# Patient Record
Sex: Female | Born: 1980 | Race: White | Hispanic: No | Marital: Married | Smoking: Current every day smoker
Health system: Southern US, Community
[De-identification: ages and names within clinical notes are randomized; demographics above are authoritative.]

## PROBLEM LIST (undated history)

## (undated) DIAGNOSIS — R51 Headache: Secondary | ICD-10-CM

## (undated) DIAGNOSIS — IMO0002 Reserved for concepts with insufficient information to code with codable children: Secondary | ICD-10-CM

## (undated) DIAGNOSIS — D649 Anemia, unspecified: Secondary | ICD-10-CM

## (undated) DIAGNOSIS — F909 Attention-deficit hyperactivity disorder, unspecified type: Secondary | ICD-10-CM

## (undated) DIAGNOSIS — T4145XA Adverse effect of unspecified anesthetic, initial encounter: Secondary | ICD-10-CM

## (undated) DIAGNOSIS — IMO0001 Reserved for inherently not codable concepts without codable children: Secondary | ICD-10-CM

## (undated) DIAGNOSIS — R87619 Unspecified abnormal cytological findings in specimens from cervix uteri: Secondary | ICD-10-CM

## (undated) DIAGNOSIS — T8859XA Other complications of anesthesia, initial encounter: Secondary | ICD-10-CM

## (undated) DIAGNOSIS — Z5189 Encounter for other specified aftercare: Secondary | ICD-10-CM

## (undated) DIAGNOSIS — G971 Other reaction to spinal and lumbar puncture: Secondary | ICD-10-CM

## (undated) DIAGNOSIS — F419 Anxiety disorder, unspecified: Secondary | ICD-10-CM

## (undated) HISTORY — DX: Headache: R51

## (undated) HISTORY — DX: Other complications of anesthesia, initial encounter: T88.59XA

## (undated) HISTORY — DX: Anemia, unspecified: D64.9

## (undated) HISTORY — DX: Encounter for other specified aftercare: Z51.89

## (undated) HISTORY — DX: Reserved for inherently not codable concepts without codable children: IMO0001

## (undated) HISTORY — DX: Adverse effect of unspecified anesthetic, initial encounter: T41.45XA

## (undated) HISTORY — DX: Attention-deficit hyperactivity disorder, unspecified type: F90.9

## (undated) HISTORY — DX: Other reaction to spinal and lumbar puncture: G97.1

## (undated) HISTORY — DX: Reserved for concepts with insufficient information to code with codable children: IMO0002

## (undated) HISTORY — DX: Anxiety disorder, unspecified: F41.9

## (undated) HISTORY — DX: Unspecified abnormal cytological findings in specimens from cervix uteri: R87.619

---
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
## 2000-07-08 ENCOUNTER — Other Ambulatory Visit: Admission: RE | Admit: 2000-07-08 | Discharge: 2000-07-08 | Payer: Self-pay | Admitting: Obstetrics and Gynecology

## 2002-05-16 ENCOUNTER — Inpatient Hospital Stay (HOSPITAL_COMMUNITY): Admission: EM | Admit: 2002-05-16 | Discharge: 2002-05-18 | Payer: Self-pay | Admitting: Psychiatry

## 2002-06-01 ENCOUNTER — Emergency Department (HOSPITAL_COMMUNITY): Admission: AD | Admit: 2002-06-01 | Discharge: 2002-06-01 | Payer: Self-pay | Admitting: Emergency Medicine

## 2002-06-01 ENCOUNTER — Encounter: Payer: Self-pay | Admitting: Emergency Medicine

## 2006-09-17 ENCOUNTER — Ambulatory Visit: Payer: Self-pay | Admitting: Family Medicine

## 2006-09-17 ENCOUNTER — Encounter (INDEPENDENT_AMBULATORY_CARE_PROVIDER_SITE_OTHER): Payer: Self-pay | Admitting: Family Medicine

## 2006-09-17 LAB — CONVERTED CEMR LAB
Antibody Screen: NEGATIVE
Antibody Screen: NEGATIVE
Basophils Absolute: 0 10*3/uL (ref 0.0–0.1)
Basophils Relative: 0 % (ref 0–1)
Beta hcg, urine, semiquantitative: POSITIVE
Eosinophils Absolute: 0.1 10*3/uL (ref 0.0–0.7)
GC Culture Only: NEGATIVE
HCT: 42.8 % (ref 36.0–46.0)
Hepatitis B Surface Ag: NEGATIVE
Hepatitis B Surface Ag: NEGATIVE
Lymphocytes Relative: 21 % (ref 12–46)
Lymphs Abs: 1.9 10*3/uL (ref 0.7–3.3)
MCHC: 34.3 g/dL (ref 30.0–36.0)
MCV: 93.9 fL (ref 78.0–100.0)
Monocytes Relative: 7 % (ref 3–11)
Neutro Abs: 6.4 10*3/uL (ref 1.7–7.7)
Neutrophils Relative %: 71 % (ref 43–77)
RDW: 13.2 % (ref 11.5–14.0)
Rubella: 123.8 intl units/mL — ABNORMAL HIGH
Urine culture (CF units/mL): NEGATIVE CFunits/mL
WBC: 9 10*3/uL (ref 4.0–10.5)

## 2006-09-19 DIAGNOSIS — F411 Generalized anxiety disorder: Secondary | ICD-10-CM | POA: Insufficient documentation

## 2006-09-19 DIAGNOSIS — F329 Major depressive disorder, single episode, unspecified: Secondary | ICD-10-CM

## 2006-10-01 ENCOUNTER — Encounter (INDEPENDENT_AMBULATORY_CARE_PROVIDER_SITE_OTHER): Payer: Self-pay | Admitting: Family Medicine

## 2006-10-01 ENCOUNTER — Ambulatory Visit (HOSPITAL_COMMUNITY): Admission: RE | Admit: 2006-10-01 | Discharge: 2006-10-01 | Payer: Self-pay | Admitting: Sports Medicine

## 2006-10-21 ENCOUNTER — Telehealth (INDEPENDENT_AMBULATORY_CARE_PROVIDER_SITE_OTHER): Payer: Self-pay | Admitting: Family Medicine

## 2006-11-01 ENCOUNTER — Ambulatory Visit: Payer: Self-pay | Admitting: Family Medicine

## 2006-11-01 LAB — CONVERTED CEMR LAB

## 2006-11-11 ENCOUNTER — Ambulatory Visit: Payer: Self-pay | Admitting: Family Medicine

## 2006-11-11 ENCOUNTER — Telehealth: Payer: Self-pay | Admitting: *Deleted

## 2006-11-11 ENCOUNTER — Encounter (INDEPENDENT_AMBULATORY_CARE_PROVIDER_SITE_OTHER): Payer: Self-pay | Admitting: Family Medicine

## 2006-12-06 ENCOUNTER — Ambulatory Visit (HOSPITAL_COMMUNITY): Admission: RE | Admit: 2006-12-06 | Discharge: 2006-12-06 | Payer: Self-pay | Admitting: Family Medicine

## 2006-12-06 ENCOUNTER — Encounter (INDEPENDENT_AMBULATORY_CARE_PROVIDER_SITE_OTHER): Payer: Self-pay | Admitting: Family Medicine

## 2006-12-23 ENCOUNTER — Ambulatory Visit: Payer: Self-pay | Admitting: Family Medicine

## 2006-12-30 ENCOUNTER — Telehealth (INDEPENDENT_AMBULATORY_CARE_PROVIDER_SITE_OTHER): Payer: Self-pay | Admitting: Family Medicine

## 2007-01-05 ENCOUNTER — Encounter (INDEPENDENT_AMBULATORY_CARE_PROVIDER_SITE_OTHER): Payer: Self-pay | Admitting: Family Medicine

## 2007-01-16 ENCOUNTER — Ambulatory Visit: Payer: Self-pay | Admitting: Family Medicine

## 2007-01-31 ENCOUNTER — Encounter (INDEPENDENT_AMBULATORY_CARE_PROVIDER_SITE_OTHER): Payer: Self-pay | Admitting: *Deleted

## 2007-01-31 ENCOUNTER — Telehealth: Payer: Self-pay | Admitting: *Deleted

## 2007-02-10 ENCOUNTER — Ambulatory Visit: Payer: Self-pay | Admitting: Sports Medicine

## 2007-02-10 LAB — CONVERTED CEMR LAB

## 2007-02-11 ENCOUNTER — Telehealth (INDEPENDENT_AMBULATORY_CARE_PROVIDER_SITE_OTHER): Payer: Self-pay | Admitting: Family Medicine

## 2007-02-12 ENCOUNTER — Encounter (INDEPENDENT_AMBULATORY_CARE_PROVIDER_SITE_OTHER): Payer: Self-pay | Admitting: Family Medicine

## 2007-02-12 ENCOUNTER — Ambulatory Visit: Payer: Self-pay | Admitting: Family Medicine

## 2007-02-12 LAB — CONVERTED CEMR LAB: GTT, 1 hr: 125 mg/dL

## 2007-02-13 ENCOUNTER — Ambulatory Visit (HOSPITAL_COMMUNITY): Admission: RE | Admit: 2007-02-13 | Discharge: 2007-02-13 | Payer: Self-pay | Admitting: Family Medicine

## 2007-02-28 ENCOUNTER — Encounter (INDEPENDENT_AMBULATORY_CARE_PROVIDER_SITE_OTHER): Payer: Self-pay | Admitting: Family Medicine

## 2007-02-28 ENCOUNTER — Ambulatory Visit: Payer: Self-pay | Admitting: Family Medicine

## 2007-02-28 LAB — CONVERTED CEMR LAB: Hemoglobin: 12.5 g/dL

## 2007-03-24 ENCOUNTER — Ambulatory Visit: Payer: Self-pay | Admitting: Family Medicine

## 2007-04-02 ENCOUNTER — Ambulatory Visit: Payer: Self-pay | Admitting: Family Medicine

## 2007-04-09 ENCOUNTER — Inpatient Hospital Stay (HOSPITAL_COMMUNITY): Admission: AD | Admit: 2007-04-09 | Discharge: 2007-04-09 | Payer: Self-pay | Admitting: Obstetrics & Gynecology

## 2007-04-09 ENCOUNTER — Ambulatory Visit: Payer: Self-pay | Admitting: Family Medicine

## 2007-04-09 ENCOUNTER — Ambulatory Visit: Payer: Self-pay | Admitting: Family

## 2007-04-09 ENCOUNTER — Inpatient Hospital Stay (HOSPITAL_COMMUNITY): Admission: AD | Admit: 2007-04-09 | Discharge: 2007-04-11 | Payer: Self-pay | Admitting: Obstetrics & Gynecology

## 2007-04-09 ENCOUNTER — Encounter (INDEPENDENT_AMBULATORY_CARE_PROVIDER_SITE_OTHER): Payer: Self-pay | Admitting: Family Medicine

## 2007-04-09 LAB — CONVERTED CEMR LAB
Chlamydia, Swab/Urine, PCR: NEGATIVE
Glucose, Urine, Semiquant: NEGATIVE

## 2007-04-10 ENCOUNTER — Ambulatory Visit: Payer: Self-pay | Admitting: Psychiatry

## 2007-04-14 ENCOUNTER — Ambulatory Visit: Payer: Self-pay | Admitting: Obstetrics & Gynecology

## 2007-04-17 ENCOUNTER — Ambulatory Visit: Payer: Self-pay | Admitting: Gynecology

## 2007-04-21 ENCOUNTER — Ambulatory Visit: Payer: Self-pay | Admitting: Family Medicine

## 2007-04-23 ENCOUNTER — Ambulatory Visit: Payer: Self-pay | Admitting: Obstetrics and Gynecology

## 2007-04-24 ENCOUNTER — Ambulatory Visit: Payer: Self-pay | Admitting: Gynecology

## 2007-04-24 ENCOUNTER — Inpatient Hospital Stay (HOSPITAL_COMMUNITY): Admission: AD | Admit: 2007-04-24 | Discharge: 2007-04-27 | Payer: Self-pay | Admitting: Obstetrics & Gynecology

## 2007-04-28 ENCOUNTER — Telehealth (INDEPENDENT_AMBULATORY_CARE_PROVIDER_SITE_OTHER): Payer: Self-pay | Admitting: Family Medicine

## 2007-05-02 ENCOUNTER — Ambulatory Visit: Payer: Self-pay | Admitting: Family Medicine

## 2007-05-13 ENCOUNTER — Telehealth: Payer: Self-pay | Admitting: *Deleted

## 2007-05-20 ENCOUNTER — Encounter: Payer: Self-pay | Admitting: *Deleted

## 2007-06-02 ENCOUNTER — Telehealth: Payer: Self-pay | Admitting: *Deleted

## 2007-06-03 ENCOUNTER — Ambulatory Visit: Payer: Self-pay | Admitting: Family Medicine

## 2007-06-05 ENCOUNTER — Telehealth: Payer: Self-pay | Admitting: Psychology

## 2007-06-13 ENCOUNTER — Ambulatory Visit: Payer: Self-pay | Admitting: Family Medicine

## 2007-06-13 DIAGNOSIS — F909 Attention-deficit hyperactivity disorder, unspecified type: Secondary | ICD-10-CM | POA: Insufficient documentation

## 2007-06-17 ENCOUNTER — Ambulatory Visit: Payer: Self-pay | Admitting: Family Medicine

## 2007-06-18 ENCOUNTER — Telehealth (INDEPENDENT_AMBULATORY_CARE_PROVIDER_SITE_OTHER): Payer: Self-pay | Admitting: Family Medicine

## 2007-06-23 ENCOUNTER — Telehealth: Payer: Self-pay | Admitting: Psychology

## 2007-06-27 ENCOUNTER — Ambulatory Visit: Payer: Self-pay | Admitting: Family Medicine

## 2007-07-04 ENCOUNTER — Emergency Department (HOSPITAL_COMMUNITY): Admission: EM | Admit: 2007-07-04 | Discharge: 2007-07-04 | Payer: Self-pay | Admitting: Emergency Medicine

## 2007-07-07 ENCOUNTER — Ambulatory Visit: Payer: Self-pay | Admitting: Psychology

## 2007-07-07 ENCOUNTER — Telehealth (INDEPENDENT_AMBULATORY_CARE_PROVIDER_SITE_OTHER): Payer: Self-pay | Admitting: Family Medicine

## 2007-07-08 ENCOUNTER — Encounter: Payer: Self-pay | Admitting: *Deleted

## 2007-07-10 ENCOUNTER — Encounter (INDEPENDENT_AMBULATORY_CARE_PROVIDER_SITE_OTHER): Payer: Self-pay | Admitting: *Deleted

## 2007-07-28 ENCOUNTER — Telehealth: Payer: Self-pay | Admitting: Psychology

## 2007-09-04 ENCOUNTER — Telehealth: Payer: Self-pay | Admitting: *Deleted

## 2007-09-09 ENCOUNTER — Telehealth (INDEPENDENT_AMBULATORY_CARE_PROVIDER_SITE_OTHER): Payer: Self-pay | Admitting: *Deleted

## 2007-09-15 ENCOUNTER — Ambulatory Visit: Payer: Self-pay | Admitting: Family Medicine

## 2007-09-15 LAB — CONVERTED CEMR LAB: Whiff Test: NEGATIVE

## 2007-12-19 ENCOUNTER — Encounter (INDEPENDENT_AMBULATORY_CARE_PROVIDER_SITE_OTHER): Payer: Self-pay | Admitting: *Deleted

## 2008-07-14 ENCOUNTER — Encounter: Payer: Self-pay | Admitting: *Deleted

## 2008-07-28 ENCOUNTER — Telehealth: Payer: Self-pay | Admitting: Family Medicine

## 2008-09-01 ENCOUNTER — Ambulatory Visit: Payer: Self-pay | Admitting: Family Medicine

## 2008-09-01 DIAGNOSIS — I951 Orthostatic hypotension: Secondary | ICD-10-CM

## 2008-09-01 DIAGNOSIS — H6690 Otitis media, unspecified, unspecified ear: Secondary | ICD-10-CM | POA: Insufficient documentation

## 2008-09-01 DIAGNOSIS — B3749 Other urogenital candidiasis: Secondary | ICD-10-CM | POA: Insufficient documentation

## 2008-09-03 ENCOUNTER — Telehealth: Payer: Self-pay | Admitting: Family Medicine

## 2008-09-13 ENCOUNTER — Telehealth (INDEPENDENT_AMBULATORY_CARE_PROVIDER_SITE_OTHER): Payer: Self-pay | Admitting: *Deleted

## 2008-11-04 ENCOUNTER — Encounter: Payer: Self-pay | Admitting: Family Medicine

## 2008-12-17 ENCOUNTER — Encounter: Payer: Self-pay | Admitting: Family Medicine

## 2009-01-29 HISTORY — PX: TONSILLECTOMY: SUR1361

## 2009-09-12 ENCOUNTER — Encounter: Payer: Self-pay | Admitting: Family Medicine

## 2009-09-12 ENCOUNTER — Ambulatory Visit: Payer: Self-pay | Admitting: Family Medicine

## 2009-09-12 DIAGNOSIS — N920 Excessive and frequent menstruation with regular cycle: Secondary | ICD-10-CM

## 2009-09-12 DIAGNOSIS — N63 Unspecified lump in unspecified breast: Secondary | ICD-10-CM

## 2009-09-12 LAB — CONVERTED CEMR LAB
Hemoglobin: 14.6 g/dL (ref 12.0–15.0)
MCHC: 33.8 g/dL (ref 30.0–36.0)
MCV: 92.3 fL (ref 78.0–100.0)
Platelets: 245 10*3/uL (ref 150–400)
WBC: 12.5 10*3/uL — ABNORMAL HIGH (ref 4.0–10.5)

## 2009-09-19 ENCOUNTER — Telehealth: Payer: Self-pay | Admitting: Family Medicine

## 2009-09-19 ENCOUNTER — Encounter: Admission: RE | Admit: 2009-09-19 | Discharge: 2009-09-19 | Payer: Self-pay | Admitting: Internal Medicine

## 2009-09-26 ENCOUNTER — Encounter: Admission: RE | Admit: 2009-09-26 | Discharge: 2009-09-26 | Payer: Self-pay | Admitting: Internal Medicine

## 2009-10-10 ENCOUNTER — Ambulatory Visit: Payer: Self-pay | Admitting: Family Medicine

## 2009-10-10 DIAGNOSIS — R350 Frequency of micturition: Secondary | ICD-10-CM | POA: Insufficient documentation

## 2009-11-11 ENCOUNTER — Encounter: Payer: Self-pay | Admitting: Family Medicine

## 2010-02-19 ENCOUNTER — Encounter: Payer: Self-pay | Admitting: *Deleted

## 2010-02-20 ENCOUNTER — Encounter: Payer: Self-pay | Admitting: Family Medicine

## 2010-02-21 ENCOUNTER — Telehealth: Payer: Self-pay | Admitting: Family Medicine

## 2010-02-22 ENCOUNTER — Ambulatory Visit: Admission: RE | Admit: 2010-02-22 | Discharge: 2010-02-22 | Payer: Self-pay | Source: Home / Self Care

## 2010-02-22 DIAGNOSIS — K5900 Constipation, unspecified: Secondary | ICD-10-CM | POA: Insufficient documentation

## 2010-02-22 DIAGNOSIS — O039 Complete or unspecified spontaneous abortion without complication: Secondary | ICD-10-CM | POA: Insufficient documentation

## 2010-02-22 DIAGNOSIS — R109 Unspecified abdominal pain: Secondary | ICD-10-CM | POA: Insufficient documentation

## 2010-02-22 DIAGNOSIS — R197 Diarrhea, unspecified: Secondary | ICD-10-CM | POA: Insufficient documentation

## 2010-02-22 LAB — CONVERTED CEMR LAB
Beta hcg, urine, semiquantitative: NEGATIVE
Blood in Urine, dipstick: NEGATIVE
Nitrite: NEGATIVE
Specific Gravity, Urine: 1.02
Urobilinogen, UA: 1
pH: 7.5

## 2010-02-28 NOTE — Miscellaneous (Signed)
Summary: walk in  Clinical Lists Changes walked in with her mom. concerned about a grape sized lump in r breast x 3-4 months. sore at times. no change in size. located near axilla in ruq.  hx aniety disorder per pt. sees a psychiatrist. on Vyvance & alprazolam. wants a referral to md for this. asked if she could get an ultrasound today. told her if md ordered, we may be able to get it done today or tomorrow.  states she has been afrid to come & wants her mom with her. mom is off on mondays. says she has missed appts due to her fear. she got a probation letter & says she will not miss more appts.   also concerned about bleeding gums, unexplained bruising & wt loss. states she has been on a diet & has been exercising. the gums bleed off & on. not always when she brushes her teeth.   also wants to change practices. told her to find another one & sign a ROI so the new md will have her data. she has medicaid.  appt today at 3:15 with Dr. Denyse Amass.Marland KitchenMarland KitchenGolden Circle RN  September 12, 2009 9:27 AM  noted. Jamie Brookes MD  September 12, 2009 8:31 PM

## 2010-02-28 NOTE — Progress Notes (Signed)
Summary: results  Phone Note Call from Patient Call back at Home Phone [phone]   Caller: Patient Summary of Call: pt is wanting to know results of labs Initial call taken by: De Nurse,  September 19, 2009 9:01 AM  Follow-up for Phone Call        will forward to MD. Follow-up by: Theresia Lo RN,  September 19, 2009 10:53 AM  Additional Follow-up for Phone Call Additional follow up Details #1::        she was concerned about platelets. told her they were fine. she wants to know when she will have her gyn appt. states the referral was made but she has not heard back yet. told her sometimes the office we ask to see her may not have anything immediately. assured her we will call when we hear back about day & time Additional Follow-up by: Golden Circle RN,  September 19, 2009 11:23 AM    Additional Follow-up for Phone Call Additional follow up Details #2::    Called left message.  Follow-up by: Clementeen Graham MD,  September 20, 2009 2:25 PM

## 2010-02-28 NOTE — Assessment & Plan Note (Signed)
Summary: anxiety, PREGNANT pos   Vital Signs:  Patient profile:   30 year old female Height:      64 inches Weight:      135 pounds BMI:     23.26 Temp:     98.4 degrees F oral Pulse rate:   103 / minute BP sitting:   113 / 75  (left arm) Cuff size:   regular  Vitals Entered By: Tessie Fass CMA (October 10, 2009 9:04 AM) CC: pregnancy test Pain Assessment Patient in pain? no        Primary Care Mauria Asquith:  Jamie Brookes MD  CC:  pregnancy test.  History of Present Illness: Pregnancy test: Pt is having some symptoms of fatigue and feeling overwhelmed. She has irregular periods and is concerned she may be pregnant. Thinks LMP was the middle of the month. She is havign some hand and fet sweeling in the mornings. She is having some insomnia (especially when she is going through PMS). She had some concerns about a breast lump which [facility] and then had biops[facility]. It was found to be fibrocystic tissue but they may still want to take it out. She's waiting to hear back from those MD's. She is exercising but still feels tired, she has a lot of anxiety and is taking Xanax every 4-6 hours. She is having increased urinary frequency.    Habits & Providers  Alcohol-Tobacco-Diet     Tobacco Status: current  Allergies: 1)  ! Penicillin 2)  ! Keflex  Social History: Smokes 5-10  cigs/day down from over a ppd. quit w/ pregancy.  No alcohol or drugs.  Engaged  with supportive father. Lives w/ her mother.  Her father mysteriously left with family money last year...gambling problemSmoking Status:  current  Review of Systems        vitals reviewed and pertinent negatives and positives seen in HPI   Physical Exam  General:  Well-developed,well-nourished,in no acute distress; alert,appropriate and cooperative throughout examination, appears anxious Lungs:  Normal respiratory effort, chest expands symmetrically. Lungs are clear to auscultation, no  crackles or wheezes. Heart:  Tachycardia, S1 and S2 normal without gallop, murmur, click, rub or other extra sounds. Psych:  pt appears very anxious but has good eye contact   Impression & Recommendations:  Problem # 1:  ANXIETY (ICD-300.00) Assessment Deteriorated Pt appears very anxious today. She is taking xanax quite often. She is seeing and working with her therapist and psychologist.   Orders: [facility]- Est  Level 4 ([address]01)  Her updated medication list for this problem includes:    Alprazolam 1 Mg Tabs (Alprazolam) .Marland Kitchen... 1 mg by mouth three times a day as needed anxiety  Problem # 2:  FREQUENCY, URINARY (ICD-788.41) Assessment: New Pt is urinateing frequenclty and found to be PREGNANT today. She is excited about the news. She already has an appointment with Dr. Silverio Lay, OB/Gyn for thsi week. Encouraged to keep that appointment.    Orders: U Preg-[facility] (81025) [facility]- Est  Level 4 ([address]214)  Complete Medication List: 1)  Alprazolam 1 Mg Tabs (Alprazolam) .Marland Kitchen.. 1 mg by mouth three times a day as needed anxiety 2)  Fluconazole 150 Mg Tabs (Fluconazole) .Marland Kitchen.. 1 tab by mouth, take second pill if not resolving in 4 days 3)  Mucinex 600 Mg Xr12h-tab (Guaifenesin) .... Take one pill every 12hours as needed for congestion. 4)  Azithromycin 500 Mg Tabs (Azithromycin) .... Take on pill daily for 5 days. 5)  Proventil Hfa 108 (90 Base) Mcg/act Aers (Albuterol sulfate) .... Use every 4 hours as needed for wheezing 6)  Vyvanse 50 Mg Caps (Lisdexamfetamine dimesylate) .Marland Kitchen.. 1 per day (may increase to 60 mg) 7)  Naproxen 375 Mg Tabs (Naproxen) .... Taek 1 pill every 8-12 hours. 8)  Hydrocortisone 2.5 % Crea (Hydrocortisone) .... Apply to skin twice a day when having flairs of ezcema.  1 large tube  Patient Instructions: 1)  It was nice to see you again today.  2)  I'm glad your biopsy went well. Please have them send me results. 3)  We are testing you for pregnancy today.   Prescriptions: HYDROCORTISONE 2.5 % CREA (HYDROCORTISONE) apply to skin twice a day when having flairs of ezcema.  1 large tube  #1 x 3   Entered and Authorized by:   Jamie Brookes MD   Signed by:   Jamie Brookes MD on 10/10/2009   Method used:   Electronically to        [facility] [phone]* (retail)       [address]       [address]       [place], [place]  [address]       Ph: [phone]       Fax: [phone]   RxID:   [id number] NAPROXEN 375 MG TABS (NAPROXEN) taek 1 pill every 8-12 hours.  #90 x 6   Entered and Authorized by:   Jamie Brookes MD   Signed by:   Jamie Brookes MD on 10/10/2009   Method used:   Electronically to        [facility] [phone]* (retail)       [address]       [address]       [place], [place]  [address]       Ph: [phone]       Fax: [phone]   RxID:   [id number]   Laboratory Results   Urine Tests  Date/Time Received: October 10, 2009 9:46 AM  Date/Time Reported: October 10, 2009 9:52 AM     Urine HCG: positive Comments: ...............test performed by......Marland KitchenBonnie A. [place], MLS (ASCP)cm

## 2010-02-28 NOTE — Assessment & Plan Note (Signed)
Summary: see notes/[name]/strother   Vital Signs:  Patient profile:   30 year old female Height:      64 inches Weight:      137 pounds BMI:     23.60 Temp:     98.3 degrees F oral BP sitting:   108 / 78  (right arm) Cuff size:   regular  Vitals Entered By: Tessie Fass CMA (September 12, 2009 3:15 PM) CC: lump in right breast Pain Assessment Patient in pain? no        Primary Care Provider:  Jamie Brookes MD  CC:  lump in right breast.  History of Present Illness: Lump in right breast for over 6 months. Not really associated with menstural cycles. Not growing.  Not painful, and no lymphnodes in [place]. Feels Ok otherwise.   Also notes easy brusing and fatigue recently.  Notes heavy cycles and some gum bleeding with teeth cleening intermintently.   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 1 Mg By Mouth Three Times A Day As Needed Anxiety 2)  Adderall 30 Mg Tabs (Amphetamine-Dextroamphetamine) .... May Take Up To 80 Mg Daily As Needed Per Psychiatrist. 3)  Fluconazole 150 Mg  Tabs (Fluconazole) .Marland Kitchen.. 1 Tab By Mouth, Take Second Pill If Not Resolving in 4 Days 4)  Lamictal 200 Mg Tabs (Lamotrigine) .... Take 350 Mg By Mouth Qday 5)  Mucinex 600 Mg Xr12h-Tab (Guaifenesin) .... Take One Pill Every 12hours As Needed For Congestion. 6)  Azithromycin 500 Mg Tabs (Azithromycin) .... Take On Pill Daily For 5 Days. 7)  Proventil Hfa 108 (90 Base) Mcg/act Aers (Albuterol Sulfate) .... Use Every 4 Hours As Needed For Wheezing  Allergies (verified): 1)  ! Penicillin 2)  ! Keflex  Past History:  Past Medical  History: Last updated: 09/17/2006 Anxiety Depression  Family History: Last updated: 09/17/2006 no health problems  Social History: Last updated: 05/02/2007 Smokes 5 cigs/day down from over a ppd. quit w/ pregancy.  No alcohol or drugs.  Engaged  with supportive father. Lives w/ her mother.  Her father mysteriously left with family money last year...gambling problem  Review of Systems  The patient denies anorexia, fever, weight loss, decreased hearing, hoarseness, chest pain, syncope, dyspnea on exertion, peripheral edema, prolonged cough, headaches, abdominal pain, severe indigestion/heartburn, hematuria, genital sores, muscle weakness, depression, unusual weight change, and enlarged lymph nodes.    Physical Exam  General:  Vs noted. Well NAD Mouth:  Oral mucosa and oropharynx without lesions or exudates.  Teeth in good repair. Neck:  No deformities, masses, or tenderness noted. Breasts:  Right lateral breast with rubbery mobile 1cm mass. Non tender. No lymph node adenopathy. Lungs:  Normal respiratory effort, chest expands symmetrically. Lungs are clear to auscultation, no crackles or wheezes. Heart:  Normal rate and regular rhythm. S1 and S2 normal without gallop, murmur, click, rub or other extra sounds. Abdomen:  Bowel sounds positive,abdomen soft and non-tender without masses, organomegaly or hernias noted. Extremities:  Non edemetus BL LE Cervical Nodes:  No lymphadenopathy noted Axillary Nodes:  No palpable lymphadenopathy   Impression & Recommendations:  Problem # 1:  BREAST MASS, RIGHT (  ZOX-096.04) Assessment New Likely fibrocystic changes in young woman with no FH for breast or ovarian cancer.  Conservative plan for ultrasound of breast +/- mamogram and guided biopsy.  Will follow up with PCP.   Orders: Ultrasound (Ultrasound) [facility]- Est Level  3 ([address]98)  Problem # 2:  MENORRHAGIA (ICD-626.2) Assessment: Unchanged ? Von Willibraun's disease.  Will get CBC now to  confirm normal platelet numbers. May work this issue up further.  Will follow up with PCP.   Orders: CBC-[facility] (11914) Gynecologic Referral (Gyn)  Complete Medication List: 1)  Alprazolam 1 Mg Tabs (Alprazolam) .Marland Kitchen.. 1 mg by mouth three times a day as needed anxiety 2)  Adderall 30 Mg Tabs (Amphetamine-dextroamphetamine) .... May take up to 80 mg daily as needed per psychiatrist. 3)  Fluconazole 150 Mg Tabs (Fluconazole) .Marland Kitchen.. 1 tab by mouth, take second pill if not resolving in 4 days 4)  Lamictal 200 Mg Tabs (Lamotrigine) .... Take 350 mg by mouth qday 5)  Mucinex 600 Mg Xr12h-tab (Guaifenesin) .... Take one pill every 12hours as needed for congestion. 6)  Azithromycin 500 Mg Tabs (Azithromycin) .... Take on pill daily for 5 days. 7)  Proventil Hfa 108 (90 Base) Mcg/act Aers (Albuterol sulfate) .... Use every 4 hours as needed for wheezing  Patient Instructions: 1)  Thank you for seeing me today. 2)  Please make a follow up appt with Dr. Clotilde Dieter in 1 month 3)  I will call with test results I will leave a message. 4)  Please get your ultrasound soon.  Prescriptions: PROVENTIL HFA 108 (90 BASE) MCG/ACT AERS (ALBUTEROL SULFATE) use every 4 hours as needed for wheezing  #1 x 0   Entered and Authorized by:   Clementeen Graham MD   Signed by:   Clementeen Graham MD on 09/12/2009   Method used:   Electronically to        [facility] [phone]* (retail)       [address]       [address]       [place], [place]  [address]       Ph: [phone]       Fax: [phone]   RxID:   [id number]

## 2010-02-28 NOTE — Miscellaneous (Signed)
Summary: NPI to [facility]  Clinical Lists Changes gave Angie at above office [phone]) the NPI for her pregnancy care thru delivery & 6 wk check.Marland KitchenMarland KitchenGolden Circle RN  November 11, 2009 11:50 AM

## 2010-03-02 NOTE — Progress Notes (Signed)
Summary: phn msg  Phone Note Call from Patient Call back at Home Phone [phone]   Caller: Patient Summary of Call: wants to go to [facility] for abd pain- has an appt tomorrow with them but since she has medicaid, she needs NPI.  told her that we could get her in today or tomorrow here, but she is insisiting that she go there. Initial call taken by: De Nurse,  February 21, 2010 9:03 AM  Follow-up for Phone Call        will forward message to MD to give OK for this referral. Follow-up by: Theresia Lo RN,  February 21, 2010 9:32 AM  Additional Follow-up for Phone Call Additional follow up Details #1::        pt called back and made appt with [place] Balthazar for tomorrow afternoon Additional Follow-up by: De Nurse,  February 21, 2010 9:35 AM    Additional Follow-up for Phone Call Additional follow up Details #2::    will be happy to see her.  Follow-up by: Jamie Brookes MD,  February 21, 2010 2:28 PM

## 2010-03-06 ENCOUNTER — Telehealth: Payer: Self-pay | Admitting: Family Medicine

## 2010-03-08 NOTE — Assessment & Plan Note (Signed)
Summary: abd pain,df   Vital Signs:  Patient profile:   30 year old female Height:      64 inches Weight:      146.7 pounds BMI:     25.27 Temp:     97.7 degrees F oral Pulse rate:   82 / minute BP sitting:   107 / 69  (left arm) Cuff size:   regular  Vitals Entered By: Jimmy Footman, CMA (February 22, 2010 3:31 PM) CC: abdominal pai Is Patient Diabetic? No   Primary Care Provider:  Jamie Brookes MD  CC:  abdominal pai.  History of Present Illness: Abd Pain: Pt has had some abdominal pain. She recently had a miscarriage and was being seen by Dr. Estanislado Pandy (Ob/Gyn). She was told that she is doing well after the miscarriage and can go back to everyday normal activity. She was playing with her daughter 3 days ago as she always does and her daughter was jumping on her stomach. She noted some left sided pain after this but says she doesn't think it caused it because they always play like that and it has never caused pain. She also notes some changing bowel habits. She is concerned about IBS.   Habits & Providers  Alcohol-Tobacco-Diet     Tobacco Status: quit  Current Medications (verified): 1)  Alprazolam 1 Mg  Tabs (Alprazolam) .Marland Kitchen.. 1 Mg By Mouth Three Times A Day As Needed Anxiety 2)  Fluconazole 150 Mg  Tabs (Fluconazole) .Marland Kitchen.. 1 Tab By Mouth, Take Second Pill If Not Resolving in 4 Days 3)  Mucinex 600 Mg Xr12h-Tab (Guaifenesin) .... Take One Pill Every 12hours As Needed For Congestion. 4)  Azithromycin 500 Mg Tabs (Azithromycin) .... Take On Pill Daily For 5 Days. 5)  Proventil Hfa 108 (90 Base) Mcg/act Aers (Albuterol Sulfate) .... Use Every 4 Hours As Needed For Wheezing 6)  Vyvanse 50 Mg Caps (Lisdexamfetamine Dimesylate) .Marland Kitchen.. 1 Per Day (May Increase To 60 Mg) 7)  Naproxen 375 Mg Tabs (Naproxen) .... Taek 1 Pill Every 8-12 Hours. 8)  Hydrocortisone 2.5 % Crea (Hydrocortisone) .... Apply To Skin Twice A Day When Having Flairs of Ezcema.  1 Large Tube  Allergies (verified): 1)  !  Penicillin 2)  ! Keflex  Social History: Smoking Status:  quit  Review of Systems       pt notes that she feels like her feet and hands are swollen.    Physical Exam  General:  Well-developed,well-nourished,in no acute distress; alert,appropriate and cooperative throughout examination Abdomen:  soft, normal bowel sounds, no distention, and no masses.  Pt did not have significant area of focal tenderness, some diffuse tenderness. No bruising.  Msk:  no swelling noted in hand or feet.  Psych:  moderately anxious.     Impression & Recommendations:  Problem # 1:  ABDOMINAL PAIN, LOWER (ICD-789.09) Assessment New Pt has a UA that is neg. This may be IBS pain. Will refer to GI per pt request.   Her updated medication list for this problem includes:    Naproxen 375 Mg Tabs (Naproxen) .Marland Kitchen... Taek 1 pill every 8-12 hours.  Orders: Urinalysis-[facility] (00000) U Preg-[facility] (04540) Gastroenterology Referral (GI) [facility]- Est Level  3 (98119)  Problem # 2:  CONSTIPATION (ICD-564.00) Assessment: Comment Only Pt ha constipation sometimes and sometimes diarrhea. Likely related to iBS.   Orders: Gastroenterology Referral (GI) [facility]- Est Level  3 (14782)  Problem # 3:  DIARRHEA (ICD-787.91) Assessment: Comment Only Pt sometimes has diarrhea. Likely related  to  IBS. Will refer to GI.   Orders: Gastroenterology Referral (GI) [facility]- Est Level  3 (04540)  Problem # 4:  UNSPEC SPONTANEOUS AB WITHOUT MENTION COMP (ICD-634.90) Assessment: Unchanged PT had recent pregnancy loss. But is doing well and was under the care of Dr. Estanislado Pandy.  Pt had neg Upreg here today. She would like to go back to her Dr. Estanislado Pandy Ob/Gyn but needs an official referral to continue seeing her. Done today.   Orders: Obstetric Referral (Obstetric) [facility]- Est Level  3 (98119)  Complete Medication List: 1)  Alprazolam 1 Mg Tabs (Alprazolam) .Marland Kitchen.. 1 mg by mouth three times a day as needed anxiety 2)  Fluconazole 150 Mg Tabs  (Fluconazole) .Marland Kitchen.. 1 tab by mouth, take second pill if not resolving in 4 days 3)  Mucinex 600 Mg Xr12h-tab (Guaifenesin) .... Take one pill every 12hours as needed for congestion. 4)  Azithromycin 500 Mg Tabs (Azithromycin) .... Take on pill daily for 5 days. 5)  Proventil Hfa 108 (90 Base) Mcg/act Aers (Albuterol sulfate) .... Use every 4 hours as needed for wheezing 6)  Vyvanse 50 Mg Caps (Lisdexamfetamine dimesylate) .Marland Kitchen.. 1 per day (may increase to 60 mg) 7)  Naproxen 375 Mg Tabs (Naproxen) .... Taek 1 pill every 8-12 hours. 8)  Hydrocortisone 2.5 % Crea (Hydrocortisone) .... Apply to skin twice a day when having flairs of ezcema.  1 large tube  Patient Instructions: 1)  I have sent in a referral for your OB/Gyn doctor.  2)  I will call you with results of your urine tests.    Orders Added: 1)  Urinalysis-[facility] [00000] 2)  U Preg-[facility] [81025] 3)  Obstetric Referral [Obstetric] 4)  Gastroenterology Referral [GI] 5)  [facility]- Est Level  3 [99213]    Laboratory Results   Urine Tests  Date/Time Received: February 22, 2010 3:57 PM  Date/Time Reported: February 22, 2010 5:30 PM   Routine Urinalysis   Color: yellow Appearance: Clear Glucose: negative   (Normal Range: Negative) Bilirubin: small   (Normal Range: Negative) Ketone: trace (5)   (Normal Range: Negative) Spec. Gravity: 1.020   (Normal Range: 1.003-1.035) Blood: negative   (Normal Range: Negative) pH: 7.5   (Normal Range: 5.0-8.0) Protein: 100   (Normal Range: Negative) Urobilinogen: 1.0   (Normal Range: 0-1) Nitrite: negative   (Normal Range: Negative) Leukocyte Esterace: negative   (Normal Range: Negative)    Urine HCG: negative Comments: micro not done,  bilirubin not confirmed ...............test performed by......Marland KitchenMajel Homer MD

## 2010-03-16 NOTE — Progress Notes (Signed)
Summary: [facility] appointment  Phone Note From Other Clinic   Caller: [facility] Summary of Call: called to say that patient [facility] for today Initial call taken by: De Nurse,  March 06, 2010 11:24 AM  Follow-up for Phone Call        I noted it in the referral section.  Follow-up by: Jamie Brookes MD,  March 06, 2010 12:15 PM

## 2010-06-13 NOTE — Consult Note (Signed)
NAME:  [name], LOUD              ACCOUNT NO.:  [id number]   MEDICAL RECORD NO.:  [id number]          PATIENT TYPE:  INP   LOCATION:  9157                          FACILITY:  [facility]   PHYSICIAN:  Anselm Jungling, MD  DATE OF BIRTH:  [birth year]   DATE OF CONSULTATION:  04/10/2007  DATE OF DISCHARGE:                                 CONSULTATION   IDENTIFYING DATA AND REASON FOR REFERRAL:  The patient is a 30 year old  Caucasian woman who is currently [redacted] weeks pregnant.  She is admitted to  [facility] because of complications related to her pregnancy.  There is apparently some need for observation, 24-hour urine collection,  and monitoring.  In the course of her evaluation and assessment here,  the patient has exhibited some anxiety, and it is come out that she has  a reported history of bipolar disorder.  A psychiatric consultation is  requested to review all of this, examine the patient and make  recommendations.   HISTORY OF THE PRESENTING PROBLEMS:  The patient herself is the source  of most of the following information.  She appears to be a good  historian.  The patient is 30 years old, and indicates that she has  reached her 36th week of gestation today.  She indicates that when she  was younger, she was diagnosed with various psychiatric problems  including bipolar disorder, ADHD.  In addition, older records indicate  that she has a history of substance abuse, and eating disorder.   On the electronic medical record, I find records pertaining to an  inpatient psychiatric stay here at [facility] that  occurred in April 2004, about 5 years ago, when the patient was 73.  At  that time she was admitted for 3 days due to increasing depression and  suicidal ideation.  The patient identified herself as having bipolar  disorder, but it was something that she designated herself with, after  researching it on the Internet.  she was not diagnosed with bipolar  disorder  during that hospital stay.  She was seen as having a  nonspecific substance abuse disorder, a nonspecific eating disorder, and  borderline personality disorder.  Nonetheless, she has been treated with  a variety psychotropic medications over time including Lexapro, Xanax,  and Adderall.   The patient tells me, with some pride, that she has gotten off of all of  those medications, and no longer uses any alcohol, drugs, and no longer  smokes.  She is very proud of herself.  She states that she has done  well without all of these medications, and feels that they had a lot of  negative and deleterious effects when she was using them.  She states  now they think that maybe I do not even have bipolar.   She does acknowledge some panic attacks which do not sound by  description like full-blown panic attacks, but rather episodes of  anxiety.  Earlier today, she was given a p.r.n. dose of Vistaril 50 mg.  The patient states that this helped a lot.Marland Kitchen   PAST PSYCHIATRIC HISTORY:  As above.   MEDICAL HISTORY:  Please refer to OB/GYN evaluation.   MENTAL STATUS AND OBSERVATIONS:  The patient is a well-nourished, well-  developed, young adult female who is resting comfortably in her hospital  bed.  She is alert, fully oriented, and well-organized.  She states that  she knew that a psychiatrist was coming, and is gracious about speaking  with me.  She is quite open and unguarded and appears to be a good  historian. She is a least average intelligence.   She shares her opinion with me that I am probably going to have some  more episodes of anxiety.  However, she indicates that this is  something that she feels she can take in stride.  She indicates quite  clearly that she does not want to necessarily be treated with much if  anything in the way of medication for this.  She states you know Xanax  can be  very addictive.  She goes on to tell me about the negative  effects that she feels the past  psychotropic medications have had,  including dental problems that she feels are the result of having used  Adderall.   Thoughts and speech are normally organized.  There is nothing to suggest  any underlying thought disorder, psychosis, or delusionality. Her mood  appears to be neutral, with appropriate affect.  Her level of insight  and judgment appear to be quite good.  It appears that she is looking  forward to giving birth to the daughter, whom she has already named.  She indicates that she is living with her mother, who sounds like she is  a solid support.   IMPRESSION:  The patient is a 30 year old woman who is currently [redacted]  weeks pregnant.  Based upon my review of the records and my discussion  with the patient, I do not feel that there is any convincing evidence  that she has, or ever had a true bipolar disorder.  She apparently has  had some issues with substance abuse and binging and purging in the  past, but those problems do not appear to be in evidence at this time.  When she was hospitalized at [facility] psychiatrically 5 years ago, she  was felt to have an unspecified mood disorder, but with an AXIS II  specifier of borderline personality disorder.  She has apparently done  quite well in her life in recent years without psychotropic medications.   I believe that the brief periods of anxiety she is experiencing  currently are not necessarily indicative of any diagnosable psychiatric  disorder.  The patient herself prefers to not characterize these  episodes as indicative of a psychiatric disturbance.  She appears to  have a fairly mature, matter-of-fact view towards her current episodes  of anxiety.  She is grateful for some relief from Vistaril, but does not  appear to be seeking a medication solution to this and indicates healthy  tolerance for a certain amount of anxiety that she may experience.   DIAGNOSTIC IMPRESSION:  AXIS I:  Adjustment disorder with mild  anxious  features.  AXIS II:  Deferred.  AXIS III:  [redacted] weeks gestation.  AXIS IV:  Stressors moderate.  AXIS V:  GAF 85.   RECOMMENDATIONS:  I feel it is appropriate to continue p.r.n. Vistaril  for mild episodes of anxiety that she is experiencing.  The patient also  tells me that Ambien is being considered for sleep and this would appear  to be reasonable as well.   I do not think that she needs referral to any outside psychiatric or  mental health  providers at this time, based upon what I have learned  about her to day.   Thank you for involving me in this patient's care.  Please do not  hesitate to contact me if further input is needed.      Anselm Jungling, MD  Electronically Signed     SPB/MEDQ  D:  04/10/2007  T:  04/10/2007  Job:  [id number]

## 2010-06-13 NOTE — Discharge Summary (Signed)
[name], [name]              ACCOUNT NO.:  [id number]   Brittney RECORD NO.:  [id number]          PATIENT TYPE:  INP   LOCATION:  9147                          FACILITY:  [facility]   PHYSICIAN:  Allie Bossier, MD        DATE OF BIRTH:  April 03, 1980   DATE OF ADMISSION:  04/24/2007  DATE OF DISCHARGE:  04/27/2007                               DISCHARGE SUMMARY   DISCHARGE MEDICATIONS:  1. Ibuprofen 600 mg p.o. q.6 h. p.r.n.  2. Colace 100 mg p.o. b.i.d.  3. Dermaflex spray use as directed.  4. ProctoFoam use as directed.  5. Prenatal vitamins 1 tablet p.o. daily while breast-feeding.  6. Fioricet as prescribed per anesthesia.   DISCHARGE DIAGNOSES:  1. Term pregnancy.  2. Postpartum day #2.  3. Anxiety.  4. Complications with spinal anesthesia.   HOSPITAL SUMMARY:  This is a 30 year old female G2, P60, who presented at  62 weeks' gestation, who was called from [name] after having  more than 300 mg of protein in her urine and elevated liver enzymes.  The patient had been seen at the [name] for presumed preeclampsia.  On  the April 24, 2007, abnormal labs were found and the patient returned to  be induced for preeclampsia.  The patient did well.  During the hospital  stay, she was placed on magnesium sulfate.  Liver enzymes were  rechecked.  The patient tolerated magnesium sulfate and had no  complications with this.  Initially, Cytotec was placed and then a Foley  bulb to assist with induction.  The patient was then started on Pitocin  in the morning of the April 25, 2007.  The patient delivered on April 25, 2007, without any complications with the exception of a second-  degree laceration.  A healthy baby girl was born with Apgars 9 and 9.  A  3-cord vessel, normal placenta.  Laceration repaired with 3-0 Vicryl.  Mother and baby healthy.  Please note that the anesthesia used was  intended to be an  epidural; however, it was then said is spinal  anesthesia.  Initially, the patient had some complications with losing  feeling from the neck down; however, she had no respiratory issues.  The  spinal anesthesia was readjusted and the patient tolerated the remainder  of her labor with the spinal anesthesia.  Postpartum, the patient was  continued on magnesium sulfate for 24 hours and then she was transferred  to the floor.  Her blood pressures stayed in the 130s/80s to 90s.  On  the day of discharge which was April 27, 2007, the patient was noted to  have a severe headache that was positional.  It only occurred while the  patient was sitting up and disappeared completely when she was lying  down, it was felt to be secondary to her spinal anesthesia.  Anesthesiology was consulted who offered to patch the patient; however,  she refused this.  Instead, they gave her Fioricet.  The patient agreed  to this plan.  Of note, during the hospital, the patient was found to be  anemic with hemoglobin down to 7.7; however, there was no obvious blood  loss and the patient remained asymptomatic.  No transfusion needed,  although the patient was typed and crossed.   PERTINENT LABORATORY DATA:  On admission, comprehensive metabolic panel  showed liver enzymes; AST 58, ALT 104, urine over 300 mg/dL of protein.  CBC:  Prepartum hemoglobin 13, postpartum 8.1, and platelets remained  normal at 198.  RPR negative.   FOLLOWUP:  The patient has to follow up with Dr. Karn Pickler at the [facility] in 1 week.  As the patient has a history of anxiety and  depression, I felt close followup was warranted.  She will then be  continued to be followed.  Baby Love Nurse will check on his blood  pressure at the house.      Johney Maine, M.D.      Allie Bossier, MD  Electronically Signed    JT/MEDQ  D:  05/03/2007  T:  05/03/2007  Job:  [id number]

## 2010-06-13 NOTE — Discharge Summary (Signed)
[name], [name]              ACCOUNT NO.:  [id number]   MEDICAL RECORD NO.:  [id number]         PATIENT TYPE:  INP   LOCATION:  9157                          FACILITY:  [facility]   PHYSICIAN:  Norton Blizzard, MD    DATE OF BIRTH:  [birth year]   DATE OF ADMISSION:  04/09/2007  DATE OF DISCHARGE:  04/11/2007                               DISCHARGE SUMMARY   ADMISSION DIAGNOSES:  1. Intrauterine pregnancy at 35 weeks' and 6 days gestation.  2. Proteinuria.  3. Elevated ALT.  4. Bilateral lower extremity edema.  5. Borderline low platelets.  6. Assessment for preeclampsia.  7. Anxiety   DISCHARGE DIAGNOSES:  1. Intrauterine pregnancy at 36 weeks and 1 day.  2. Gestational hypertension.  3. Proteinuria.  4. Mildly elevated ALT improved from admission.  5. Group B streptococcus, unknown.  6. Low platelets.  7. Anxiety.   PROCEDURES:  The patient had an ultrasound performed on April 10, 2007,  showing a single gestation, cephalic presentation, placenta was anterior  above the cervical os.  Amniotic fluid index was 17.06 in the 63rd  percentile.  Estimated fetal weight was 3044 grams in the 79th  percentile.  No late developing fetal anatomical abnormalities were  seen.   COMPLICATIONS:  None.   CONSULTATIONS:  Psychiatry - Dr Electa Sniff   PERTINENT LABORATORY FINDINGS:  Upon admission, [name] had a CBC  showing white blood cell count of 11.3, hemoglobin  13.4, hematocrit  37.8, and platelets 164.  Urinalysis had 100 of protein, 15 ketones  small, and bilirubin negative for nitrites and leukocyte esterase.  Complete metabolic panel, sodium was 134, potassium 3.5, BUN 5,  creatinine 0.46, glucose was 77, AST was 34, ALT was 61, and alkaline  phosphatase 145.  Reminder of labs were within normal limits.  Admission  LDH was 163 and uric acid was 6.1.  She had a 24-hour urine during her  stay with urine volume of 825 mL.  A 24-hour urine protein of 264.  Creatinine clearance of  159.  A 24-hour urine creatinine of 1165.  On  the day of discharge, her CBC showed a white blood cell count of 7.9,  hemoglobin 12.2, hematocrit 34.9, and platelets 146.  Complete metabolic  panel, sodium was 140, potassium 3.3, creatinine 0.47, alkaline  phosphatase 123, AST was 27, ALT was 55, LDH was 132, and uric acid was  5.5.   BRIEF/PERTINENT ADMISSION HISTORY:  [name] is a 30 year old gravida  3, para 0-0-2-0 at 35 weeks and 6 days gestation with history of 2  therapeutic abortions.  She presents complaining of elevated blood  pressures.  She also reports 5-pound weight gain in a week.  There was  no headache, vision changes, or right upper quadrant epigastric pain.  Blood pressure in MAU was 123/83 followed by 130/80.  She was noted to  have normal reflexes, no clonus and bilateral 2+ pitting edema in her  lower extremities.  Platelets were found to be 164, which were in a low  normal range.  ALT was elevated at 61.   HOSPITAL COURSE:  The  patient was admitted for evaluation of possible  preeclampsia due to reported history of weight gain and elevated blood  pressures.  The blood pressures were normal here.  She also had an  elevated ALT and mildly low borderline platelets.  The patient was  admitted to antenatal and 24-hour urine for protein, creatinine was  begun.  Ultrasound for growth and AFI was also ordered.  Ultrasound  reports as stated above with estimated fetal weight in the 79th  percentile and AFI of 17.  She was monitored with NSTs, which were  reactive throughout her stay.  A 24-hour urine protein was 264 mg, and  she had no signs or symptoms throughout her stay.  She had 3+ deep  tendon reflexes without clonus, and is otherwise asymptomatic.  She is  to be discharged home with followup in [facility] for twice weekly  testing. The patient was also assessed by psychiatry due to the history  of bipolar disorder and tearful episode upon admission.  She  was  diagnosed with episodes of anxiety and counseled to use Vistaril p.r.n.   DISCHARGE STATUS:  Stable.   DISCHARGE MEDICATIONS:  1. Prenatal vitamins 1 tablet p.o. daily, as she was taking prior to      admission.  2. Vistaril 50 mg one PO evert 6-8 hours as needed for anxiety   DISCHARGE INSTRUCTIONS:  1. Discharged to home.  2. Regular diet.  3. Activity is to be limited to using the bathroom, showering, and      being up to eat.  No restrictions on sexual activity.  4. The patient is to follow up in [facility], an appointment has      been made for April 14, 2007, at 7:30 a.m. for an NST and a visit      with physician.      Karlton Lemon, MD      Norton Blizzard, MD  Electronically Signed    NS/MEDQ  D:  04/11/2007  T:  04/12/2007  Job:  [id number]   cc:   Johney Maine, M.D.
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
## 2010-06-28 ENCOUNTER — Encounter: Payer: Self-pay | Admitting: Family Medicine

## 2010-06-28 ENCOUNTER — Telehealth: Payer: Self-pay | Admitting: Family Medicine

## 2010-06-28 ENCOUNTER — Ambulatory Visit (INDEPENDENT_AMBULATORY_CARE_PROVIDER_SITE_OTHER): Payer: Medicaid Other | Admitting: Family Medicine

## 2010-06-28 VITALS — BP 110/79 | HR 85 | Temp 97.9°F | Ht 65.0 in | Wt 134.0 lb

## 2010-06-28 DIAGNOSIS — N912 Amenorrhea, unspecified: Secondary | ICD-10-CM

## 2010-06-28 DIAGNOSIS — G4700 Insomnia, unspecified: Secondary | ICD-10-CM | POA: Insufficient documentation

## 2010-06-28 DIAGNOSIS — J4520 Mild intermittent asthma, uncomplicated: Secondary | ICD-10-CM | POA: Insufficient documentation

## 2010-06-28 DIAGNOSIS — Z309 Encounter for contraceptive management, unspecified: Secondary | ICD-10-CM

## 2010-06-28 DIAGNOSIS — J45909 Unspecified asthma, uncomplicated: Secondary | ICD-10-CM | POA: Insufficient documentation

## 2010-06-28 LAB — POCT URINE PREGNANCY: Preg Test, Ur: POSITIVE

## 2010-06-28 MED ORDER — CLONAZEPAM 1 MG PO TABS: 1.0000 mg | ORAL_TABLET | Freq: Three times a day (TID) | ORAL | Status: DC

## 2010-06-28 MED ORDER — ALBUTEROL SULFATE HFA 108 (90 BASE) MCG/ACT IN AERS: 1.0000 | INHALATION_SPRAY | RESPIRATORY_TRACT | Status: DC | PRN

## 2010-06-28 MED ORDER — ZOLPIDEM TARTRATE 5 MG PO TABS: 5.0000 mg | ORAL_TABLET | Freq: Every evening | ORAL | Status: DC | PRN

## 2010-06-28 NOTE — Progress Notes (Signed)
Addended by: Jamie Brookes B on: 06/28/2010 05:05 PM   Modules accepted: Orders

## 2010-06-28 NOTE — Assessment & Plan Note (Signed)
PT does not want to be pregnant. Has an unstable relationship at this time.  Plans to have an abortion. Given resources today.  Upreg was positive.

## 2010-06-28 NOTE — Telephone Encounter (Signed)
Pt calling to let MD know she has a pregnancy termination scheduled this Monday at 3:00, they have requested she f/u with obgyn for an ultrasound within 3 days, pt wants to know if we can do the referral? She has been seen at  [facility] in the past.

## 2010-06-28 NOTE — Patient Instructions (Signed)
You are pregnant.  Based on your LMP of 05-20-10 you are 5 weeks and 3 days pregnant.  You have been given info about places to terminate a pregnancy.  I refilled your albuterol and gave you a Rx for ambien.  Use it only at night.

## 2010-06-28 NOTE — Telephone Encounter (Signed)
I put in the order for the [place] [facility]. If she prefers to go the [facility] let me know and I will put it in there. She should be able to get pregnancy mediciad to cover the ultrasound. I am in [place] clinic on Thursday so just come find me if we can't get her in quickly at the [facility]. Thanks for working on this so quickly.

## 2010-06-28 NOTE — Progress Notes (Signed)
Amenorrhea: Pt has no had her period since 05-20-10 and took a home pregnancy test that was positive. She is here today to confirm the pregnancy and to discuss termination. She and her fiance are separated right now because he has started drinking again. He just got out of rehab. She is living with her mom and her daughter at her mom's house. She recently had a miscarriage and had not gotten back on birth control and is now pregnant again. She says it is just not a good time to be pregnant since she isn't sure where the relationship is going. She hopes they will work it out. According to her dates she is 5 weeks and 3 days today.   Insomnia: Pt says that she has not slept in 3 days. She is not sleeping well despite being on Klonopin. Pt has tried melatonin. Wants to discuss a sleeping pill. Has used ambien in the past while she was pregnant with her daughter Aileen Pilot.   ROS: some dizziness and bruising and some fainting spells. She has a referral to neurology for this already and has been working with her [facility] doctor about this.   PE:  Gen: Pt is tearful Pscyh: Pt is anxious and tearful. She appears to be rational in her thinking.

## 2010-06-29 ENCOUNTER — Telehealth: Payer: Self-pay | Admitting: *Deleted

## 2010-06-29 ENCOUNTER — Other Ambulatory Visit: Payer: Self-pay | Admitting: Family Medicine

## 2010-06-29 DIAGNOSIS — O3680X0 Pregnancy with inconclusive fetal viability, not applicable or unspecified: Secondary | ICD-10-CM

## 2010-06-29 DIAGNOSIS — N912 Amenorrhea, unspecified: Secondary | ICD-10-CM

## 2010-06-29 NOTE — Telephone Encounter (Signed)
Spoke with [facility] and gave information. I was informed that request has to be sent back to nurse and then it will be set up. Gave phone number of patient so she can be contacted. Also gave my extension if needing more information or if not able to reach patient.

## 2010-06-29 NOTE — Telephone Encounter (Signed)
Ok, I put in the referral for the Ob/Gyn. Please set it up. Thanks.

## 2010-06-30 ENCOUNTER — Ambulatory Visit (HOSPITAL_COMMUNITY): Admission: RE | Admit: 2010-06-30 | Payer: Medicaid Other | Source: Ambulatory Visit

## 2010-06-30 ENCOUNTER — Other Ambulatory Visit (HOSPITAL_COMMUNITY): Payer: Medicaid Other

## 2010-06-30 NOTE — Telephone Encounter (Signed)
Spoke with patient and she informed me that she also received call from [facility] office.[name]

## 2010-06-30 NOTE — Telephone Encounter (Signed)
Set up appointment for 07/06/2010 @ 9:30am @ [facility] Appointment is set up with the PA, and I was informed by the nurse that the doctor is the one to decide if the [place] is necessary. The office was informed of why [place] was requested.

## 2010-07-03 NOTE — Telephone Encounter (Signed)
Spoke with patient.

## 2010-10-23 LAB — CBC
HCT: 34.9 — ABNORMAL LOW
HCT: 37.8
HCT: 21.5 — ABNORMAL LOW
HCT: 23.3 — ABNORMAL LOW
Hemoglobin: 13.4
Hemoglobin: 7.7 — CL
Hemoglobin: 8.1 — ABNORMAL LOW
MCHC: 35.3
MCHC: 35.1
MCHC: 35.9
MCV: 89.9
MCV: 90.5
MCV: 89.3
Platelets: 146 — ABNORMAL LOW
Platelets: 198
RBC: 4.2
RBC: 4.37
RBC: 2.41 — ABNORMAL LOW
RBC: 2.59 — ABNORMAL LOW
RBC: 4.15
RDW: 12.9
RDW: 12.8
RDW: 13
WBC: 7.9
WBC: 9.9
WBC: 10.8 — ABNORMAL HIGH

## 2010-10-23 LAB — COMPREHENSIVE METABOLIC PANEL
ALT: 66 — ABNORMAL HIGH
ALT: 104 — ABNORMAL HIGH
AST: 36
AST: 58 — ABNORMAL HIGH
Albumin: 2 — ABNORMAL LOW
Albumin: 2.5 — ABNORMAL LOW
Albumin: 2.7 — ABNORMAL LOW
Alkaline Phosphatase: 142 — ABNORMAL HIGH
BUN: 3 — ABNORMAL LOW
BUN: 5 — ABNORMAL LOW
CO2: 20
CO2: 24
CO2: 25
CO2: 22
Calcium: 8.5
Calcium: 9.1
Chloride: 104
Chloride: 107
Chloride: 111
Chloride: 108
Creatinine, Ser: 0.46
Creatinine, Ser: 0.47
GFR calc Af Amer: >60
GFR calc Af Amer: >60
GFR calc non Af Amer: >60
GFR calc non Af Amer: >60
GFR calc non Af Amer: >60
GFR calc non Af Amer: >60
Glucose, Bld: 83
Sodium: 138
Sodium: 138
Total Bilirubin: 0.5
Total Bilirubin: 0.7
Total Bilirubin: 0.9

## 2010-10-23 LAB — URINALYSIS, ROUTINE W REFLEX MICROSCOPIC
Hgb urine dipstick: NEGATIVE
Nitrite: NEGATIVE
Protein, ur: 100 — AB
Specific Gravity, Urine: >1.03 — ABNORMAL HIGH
Urobilinogen, UA: 0.2

## 2010-10-23 LAB — TYPE AND SCREEN: Antibody Screen: NEGATIVE

## 2010-10-23 LAB — BASIC METABOLIC PANEL
CO2: 22
Chloride: 105
GFR calc Af Amer: >60
Glucose, Bld: 81
Potassium: 3.4 — ABNORMAL LOW
Sodium: 135

## 2010-10-23 LAB — DIFFERENTIAL
Basophils Absolute: 0
Basophils Absolute: 0
Basophils Absolute: 0
Basophils Relative: 0
Eosinophils Absolute: 0.1
Eosinophils Relative: 1
Eosinophils Relative: 1
Lymphocytes Relative: 24
Monocytes Absolute: 0.7
Monocytes Relative: 6
Neutro Abs: 5.3
Neutro Abs: 8.8 — ABNORMAL HIGH
Neutrophils Relative %: 66

## 2010-10-23 LAB — POCT URINALYSIS DIP (DEVICE)
Glucose, UA: NEGATIVE
Glucose, UA: NEGATIVE
Nitrite: NEGATIVE
Nitrite: NEGATIVE
Operator id: 194561
Operator id: 297281
Protein, ur: >=300 — AB
Urobilinogen, UA: 0.2
Urobilinogen, UA: 0.2

## 2010-10-23 LAB — LACTATE DEHYDROGENASE
LDH: 132
LDH: 163

## 2010-10-23 LAB — CREATININE CLEARANCE, URINE, 24 HOUR
Creatinine Clearance: 159 — ABNORMAL HIGH
Creatinine, Urine: 141.2
Creatinine: 0.51

## 2010-10-23 LAB — URINE MICROSCOPIC-ADD ON

## 2010-10-23 LAB — URIC ACID
Uric Acid, Serum: 5.4
Uric Acid, Serum: 5.5

## 2010-10-23 LAB — PROTEIN, URINE, 24 HOUR: Urine Total Volume-UPROT: 825

## 2010-10-26 LAB — CBC
HCT: 39.6
MCHC: 33.7
MCV: 86.9
Platelets: 206
WBC: 6.8

## 2010-10-26 LAB — DIFFERENTIAL
Basophils Relative: 0
Eosinophils Absolute: 0.2
Eosinophils Relative: 2
Lymphs Abs: 1.9
Monocytes Relative: 6
Neutrophils Relative %: 63

## 2010-10-26 LAB — POCT I-STAT, CHEM 8
Calcium, Ion: 1.12
Creatinine, Ser: 0.9
Glucose, Bld: 91
HCT: 40
Hemoglobin: 13.6
TCO2: 23

## 2010-10-26 LAB — URINALYSIS, ROUTINE W REFLEX MICROSCOPIC
Nitrite: NEGATIVE
Protein, ur: NEGATIVE
Specific Gravity, Urine: 1.01
Urobilinogen, UA: 0.2

## 2010-10-26 LAB — WET PREP, GENITAL
Clue Cells Wet Prep HPF POC: NONE SEEN
Trich, Wet Prep: NONE SEEN

## 2010-10-26 LAB — GC/CHLAMYDIA PROBE AMP, GENITAL
Chlamydia, DNA Probe: NEGATIVE
GC Probe Amp, Genital: NEGATIVE

## 2010-10-26 LAB — POCT PREGNANCY, URINE: Preg Test, Ur: NEGATIVE

## 2010-10-26 LAB — URINE MICROSCOPIC-ADD ON

## 2011-01-30 NOTE — L&D Delivery Note (Signed)
Delivery Note At 11:58 PM a viable female was delivered via Vaginal, Spontaneous Delivery (Presentation: Middle Occiput Anterior).  Easy delivery of the head, easy delivery of the shoulders, bulb suction mouth and nares, baby placed on pt abdomen cord doubly clamped and cut by support person. APGAR: 8, 9; weight .   Placenta status: Intact, Spontaneous.  Cord: 3 vessels   Anesthesia: Epidural  Episiotomy: None Lacerations: 2nd degree Suture Repair: 3.0 and 4.0 monocryl for good hemostasis Est. Blood Loss (mL): 150  Mom to postpartum.  Baby to rooming in States can take percocet and motrin with no side effects.Marland Kitchen  Sekou Zuckerman 10/28/2011, 12:33 AM

## 2011-02-12 ENCOUNTER — Ambulatory Visit (INDEPENDENT_AMBULATORY_CARE_PROVIDER_SITE_OTHER): Payer: Medicaid Other | Admitting: Family Medicine

## 2011-02-12 ENCOUNTER — Encounter: Payer: Self-pay | Admitting: Family Medicine

## 2011-02-12 VITALS — BP 111/73 | HR 84 | Temp 97.7°F | Ht 65.0 in | Wt 141.0 lb

## 2011-02-12 DIAGNOSIS — B373 Candidiasis of vulva and vagina: Secondary | ICD-10-CM

## 2011-02-12 DIAGNOSIS — Z3200 Encounter for pregnancy test, result unknown: Secondary | ICD-10-CM

## 2011-02-12 DIAGNOSIS — Z23 Encounter for immunization: Secondary | ICD-10-CM

## 2011-02-12 DIAGNOSIS — B3731 Acute candidiasis of vulva and vagina: Secondary | ICD-10-CM

## 2011-02-12 DIAGNOSIS — N920 Excessive and frequent menstruation with regular cycle: Secondary | ICD-10-CM

## 2011-02-12 MED ORDER — FLUCONAZOLE 150 MG PO TABS: 150.0000 mg | ORAL_TABLET | Freq: Once | ORAL | Status: AC

## 2011-02-12 NOTE — Patient Instructions (Signed)
It was nice to meet you. I sent Diflucan to your pharmacy. Please schedule an annual complete physical with Dr. Tye Savoy at your earliest convenience. Good luck!

## 2011-02-12 NOTE — Assessment & Plan Note (Signed)
Symptoms resolving after Monistat.  Will give Diflucan PRN if symptoms worsen.

## 2011-02-12 NOTE — Progress Notes (Signed)
  Subjective:    Patient ID: [name], female    DOB: Oct 12, 1980, 31 y.o.   MRN: [id number]  HPI  Patient presents to clinic for pregnancy test.  She has been trying to get pregnant for 2-3 months.  LMP 01/20/2011.  Patient has not missed a period yet but she states that her periods have been irregular.  Currently takes a Women's vitamin.  Additionally, she took Monistat for a presumed yeast infection a few days ago.  Symptoms of itchiness, vaginal rash, and discharge have improved after taking Monistat.  Patient does not wish to be tested for yeast today; she is more concerned about her pregnancy test.  Review of Systems Denies any fever, chills, night sweats, abdominal pain.  Endorses fatigue and nausea, but no emesis.    Objective:   Physical Exam  Constitutional: No distress.  Cardiovascular: Normal rate, regular rhythm and normal heart sounds.   No murmur heard. Pulmonary/Chest: Effort normal and breath sounds normal.  Abdominal: Soft. Bowel sounds are normal. She exhibits no distension. There is no tenderness.  Neurological: She is alert.  Skin: Skin is warm. No rash noted.          Assessment & Plan:

## 2011-03-16 ENCOUNTER — Ambulatory Visit: Payer: Medicaid Other | Admitting: Family Medicine

## 2011-04-02 ENCOUNTER — Other Ambulatory Visit: Payer: Medicaid Other

## 2011-04-02 ENCOUNTER — Encounter (INDEPENDENT_AMBULATORY_CARE_PROVIDER_SITE_OTHER): Payer: Medicaid Other | Admitting: Registered Nurse

## 2011-04-02 DIAGNOSIS — O26849 Uterine size-date discrepancy, unspecified trimester: Secondary | ICD-10-CM

## 2011-04-06 ENCOUNTER — Encounter: Payer: Medicaid Other | Admitting: Obstetrics and Gynecology

## 2011-04-13 ENCOUNTER — Encounter: Payer: Medicaid Other | Admitting: Obstetrics and Gynecology

## 2011-04-19 ENCOUNTER — Encounter (INDEPENDENT_AMBULATORY_CARE_PROVIDER_SITE_OTHER): Payer: Medicaid Other | Admitting: Obstetrics and Gynecology

## 2011-04-19 DIAGNOSIS — Z3200 Encounter for pregnancy test, result unknown: Secondary | ICD-10-CM

## 2011-04-25 LAB — OB RESULTS CONSOLE HEPATITIS B SURFACE ANTIGEN: Hepatitis B Surface Ag: NEGATIVE

## 2011-04-25 LAB — OB RESULTS CONSOLE HIV ANTIBODY (ROUTINE TESTING): HIV: NONREACTIVE

## 2011-04-25 LAB — OB RESULTS CONSOLE HGB/HCT, BLOOD: HCT: 42 %

## 2011-04-25 LAB — OB RESULTS CONSOLE RPR: RPR: NONREACTIVE

## 2011-05-08 ENCOUNTER — Ambulatory Visit (INDEPENDENT_AMBULATORY_CARE_PROVIDER_SITE_OTHER): Payer: Medicaid Other | Admitting: Obstetrics and Gynecology

## 2011-05-08 ENCOUNTER — Encounter: Payer: Self-pay | Admitting: Obstetrics and Gynecology

## 2011-05-08 VITALS — BP 102/52 | Wt 157.0 lb

## 2011-05-08 DIAGNOSIS — J069 Acute upper respiratory infection, unspecified: Secondary | ICD-10-CM | POA: Insufficient documentation

## 2011-05-08 DIAGNOSIS — Z331 Pregnant state, incidental: Secondary | ICD-10-CM

## 2011-05-08 DIAGNOSIS — Z124 Encounter for screening for malignant neoplasm of cervix: Secondary | ICD-10-CM

## 2011-05-08 MED ORDER — ONDANSETRON 8 MG PO TBDP: 8.0000 mg | ORAL_TABLET | Freq: Three times a day (TID) | ORAL | Status: AC | PRN

## 2011-05-08 MED ORDER — ERYTHROMYCIN BASE 500 MG PO TABS: 250.0000 mg | ORAL_TABLET | Freq: Four times a day (QID) | ORAL | Status: AC

## 2011-05-08 NOTE — Progress Notes (Signed)
Trace of protein Pt had pressure No vaginal discharge BMI: 26.1 Chem 10: SG- 1.025 Ph- 5 Protein- Trace

## 2011-05-08 NOTE — Progress Notes (Signed)
  Subjective:    [name] is being seen today for her first obstetrical visit.  This  a planned pregnancy. She is at 14 4/[redacted] weeks gestation. Her obstetrical history is significant for hx PIH, pp hemorrhage. Relationship with FOB: spouse, living together. Patient does intend to breast feed. Pregnancy history fully reviewed.   Review of Systems:   Review of Systems  Constitutional: Negative.   HENT: Negative.        HEENT WNL with except crusty nasal drainage and nasal turbinates reddened edematous  Eyes: Negative.   Respiratory: Cough: with stuffy nose, yellow drainage, cough,   Cardiovascular: Negative.   Gastrointestinal: Negative.   Genitourinary: Negative.   Musculoskeletal: Negative.   Neurological: Negative.   Hematological: Negative.   Psychiatric/Behavioral: Negative.     Objective:     BP 102/52  Wt 157 lb (71.215 kg)  LMP 01/16/2011 Physical Exam  Constitutional: She is oriented to person, place, and time. She appears well-nourished.  HENT:  Head: Normocephalic.  Neck: Neck supple. No thyromegaly present.  Respiratory: Effort normal and breath sounds normal.  Genitourinary: Vagina normal and uterus normal.  Neurological: She is alert and oriented to person, place, and time. She has normal reflexes.  Skin: Skin is warm and dry.  Psychiatric: She has a normal mood and affect.    Maternal Exam:  Abdomen: Patient reports no abdominal tenderness. Introitus: Normal vulva. Normal vagina.  Ferning test: not done.  Nitrazine test: not done. Amniotic fluid character: not assessed.  Pelvis: adequate for delivery.   Cervix: Cervix evaluated by sterile speculum exam and digital exam.        Assessment:  14 4/7 week IUP  Pregnancy: G4P1 Patient Active Problem List  Diagnoses  . ANXIETY  . DEPRESSION  . ADHD  . HYPOTENSION-ORTHOSTATIC  . BREAST MASS, RIGHT  . MENORRHAGIA  . FREQUENCY, URINARY  . CONSTIPATION  . UNSPEC SPONTANEOUS AB WITHOUT MENTION  COMP  . DIARRHEA  . ABDOMINAL PAIN, LOWER  . Asthma  . Insomnia  . Amenorrhea  . Encounter for pregnancy test  . Vaginal yeast infection  . Upper respiratory infection       Plan:     Initial labs drawn. Prenatal vitamins. Problem list reviewed and updated. AFP3 discussed: requested. Role of ultrasound in pregnancy discussed; fetal survey: requested. Amniocentesis discussed: not indicated2. Follow up in 2 weeks lab,. plan US anatomy 4 weeks, RX zpack, zofran discussed Tierrah Anastos, [facility] 05/08/2011

## 2011-05-08 NOTE — Patient Instructions (Addendum)

## 2011-05-09 LAB — OB RESULTS CONSOLE GC/CHLAMYDIA: Gonorrhea: NEGATIVE

## 2011-05-10 LAB — CULTURE, OB URINE: Colony Count: >=100000

## 2011-05-10 LAB — PAP IG, CT-NG, RFX HPV ASCU: GC Probe Amp: NEGATIVE

## 2011-05-29 ENCOUNTER — Encounter: Payer: Self-pay | Admitting: Obstetrics and Gynecology

## 2011-05-29 ENCOUNTER — Ambulatory Visit (INDEPENDENT_AMBULATORY_CARE_PROVIDER_SITE_OTHER): Payer: Medicaid Other | Admitting: Obstetrics and Gynecology

## 2011-05-29 ENCOUNTER — Ambulatory Visit (INDEPENDENT_AMBULATORY_CARE_PROVIDER_SITE_OTHER): Payer: Medicaid Other

## 2011-05-29 VITALS — BP 104/64 | Wt 160.0 lb

## 2011-05-29 DIAGNOSIS — Z331 Pregnant state, incidental: Secondary | ICD-10-CM

## 2011-05-29 DIAGNOSIS — Z3689 Encounter for other specified antenatal screening: Secondary | ICD-10-CM

## 2011-05-29 LAB — US OB COMP + 14 WK

## 2011-05-29 NOTE — Progress Notes (Signed)
Ultrasound shows:  SIUP  S=D     [place] EDD: c/w dates            AFI: normal           Cervical length: 3.5 cm           Placenta localization: posterior           Fetal presentation: breech                    Anatomy survey is normal           Gender : female Pt left before being seen

## 2011-06-13 ENCOUNTER — Encounter: Payer: Self-pay | Admitting: Obstetrics and Gynecology

## 2011-06-13 DIAGNOSIS — O09299 Supervision of pregnancy with other poor reproductive or obstetric history, unspecified trimester: Secondary | ICD-10-CM

## 2011-06-13 DIAGNOSIS — Z9289 Personal history of other medical treatment: Secondary | ICD-10-CM

## 2011-06-14 ENCOUNTER — Ambulatory Visit (INDEPENDENT_AMBULATORY_CARE_PROVIDER_SITE_OTHER): Payer: Medicaid Other | Admitting: Obstetrics and Gynecology

## 2011-06-14 VITALS — BP 100/60 | Temp 98.1°F | Wt 167.0 lb

## 2011-06-14 DIAGNOSIS — Z888 Allergy status to other drugs, medicaments and biological substances status: Secondary | ICD-10-CM

## 2011-06-14 DIAGNOSIS — Z331 Pregnant state, incidental: Secondary | ICD-10-CM

## 2011-06-14 DIAGNOSIS — Z880 Allergy status to penicillin: Secondary | ICD-10-CM

## 2011-06-14 DIAGNOSIS — T360X5A Adverse effect of penicillins, initial encounter: Secondary | ICD-10-CM

## 2011-06-14 DIAGNOSIS — J019 Acute sinusitis, unspecified: Secondary | ICD-10-CM

## 2011-06-14 MED ORDER — AZITHROMYCIN 250 MG PO TABS: ORAL_TABLET | ORAL | Status: AC

## 2011-06-14 NOTE — Progress Notes (Signed)
Well.Sinus infection treated with erythromycin. Still C/O green drainage with pressure. Will start Zyrtec, Mucinex and Z-Pack Quad screen declined

## 2011-06-14 NOTE — Progress Notes (Signed)
Pt has no complaints except fels sick and rundown from sinus infection/ JM

## 2011-06-26 ENCOUNTER — Telehealth: Payer: Self-pay | Admitting: Obstetrics and Gynecology

## 2011-06-26 NOTE — Telephone Encounter (Signed)
TC from pt.   States is having difficulty sleeping x 1wk.  Sleeping only 2-3 hrs/night.  Has tried Tylenol Pm and Unisom with no improvement.   Took Ambien during last pregnancy.  Stats is so tired feels as though she is having slight hallucinations.   Has Hx bi-polar and generalized anxiety.  Denies homcidal or suicidal thoughts.   Per [facility] scheduled for eval 06/27/11 with DR VPH.

## 2011-06-26 NOTE — Telephone Encounter (Signed)
Triage/epic 

## 2011-06-26 NOTE — Telephone Encounter (Signed)
TC to pt. LM to return call regarding message. 

## 2011-06-27 ENCOUNTER — Encounter: Payer: Medicaid Other | Admitting: Obstetrics and Gynecology

## 2011-06-28 ENCOUNTER — Encounter (HOSPITAL_COMMUNITY): Payer: Self-pay | Admitting: Cardiology

## 2011-06-28 ENCOUNTER — Emergency Department (HOSPITAL_COMMUNITY)
Admission: EM | Admit: 2011-06-28 | Discharge: 2011-06-28 | Disposition: A | Discharge status: Home / Self Care | Payer: Medicaid Other | Attending: Emergency Medicine | Admitting: Emergency Medicine

## 2011-06-28 DIAGNOSIS — J45909 Unspecified asthma, uncomplicated: Secondary | ICD-10-CM | POA: Insufficient documentation

## 2011-06-28 DIAGNOSIS — Z880 Allergy status to penicillin: Secondary | ICD-10-CM

## 2011-06-28 DIAGNOSIS — O9989 Other specified diseases and conditions complicating pregnancy, childbirth and the puerperium: Secondary | ICD-10-CM | POA: Insufficient documentation

## 2011-06-28 DIAGNOSIS — R109 Unspecified abdominal pain: Secondary | ICD-10-CM | POA: Insufficient documentation

## 2011-06-28 DIAGNOSIS — K5900 Constipation, unspecified: Secondary | ICD-10-CM | POA: Insufficient documentation

## 2011-06-28 DIAGNOSIS — M545 Low back pain, unspecified: Secondary | ICD-10-CM | POA: Insufficient documentation

## 2011-06-28 DIAGNOSIS — T360X5A Adverse effect of penicillins, initial encounter: Secondary | ICD-10-CM | POA: Insufficient documentation

## 2011-06-28 DIAGNOSIS — Z888 Allergy status to other drugs, medicaments and biological substances status: Secondary | ICD-10-CM | POA: Insufficient documentation

## 2011-06-28 DIAGNOSIS — R112 Nausea with vomiting, unspecified: Secondary | ICD-10-CM | POA: Insufficient documentation

## 2011-06-28 LAB — URINALYSIS, ROUTINE W REFLEX MICROSCOPIC
Bilirubin Urine: NEGATIVE
Hgb urine dipstick: NEGATIVE
Ketones, ur: 15 mg/dL — AB
Specific Gravity, Urine: 1.013 (ref 1.005–1.030)
Urobilinogen, UA: 0.2 mg/dL (ref 0.0–1.0)

## 2011-06-28 LAB — BASIC METABOLIC PANEL
CO2: 18 mEq/L — ABNORMAL LOW (ref 19–32)
Chloride: 102 mEq/L (ref 96–112)
Creatinine, Ser: 0.41 mg/dL — ABNORMAL LOW (ref 0.50–1.10)
Potassium: 3.8 mEq/L (ref 3.5–5.1)

## 2011-06-28 LAB — CBC
HCT: 36.5 % (ref 36.0–46.0)
Hemoglobin: 12.6 g/dL (ref 12.0–15.0)
RDW: 13.4 % (ref 11.5–15.5)
WBC: 9.5 10*3/uL (ref 4.0–10.5)

## 2011-06-28 LAB — DIFFERENTIAL
Basophils Absolute: 0 10*3/uL (ref 0.0–0.1)
Lymphocytes Relative: 16 % (ref 12–46)
Monocytes Absolute: 0.6 10*3/uL (ref 0.1–1.0)
Neutro Abs: 7.3 10*3/uL (ref 1.7–7.7)
Neutrophils Relative %: 77 % (ref 43–77)

## 2011-06-28 LAB — URINE MICROSCOPIC-ADD ON

## 2011-06-28 LAB — OCCULT BLOOD, POC DEVICE: Fecal Occult Bld: NEGATIVE

## 2011-06-28 MED ORDER — FENTANYL CITRATE 0.05 MG/ML IJ SOLN
50.0000 ug | Freq: Once | INTRAMUSCULAR | Status: AC
  Administered 2011-06-28: 50 ug via INTRAVENOUS
  Filled 2011-06-28: qty 2

## 2011-06-28 MED ORDER — ONDANSETRON HCL 4 MG/2ML IJ SOLN
4.0000 mg | Freq: Once | INTRAMUSCULAR | Status: AC
  Administered 2011-06-28: 4 mg via INTRAVENOUS
  Filled 2011-06-28: qty 2

## 2011-06-28 MED ORDER — SODIUM CHLORIDE 0.9 % IV BOLUS (SEPSIS)
1000.0000 mL | Freq: Once | INTRAVENOUS | Status: AC
  Administered 2011-06-28: 1000 mL via INTRAVENOUS

## 2011-06-28 MED ORDER — FENTANYL CITRATE 0.05 MG/ML IJ SOLN
50.0000 ug | Freq: Once | INTRAMUSCULAR | Status: AC
  Administered 2011-06-28: 50 ug via INTRAVENOUS

## 2011-06-28 MED ORDER — MAGNESIUM HYDROXIDE 400 MG/5ML PO SUSP
15.0000 mL | Freq: Every day | ORAL | Status: DC
  Administered 2011-06-28: 15 mL via ORAL
  Filled 2011-06-28: qty 30

## 2011-06-28 NOTE — ED Notes (Signed)
PA at bedside.

## 2011-06-28 NOTE — ED Provider Notes (Signed)
Assumed care in CDU.  Pregnant pt presents with abd pain suggestive of constipation.  Unrelieved with soap sud enema. LBM 3 days ago.  Unable to manually disimpacted by Dr. Golda Acre.  Normal fetal activity, doubt fetal complications.  COntinues to endorse pain. Pain is constant, unrelieved with current treatment.  On exam, heart RRR, no M/R/G, lung CTAB, Abd gravid with mild tenderness to suprapubic region, Back markedly tender toward sacral region with no overlying skin changes.  Pt appears tearful.  WIll continue with pain management and will give Milk of Magnesia.    2:35 PM Pt able to produce a large bowel movement and feels better.  Her pain improves.  UA negative for UTI.  Pt request to be discharge.  She is stable at this time. Care instruction given.    Fayrene Helper, PA-C 06/28/11 1441

## 2011-06-28 NOTE — ED Notes (Signed)
Pt reports that she is 6 months pregnant and unable to pass stool. Reports that she has been unable to have a bowel movement since 3 days ago. Pt is nauseated and having dry heaves.

## 2011-06-28 NOTE — ED Notes (Signed)
[facility] nurse called. Equipment placed in room.

## 2011-06-28 NOTE — ED Provider Notes (Addendum)
History     CSN: [id number]  Arrival date & time 06/28/11  4098   First MD Initiated Contact with Patient 06/28/11 [phone]      Chief Complaint  Patient presents with  . Abdominal Pain    (Consider location/radiation/quality/duration/timing/severity/associated sxs/prior treatment) HPI Comments: Patient presents with 3 days of gradually worsening constipation.  This morning the patient has had associated nausea and vomiting.  Patient is 6 months pregnant and has been receiving prenatal care without complication.  She notes that she did try Senokot last night and fleets enemas yesterday without relief.  She has significant low back and abdominal cramping.  She describes the pains as "labor pains".  She denies any vaginal bleeding or discharge.  Patient has felt the baby move in the last 24 hours.  Patient presents now because the pain is significantly increased and feels like a time 10 years ago when she had impaction.  Patient is a 31 y.o. female presenting with abdominal pain. The history is provided by the patient. No language interpreter was used.  Abdominal Pain The primary symptoms of the illness include abdominal pain, nausea and vomiting. The primary symptoms of the illness do not include fever, fatigue, shortness of breath, diarrhea, hematemesis, hematochezia, dysuria, vaginal discharge or vaginal bleeding. The current episode started 2 days ago. The onset of the illness was gradual. The problem has been gradually worsening.  The patient states that she believes she is currently pregnant. The patient has had a change in bowel habit. Additional symptoms associated with the illness include constipation and back pain. Symptoms associated with the illness do not include chills, anorexia, diaphoresis, heartburn, urgency, hematuria or frequency.    Past Medical History  Diagnosis Date  . Abnormal Pap smear   . Headache   . Anemia   . Anxiety   . PPH (postpartum hemorrhage)   . Asthma   .  Blood transfusion     Pt delivery in 2009  . Complication of anesthesia   . Spinal headache     History reviewed. No pertinent past surgical history.  Family History  Problem Relation Age of Onset  . Thrombophlebitis Mother   . Hypertension Father   . Heart disease Maternal Grandmother   . Heart disease Paternal Grandfather   . Hypertension Paternal Grandfather     History  Substance Use Topics  . Smoking status: Former Games developer  . Smokeless tobacco: Not on file  . Alcohol Use: Not on file    OB History    Grav Para Term Preterm Abortions TAB SAB Ect Mult Living   3 1 1  1   1  1       Review of Systems  Constitutional: Negative.  Negative for fever, chills, diaphoresis and fatigue.  HENT: Negative.   Eyes: Negative.  Negative for discharge and redness.  Respiratory: Negative.  Negative for cough and shortness of breath.   Cardiovascular: Negative.  Negative for chest pain.  Gastrointestinal: Positive for nausea, vomiting, abdominal pain and constipation. Negative for heartburn, diarrhea, hematochezia, anorexia and hematemesis.  Genitourinary: Negative.  Negative for dysuria, urgency, frequency, hematuria, vaginal bleeding and vaginal discharge.  Musculoskeletal: Positive for back pain.  Skin: Negative.  Negative for color change and rash.  Neurological: Negative.  Negative for syncope and headaches.  Hematological: Negative.  Negative for adenopathy.  Psychiatric/Behavioral: Negative.  Negative for confusion.  All other systems reviewed and are negative.    Allergies  Codeine; Amoxicillin; Keflex; and Penicillins  Home Medications  Current Outpatient Rx  Name Route Sig Dispense Refill  . ALBUTEROL SULFATE HFA 108 (90 BASE) MCG/ACT IN AERS Inhalation Inhale 1 puff into the lungs every 4 (four) hours as needed. For wheezing 1 Inhaler 3  . LAMOTRIGINE 200 MG PO TABS Oral Take 200 mg by mouth daily.    Marland Kitchen PRENATAL MULTIVITAMIN CH Oral Take 1 tablet by mouth daily.      . QUETIAPINE FUMARATE 25 MG PO TABS Oral Take 25 mg by mouth daily as needed. insomnia      BP 162/75  Pulse 108  Temp(Src) 97.9 F (36.6 C) (Oral)  Resp 20  SpO2 96%  LMP 01/16/2011  Physical Exam  Nursing note and vitals reviewed. Constitutional: She is oriented to person, place, and time. She appears well-developed and well-nourished.  Non-toxic appearance. She does not have a sickly appearance.  HENT:  Head: Normocephalic and atraumatic.  Eyes: Conjunctivae, EOM and lids are normal. Pupils are equal, round, and reactive to light. No scleral icterus.  Neck: Trachea normal and normal range of motion. Neck supple.  Cardiovascular: Regular rhythm, S1 normal, S2 normal and normal heart sounds.  Tachycardia present.   Pulmonary/Chest: Effort normal and breath sounds normal. No respiratory distress. She has no wheezes. She has no rales. She exhibits no tenderness.  Abdominal: Soft. Normal appearance. There is no tenderness. There is no rebound, no guarding and no CVA tenderness.       Gravid abdomen without rebound or guarding  Musculoskeletal: Normal range of motion. She exhibits no edema.  Neurological: She is alert and oriented to person, place, and time. She has normal strength.  Skin: Skin is warm, dry and intact. No rash noted.  Psychiatric: She has a normal mood and affect. Her behavior is normal. Judgment and thought content normal.    ED Course  Procedures (including critical care time)  Results for orders placed during the hospital encounter of 06/28/11  CBC      Component Value Range   WBC 9.5  4.0 - 10.5 (K/uL)   RBC 3.96  3.87 - 5.11 (MIL/uL)   Hemoglobin 12.6  12.0 - 15.0 (g/dL)   HCT 16.1  09.6 - 04.5 (%)   MCV 92.2  78.0 - 100.0 (fL)   MCH 31.8  26.0 - 34.0 (pg)   MCHC 34.5  30.0 - 36.0 (g/dL)   RDW 40.9  81.1 - 91.4 (%)   Platelets 168  150 - 400 (K/uL)  DIFFERENTIAL      Component Value Range   Neutrophils Relative 77  43 - 77 (%)   Neutro Abs 7.3  1.7 -  7.7 (K/uL)   Lymphocytes Relative 16  12 - 46 (%)   Lymphs Abs 1.5  0.7 - 4.0 (K/uL)   Monocytes Relative 6  3 - 12 (%)   Monocytes Absolute 0.6  0.1 - 1.0 (K/uL)   Eosinophils Relative 1  0 - 5 (%)   Eosinophils Absolute 0.1  0.0 - 0.7 (K/uL)   Basophils Relative 0  0 - 1 (%)   Basophils Absolute 0.0  0.0 - 0.1 (K/uL)  BASIC METABOLIC PANEL      Component Value Range   Sodium 136  135 - 145 (mEq/L)   Potassium 3.8  3.5 - 5.1 (mEq/L)   Chloride 102  96 - 112 (mEq/L)   CO2 18 (*) 19 - 32 (mEq/L)   Glucose, Bld 95  70 - 99 (mg/dL)   BUN 10  6 - 23 (mg/dL)  Creatinine, Ser 0.41 (*) 0.50 - 1.10 (mg/dL)   Calcium 8.7  8.4 - 45.4 (mg/dL)   GFR calc non Af Amer >90  >90 (mL/min)   GFR calc Af Amer >90  >90 (mL/min)  OCCULT BLOOD, POC DEVICE      Component Value Range   Fecal Occult Bld NEGATIVE         MDM  Patient with abdominal cramping and constipation for the last few days.  There's concern that she may have a fecal impaction which we will check with rectal exam.  There is also concern for possible pregnancy complications given the intense abdominal cramping.   The OB nurse from [facility] has arrived and has been able to obtain fetal heart tones.  She will also check the cervix for any dilation or effacement.  Patient has no specific symptoms for UTI at this time.  Patient's initial blood pressure was hypertensive but patient is also significantly uncomfortable and this elevated measurement may also be due to pain and will need to be rechecked when the patient is more comfortable and calm.        Nat Christen, MD 06/28/11 [phone]  Patient without fecal impaction that we are able to manually disimpact today.  There is still significant concern for constipation so the patient has been given an enema here.  I do also want to evaluate the patient for UTI and pending a urinalysis.  Patient is going to be moved to the CDU  For further management while we are awaiting  constipation relief and urinalysis results.  Patient has been found to not be in labor and baby has appropriate he'll cardiac activity and does not need further OB monitoring at this time.    Nat Christen, MD 06/28/11 1130

## 2011-06-28 NOTE — ED Notes (Signed)
Pt cleared by [facility] nurse. Removed from monitor. Strip placed in cart.

## 2011-06-28 NOTE — Discharge Instructions (Signed)

## 2011-06-28 NOTE — ED Notes (Signed)
Pt did not need to have in and out cath. It was able to void on her own.

## 2011-06-28 NOTE — ED Provider Notes (Signed)
Medical screening examination/treatment/procedure(s) were conducted as a shared visit with non-physician practitioner(s) and myself.  I personally evaluated the patient during the encounter   [place] A Domonique Cothran, MD 06/28/11 1606 

## 2011-06-28 NOTE — ED Notes (Signed)
Set up soap suds enema at the bedside. Pt given about of fluid. Pt waiting to attempt to use the bathroom.

## 2011-06-28 NOTE — ED Notes (Signed)
Pt ambulated to restroom. Pt still unable to give urine sample. Pt did report having a large bowel movement and bright red blood in the toilet as well. Pt states that she does feel some relief after the bowel movement. Marland Kitchen

## 2011-06-28 NOTE — ED Notes (Addendum)
OB nurse at the bedside with pt. Placed in fetal monitor, heart tones noted at 141. Family remains at the bedside.

## 2011-06-28 NOTE — ED Notes (Signed)
Patient states that she is 6 months pregnant and impacted, patient is crying at triage

## 2011-06-28 NOTE — ED Notes (Signed)
Pt ambulated to restroom. To came out of restroom and was tearful. Pt reports that she was not able to get any more stool or urine. Pt states that she "felt back there and there was a big mass". States "I don't know how it is gong to come out".

## 2011-07-03 ENCOUNTER — Telehealth: Payer: Self-pay | Admitting: Obstetrics and Gynecology

## 2011-07-03 NOTE — Telephone Encounter (Signed)
TC to pt regarding missed appt.  Is scheduled 07/11/11.  Offered sooner appt.   Pt states is doing OK and is fine waiting until appt as scheduled.

## 2011-07-11 ENCOUNTER — Encounter: Payer: Medicaid Other | Admitting: Obstetrics and Gynecology

## 2011-07-17 ENCOUNTER — Encounter: Payer: Medicaid Other | Admitting: Obstetrics and Gynecology

## 2011-07-30 ENCOUNTER — Ambulatory Visit (INDEPENDENT_AMBULATORY_CARE_PROVIDER_SITE_OTHER): Payer: Medicaid Other | Admitting: Obstetrics and Gynecology

## 2011-07-30 ENCOUNTER — Encounter: Payer: Self-pay | Admitting: Obstetrics and Gynecology

## 2011-07-30 VITALS — BP 114/60 | Wt 181.0 lb

## 2011-07-30 DIAGNOSIS — Z331 Pregnant state, incidental: Secondary | ICD-10-CM

## 2011-07-30 LAB — GLUCOSE, POCT (MANUAL RESULT ENTRY): POC Glucose: 142 mg/dl — AB (ref 70–99)

## 2011-07-30 NOTE — Progress Notes (Signed)
Good FA Very sensitive to odors but not vomiting Pressure with movement No LOF  No Bleeding

## 2011-07-30 NOTE — Progress Notes (Signed)
C/O pain in both wrist and ankles Blood glucose- 142 Pt had chicken casserole & ice cream @ 1 pm for lunch

## 2011-08-06 ENCOUNTER — Telehealth: Payer: Self-pay | Admitting: Obstetrics and Gynecology

## 2011-08-06 NOTE — Telephone Encounter (Signed)
Suggest UNISOM tablet at bedtime (Doxylamine)

## 2011-08-06 NOTE — Telephone Encounter (Signed)
Pt is [redacted]w[redacted]d and would like something for sleep.  Stated you discussed at last ov.  To SR for answer.  ld

## 2011-08-06 NOTE — Telephone Encounter (Signed)
Laura/sr pt °

## 2011-08-07 ENCOUNTER — Telehealth: Payer: Self-pay | Admitting: Obstetrics and Gynecology

## 2011-08-07 NOTE — Telephone Encounter (Signed)
Pt states she has tried Unisom, Benadryl, and Tylenol PM with no change.  States she has only been getting 2 hrs of sleep a night.  ld

## 2011-08-07 NOTE — Telephone Encounter (Signed)
OK to call in Ambien 5 mg po HS PRN  #[id number]  No refill

## 2011-08-08 MED ORDER — ZOLPIDEM TARTRATE 10 MG PO TABS: 5.0000 mg | ORAL_TABLET | Freq: Every evening | ORAL | Status: DC | PRN

## 2011-08-08 NOTE — Telephone Encounter (Signed)
Pt notified of rx being called in to [facility] per SR.  Ambien 5mg  po HS PRN #[id number] no refills.  ld

## 2011-08-13 ENCOUNTER — Telehealth: Payer: Self-pay | Admitting: Obstetrics and Gynecology

## 2011-08-13 NOTE — Telephone Encounter (Incomplete)
Tc to pt regarding msg below, lm on vm to call back 

## 2011-08-14 ENCOUNTER — Encounter: Payer: Medicaid Other | Admitting: Obstetrics and Gynecology

## 2011-08-14 ENCOUNTER — Other Ambulatory Visit: Payer: Medicaid Other

## 2011-08-22 ENCOUNTER — Other Ambulatory Visit: Payer: Medicaid Other

## 2011-08-22 ENCOUNTER — Encounter: Payer: Self-pay | Admitting: Obstetrics and Gynecology

## 2011-08-22 ENCOUNTER — Ambulatory Visit (INDEPENDENT_AMBULATORY_CARE_PROVIDER_SITE_OTHER): Payer: Medicaid Other | Admitting: Obstetrics and Gynecology

## 2011-08-22 VITALS — BP 102/58 | Wt 194.0 lb

## 2011-08-22 DIAGNOSIS — Z331 Pregnant state, incidental: Secondary | ICD-10-CM

## 2011-08-22 LAB — HEMOGLOBIN: Hemoglobin: 10.5 g/dL — ABNORMAL LOW (ref 12.0–15.0)

## 2011-08-22 NOTE — Progress Notes (Signed)
Glucola, RPR, hemoglobin today. Return to office in 2 weeks. Weight gain discussed.  I recommended nutrition consult.  Patient will consider. Circumcision, breast-feeding, pregnancy care, car seats discussed. Dr. Stefano Gaul

## 2011-08-22 NOTE — Progress Notes (Signed)
Pt c/o nausea. Glucola done today. Pt concerned about weight gain.

## 2011-09-04 ENCOUNTER — Other Ambulatory Visit: Payer: Self-pay | Admitting: Obstetrics and Gynecology

## 2011-09-04 ENCOUNTER — Ambulatory Visit (INDEPENDENT_AMBULATORY_CARE_PROVIDER_SITE_OTHER): Payer: Medicaid Other | Admitting: Obstetrics and Gynecology

## 2011-09-04 VITALS — BP 100/60 | Wt 194.0 lb

## 2011-09-04 DIAGNOSIS — G4700 Insomnia, unspecified: Secondary | ICD-10-CM

## 2011-09-04 DIAGNOSIS — Z331 Pregnant state, incidental: Secondary | ICD-10-CM

## 2011-09-04 DIAGNOSIS — J069 Acute upper respiratory infection, unspecified: Secondary | ICD-10-CM

## 2011-09-04 MED ORDER — ZOLPIDEM TARTRATE 10 MG PO TABS: 5.0000 mg | ORAL_TABLET | Freq: Every evening | ORAL | Status: AC | PRN

## 2011-09-04 MED ORDER — AZITHROMYCIN 250 MG PO TABS: ORAL_TABLET | ORAL | Status: AC

## 2011-09-04 NOTE — Progress Notes (Signed)
Pt is still having a hard time sleeping. Pt states she needs a RF on Ambien. Pt want to go over 1GTT results.

## 2011-09-04 NOTE — Telephone Encounter (Signed)
TRIAGE/RX °

## 2011-09-04 NOTE — Progress Notes (Signed)
[redacted]w[redacted]d Glucola 131.  Hemoglobin 10.5.  RPR nonreactive. Iron therapy discussed. Patient complains of green sputum production.  Heart regular rate and rhythm.  Chest: Wheezes and rhonchi noted.  Z-Pak ordered. Insomnia and anxiety discussed.  I suggested that the patient may have trouble sleeping because of uncontrolled anxiety.  She agrees that this may be a possibility.  She will discuss therapy of her anxiety with her therapist.  Ambien 10 mg tablets one half every 2-3 nights as needed. Return to office in 2 weeks. Dr. Stefano Gaul

## 2011-09-05 ENCOUNTER — Other Ambulatory Visit: Payer: Self-pay

## 2011-09-05 NOTE — Telephone Encounter (Signed)
Lm on vm rx sent to pharm 

## 2011-09-13 ENCOUNTER — Ambulatory Visit (INDEPENDENT_AMBULATORY_CARE_PROVIDER_SITE_OTHER): Payer: Medicaid Other | Admitting: Family Medicine

## 2011-09-13 ENCOUNTER — Encounter: Payer: Self-pay | Admitting: Family Medicine

## 2011-09-13 VITALS — BP 115/70 | HR 89 | Temp 97.8°F | Wt 194.3 lb

## 2011-09-13 DIAGNOSIS — J029 Acute pharyngitis, unspecified: Secondary | ICD-10-CM

## 2011-09-13 DIAGNOSIS — J019 Acute sinusitis, unspecified: Secondary | ICD-10-CM | POA: Insufficient documentation

## 2011-09-13 MED ORDER — SALINE SPRAY 0.65 % NA SOLN: 1.0000 | NASAL | Status: DC | PRN

## 2011-09-13 MED ORDER — AZITHROMYCIN 500 MG PO TABS: 500.0000 mg | ORAL_TABLET | Freq: Every day | ORAL | Status: AC

## 2011-09-13 NOTE — Assessment & Plan Note (Signed)
Rapid strep negative.  Will give Azithromycin x 5 days and nasal spray. Red flags reviewed per AVS.  +

## 2011-09-13 NOTE — Patient Instructions (Addendum)
Please go to your pharmacy and pick up nasal saline spray for congestion. Please take antibiotic as directed. Follow up with your OB-GYN physician. If your symptoms worsen or you develop persistent high fevers, vomiting, decreased appetite - please return to clinic.   Sinusitis Sinuses are air pockets within the bones of your face. The growth of bacteria within a sinus leads to infection. The infection prevents the sinuses from draining. This infection is called sinusitis. SYMPTOMS  There will be different areas of pain depending on which sinuses have become infected.  The maxillary sinuses often produce pain beneath the eyes.   Frontal sinusitis may cause pain in the middle of the forehead and above the eyes.  Other problems (symptoms) include:  Toothaches.   Colored, pus-like (purulent) drainage from the nose.   Swelling, warmth, and tenderness over the sinus areas may be signs of infection.  TREATMENT  Sinusitis is most often determined by an exam.X-rays may be taken. If x-rays have been taken, make sure you obtain your results or find out how you are to obtain them. Your caregiver may give you medications (antibiotics). These are medications that will help kill the bacteria causing the infection. You may also be given a medication (decongestant) that helps to reduce sinus swelling.  HOME CARE INSTRUCTIONS   Only take over-the-counter or prescription medicines for pain, discomfort, or fever as directed by your caregiver.   Drink extra fluids. Fluids help thin the mucus so your sinuses can drain more easily.   Applying either moist heat or ice packs to the sinus areas may help relieve discomfort.   Use saline nasal sprays to help moisten your sinuses. The sprays can be found at your local drugstore.  SEEK IMMEDIATE MEDICAL CARE IF:  You have a fever.   You have increasing pain, severe headaches, or toothache.   You have nausea, vomiting, or drowsiness.   You develop unusual  swelling around the face or trouble seeing.  MAKE SURE YOU:   Understand these instructions.   Will watch your condition.   Will get help right away if you are not doing well or get worse.  Document Released: 01/15/2005 Document Revised: 01/04/2011 Document Reviewed: 08/14/2006 [facility] Patient Information 2012 [place], [place].

## 2011-09-13 NOTE — Progress Notes (Signed)
  Subjective:     [name] is a 31 y.o. female who presents for evaluation of sinus pain, rhinorrhea, cough, and nasal congestion. Symptoms include: cough, nasal congestion, post nasal drip, purulent rhinorrhea, sinus pressure, sniffing and sore throat. Onset of symptoms was 10 days ago. Symptoms have been unchanged since that time. Past history is significant for currently [redacted] weeks pregnant.  Prenatal care @ Dr. Ellyn Hack.  She was started on a Z-pak per [facility] physician which helped for a few days, but then her daughter became sick and patient became symptomatic again.  She checked her temperature last night at 9 PM, it was 100.2.  She has been taking Tylenol and Mucinex with little relief.  Also complains of diarrhea but no nausea or vomiting.  She reports good fetal movement.   Review of Systems Pertinent items are noted in HPI.   Objective:    BP 115/70  Pulse 89  Temp 97.8 F (36.6 C) (Oral)  Wt 194 lb 4.8 oz (88.134 kg)  LMP 01/16/2011 General appearance: alert, cooperative and no distress Head: Normocephalic, without obvious abnormality, atraumatic Ears: normal TM's and external ear canals both ears Nose: Nares normal. Septum midline. Mucosa normal. No drainage or sinus tenderness. Throat: lips, mucosa, and tongue normal; oropharynx erythematous with exudate Lungs: clear to auscultation bilaterally Heart: regular rate and rhythm, S1, S2 normal, no murmur, click, rub or gallop    Assessment:    Acute viral or bacterial sinusitis.    Plan:    Nasal saline sprays. Follow up in 2 weeks or as needed. Antibiotics given - see Problem List.

## 2011-09-20 ENCOUNTER — Ambulatory Visit (INDEPENDENT_AMBULATORY_CARE_PROVIDER_SITE_OTHER): Payer: Medicaid Other | Admitting: Obstetrics and Gynecology

## 2011-09-20 ENCOUNTER — Encounter: Payer: Self-pay | Admitting: Obstetrics and Gynecology

## 2011-09-20 VITALS — BP 100/64 | Wt 199.0 lb

## 2011-09-20 DIAGNOSIS — O321XX0 Maternal care for breech presentation, not applicable or unspecified: Secondary | ICD-10-CM

## 2011-09-20 DIAGNOSIS — Z331 Pregnant state, incidental: Secondary | ICD-10-CM

## 2011-09-20 NOTE — Progress Notes (Signed)
Pt c/o pain in lower abdomen.

## 2011-09-20 NOTE — Progress Notes (Signed)
[redacted]w[redacted]d Few contractions. Return office in 2 weeks. Questionable presentation.  Ultrasound next visit. Patient is better after her psychiatrist changed her medication. Dr. Stefano Gaul

## 2011-10-04 ENCOUNTER — Ambulatory Visit (INDEPENDENT_AMBULATORY_CARE_PROVIDER_SITE_OTHER): Payer: Medicaid Other

## 2011-10-04 ENCOUNTER — Encounter: Payer: Self-pay | Admitting: Obstetrics and Gynecology

## 2011-10-04 ENCOUNTER — Ambulatory Visit (INDEPENDENT_AMBULATORY_CARE_PROVIDER_SITE_OTHER): Payer: Medicaid Other | Admitting: Obstetrics and Gynecology

## 2011-10-04 VITALS — BP 104/64 | Wt 203.0 lb

## 2011-10-04 DIAGNOSIS — Z331 Pregnant state, incidental: Secondary | ICD-10-CM

## 2011-10-04 DIAGNOSIS — IMO0002 Reserved for concepts with insufficient information to code with codable children: Secondary | ICD-10-CM

## 2011-10-04 DIAGNOSIS — O321XX0 Maternal care for breech presentation, not applicable or unspecified: Secondary | ICD-10-CM

## 2011-10-04 LAB — OB RESULTS CONSOLE GBS: GBS: NEGATIVE

## 2011-10-04 NOTE — Progress Notes (Addendum)
[redacted]w[redacted]d GBS today w/sens secondary to PCN allergy EFW 7lbs 5oz >90% AFI 25cm vertex/oblique, post placenta RTO 1wk Labor Precautions and FKCs

## 2011-10-07 LAB — CULTURE, BETA STREP (GROUP B ONLY)

## 2011-10-11 ENCOUNTER — Encounter: Payer: Medicaid Other | Admitting: Obstetrics and Gynecology

## 2011-10-15 ENCOUNTER — Ambulatory Visit (INDEPENDENT_AMBULATORY_CARE_PROVIDER_SITE_OTHER): Payer: Medicaid Other | Admitting: Obstetrics and Gynecology

## 2011-10-15 ENCOUNTER — Other Ambulatory Visit: Payer: Self-pay

## 2011-10-15 ENCOUNTER — Ambulatory Visit (INDEPENDENT_AMBULATORY_CARE_PROVIDER_SITE_OTHER): Payer: Medicaid Other

## 2011-10-15 VITALS — BP 120/70 | Wt 210.0 lb

## 2011-10-15 DIAGNOSIS — O3660X0 Maternal care for excessive fetal growth, unspecified trimester, not applicable or unspecified: Secondary | ICD-10-CM

## 2011-10-15 DIAGNOSIS — IMO0002 Reserved for concepts with insufficient information to code with codable children: Secondary | ICD-10-CM

## 2011-10-15 DIAGNOSIS — O409XX0 Polyhydramnios, unspecified trimester, not applicable or unspecified: Secondary | ICD-10-CM

## 2011-10-15 LAB — US OB FOLLOW UP

## 2011-10-15 NOTE — Progress Notes (Signed)
Ultrasound today AFI  27 cm, 97 percentile vertex posterior placenta BPP 8 out of 8 GBS neg FKCs Labor Precautions RTO 1wk for ROB and u/s for EFW and BPP and AFI

## 2011-10-20 ENCOUNTER — Ambulatory Visit: Payer: Self-pay | Admitting: Internal Medicine

## 2011-10-20 VITALS — BP 118/71 | HR 104 | Temp 97.8°F | Resp 19 | Ht 65.25 in | Wt 209.0 lb

## 2011-10-20 DIAGNOSIS — F39 Unspecified mood [affective] disorder: Secondary | ICD-10-CM

## 2011-10-20 DIAGNOSIS — J40 Bronchitis, not specified as acute or chronic: Secondary | ICD-10-CM

## 2011-10-20 DIAGNOSIS — J9801 Acute bronchospasm: Secondary | ICD-10-CM

## 2011-10-20 DIAGNOSIS — J209 Acute bronchitis, unspecified: Secondary | ICD-10-CM

## 2011-10-20 MED ORDER — AZITHROMYCIN 250 MG PO TABS: ORAL_TABLET | ORAL | Status: DC

## 2011-10-20 MED ORDER — ALBUTEROL SULFATE HFA 108 (90 BASE) MCG/ACT IN AERS: 1.0000 | INHALATION_SPRAY | RESPIRATORY_TRACT | Status: DC | PRN

## 2011-10-20 NOTE — Progress Notes (Signed)
  Subjective:    Patient ID: [name], female    DOB: 09-Aug-1980, 31 y.o.   MRN: [id number]  HPICough/wheezing/fever/mild sore throat Present for 4 days In ninth month of pregnancy   History of asthma/reactive airway disease Review of Systems     Objective:   Physical Exam TMs clear Nares purulent Throat slightly injected No a.c. Or PC nodes Chest with wheezing on forced expiration       Assessment & Plan:  Impression #1 Lorcet respiratory infection with reactive airway disease Meds ordered this encounter  Medications  . albuterol (PROAIR HFA) 108 (90 BASE) MCG/ACT inhaler    Sig: Inhale 1 puff into the lungs every 4 (four) hours as needed. For wheezing    Dispense:  1 Inhaler    Refill:  3  . azithromycin (ZITHROMAX) 250 MG tablet    Sig: As packaged    Dispense:  6 tablet    Refill:  0

## 2011-10-23 ENCOUNTER — Ambulatory Visit (INDEPENDENT_AMBULATORY_CARE_PROVIDER_SITE_OTHER): Payer: Medicaid Other

## 2011-10-23 ENCOUNTER — Ambulatory Visit (INDEPENDENT_AMBULATORY_CARE_PROVIDER_SITE_OTHER): Payer: Medicaid Other | Admitting: Obstetrics and Gynecology

## 2011-10-23 ENCOUNTER — Other Ambulatory Visit: Payer: Self-pay | Admitting: Obstetrics and Gynecology

## 2011-10-23 VITALS — BP 110/72 | Wt 208.0 lb

## 2011-10-23 DIAGNOSIS — O409XX0 Polyhydramnios, unspecified trimester, not applicable or unspecified: Secondary | ICD-10-CM

## 2011-10-23 DIAGNOSIS — J069 Acute upper respiratory infection, unspecified: Secondary | ICD-10-CM

## 2011-10-23 DIAGNOSIS — IMO0002 Reserved for concepts with insufficient information to code with codable children: Secondary | ICD-10-CM

## 2011-10-23 DIAGNOSIS — O3660X0 Maternal care for excessive fetal growth, unspecified trimester, not applicable or unspecified: Secondary | ICD-10-CM

## 2011-10-23 LAB — CBC WITH DIFFERENTIAL/PLATELET
Eosinophils Relative: 1 % (ref 0–5)
HCT: 33.5 % — ABNORMAL LOW (ref 36.0–46.0)
Hemoglobin: 11.4 g/dL — ABNORMAL LOW (ref 12.0–15.0)
Lymphocytes Relative: 24 % (ref 12–46)
Lymphs Abs: 2.2 10*3/uL (ref 0.7–4.0)
MCV: 85.2 fL (ref 78.0–100.0)
Monocytes Absolute: 0.6 10*3/uL (ref 0.1–1.0)
Neutro Abs: 6.2 10*3/uL (ref 1.7–7.7)
RBC: 3.93 MIL/uL (ref 3.87–5.11)
WBC: 9.2 10*3/uL (ref 4.0–10.5)

## 2011-10-23 LAB — US OB FOLLOW UP

## 2011-10-23 NOTE — Progress Notes (Signed)
Pt declines cervix check today.  [redacted]w[redacted]d  U/S:LGA, Polyhydramnios  [id number] (8lb 12oz) +/- 578g  Vertex presentation, posterior placenta,, AFI is Polyhydramnios (>97th%) BPP = 8/8

## 2011-10-23 NOTE — Progress Notes (Signed)
[redacted]w[redacted]d The patient complains of an upper respiratory tract infection.  She is currently being treated with azithromycin.  She says she does not feel any better. Chest: Rhonchi bilaterally.  Check CBC with differential.  If white blood cell count is elevated, then consider a chest x-ray. Ultrasound: Single gestation, polyhydramnios, vertex, 8 lbs. 12 oz. (91st percentile), biophysical profile 8 out of 8. The patient was told that we will consider induction after her next visit.  She declines a cervix check today. Return office in one week. BPP next visit. Dr. Stefano Gaul

## 2011-10-24 LAB — US OB FOLLOW UP

## 2011-10-27 ENCOUNTER — Inpatient Hospital Stay (HOSPITAL_COMMUNITY): Payer: Medicaid Other | Admitting: Anesthesiology

## 2011-10-27 ENCOUNTER — Encounter (HOSPITAL_COMMUNITY): Payer: Self-pay | Admitting: Obstetrics and Gynecology

## 2011-10-27 ENCOUNTER — Inpatient Hospital Stay (HOSPITAL_COMMUNITY)
Admission: AD | Admit: 2011-10-27 | Discharge: 2011-10-29 | DRG: 775 | Disposition: A | Discharge status: Home / Self Care | Payer: Medicaid Other | Source: Ambulatory Visit | Attending: Obstetrics and Gynecology | Admitting: Obstetrics and Gynecology

## 2011-10-27 ENCOUNTER — Encounter (HOSPITAL_COMMUNITY): Payer: Self-pay | Admitting: Anesthesiology

## 2011-10-27 DIAGNOSIS — O4290 Premature rupture of membranes, unspecified as to length of time between rupture and onset of labor, unspecified weeks of gestation: Secondary | ICD-10-CM | POA: Diagnosis present

## 2011-10-27 DIAGNOSIS — Z880 Allergy status to penicillin: Secondary | ICD-10-CM

## 2011-10-27 DIAGNOSIS — O409XX0 Polyhydramnios, unspecified trimester, not applicable or unspecified: Secondary | ICD-10-CM | POA: Diagnosis present

## 2011-10-27 DIAGNOSIS — O3660X0 Maternal care for excessive fetal growth, unspecified trimester, not applicable or unspecified: Secondary | ICD-10-CM | POA: Diagnosis present

## 2011-10-27 LAB — RPR: RPR Ser Ql: NONREACTIVE

## 2011-10-27 LAB — CBC
Hemoglobin: 11.5 g/dL — ABNORMAL LOW (ref 12.0–15.0)
MCHC: 33.3 g/dL (ref 30.0–36.0)
RBC: 3.94 MIL/uL (ref 3.87–5.11)

## 2011-10-27 LAB — PREPARE RBC (CROSSMATCH)

## 2011-10-27 MED ORDER — OXYTOCIN 40 UNITS IN LACTATED RINGERS INFUSION - SIMPLE MED
62.5000 mL/h | Freq: Once | INTRAVENOUS | Status: AC
  Administered 2011-10-28: 62.5 mL/h via INTRAVENOUS

## 2011-10-27 MED ORDER — LIDOCAINE HCL (PF) 1 % IJ SOLN
INTRAMUSCULAR | Status: DC | PRN
  Administered 2011-10-27 (×2): 4 mL

## 2011-10-27 MED ORDER — FAMOTIDINE 20 MG PO TABS
20.0000 mg | ORAL_TABLET | Freq: Every day | ORAL | Status: DC
  Administered 2011-10-27: 20 mg via ORAL
  Filled 2011-10-27: qty 1

## 2011-10-27 MED ORDER — LACTATED RINGERS IV SOLN
INTRAVENOUS | Status: DC
  Administered 2011-10-27 (×2): via INTRAVENOUS
  Administered 2011-10-27: 125 mL/h via INTRAVENOUS

## 2011-10-27 MED ORDER — FLEET ENEMA 7-19 GM/118ML RE ENEM: 1.0000 | ENEMA | RECTAL | Status: DC | PRN

## 2011-10-27 MED ORDER — CITRIC ACID-SODIUM CITRATE 334-500 MG/5ML PO SOLN: 30.0000 mL | ORAL | Status: DC | PRN

## 2011-10-27 MED ORDER — FENTANYL 2.5 MCG/ML BUPIVACAINE 1/10 % EPIDURAL INFUSION (WH - ANES)
INTRAMUSCULAR | Status: DC | PRN
  Administered 2011-10-27: 14 mL/h via EPIDURAL

## 2011-10-27 MED ORDER — LIDOCAINE HCL (PF) 1 % IJ SOLN
30.0000 mL | INTRAMUSCULAR | Status: DC | PRN
  Filled 2011-10-27: qty 30

## 2011-10-27 MED ORDER — ACETAMINOPHEN 325 MG PO TABS
650.0000 mg | ORAL_TABLET | ORAL | Status: DC | PRN
  Administered 2011-10-27: 650 mg via ORAL
  Filled 2011-10-27: qty 2

## 2011-10-27 MED ORDER — OXYCODONE-ACETAMINOPHEN 5-325 MG PO TABS: 1.0000 | ORAL_TABLET | ORAL | Status: DC | PRN

## 2011-10-27 MED ORDER — TERBUTALINE SULFATE 1 MG/ML IJ SOLN: 0.2500 mg | Freq: Once | INTRAMUSCULAR | Status: AC | PRN

## 2011-10-27 MED ORDER — PHENYLEPHRINE 40 MCG/ML (10ML) SYRINGE FOR IV PUSH (FOR BLOOD PRESSURE SUPPORT)
80.0000 ug | PREFILLED_SYRINGE | INTRAVENOUS | Status: DC | PRN
  Filled 2011-10-27: qty 5

## 2011-10-27 MED ORDER — NALBUPHINE SYRINGE 5 MG/0.5 ML: 5.0000 mg | INJECTION | INTRAMUSCULAR | Status: DC | PRN

## 2011-10-27 MED ORDER — LACTATED RINGERS IV SOLN: 500.0000 mL | Freq: Once | INTRAVENOUS | Status: DC

## 2011-10-27 MED ORDER — DIPHENHYDRAMINE HCL 50 MG/ML IJ SOLN
12.5000 mg | INTRAMUSCULAR | Status: DC | PRN
Start: 2011-10-27 — End: 2011-10-28

## 2011-10-27 MED ORDER — LACTATED RINGERS IV SOLN
500.0000 mL | INTRAVENOUS | Status: DC | PRN
Start: 2011-10-27 — End: 2011-10-28

## 2011-10-27 MED ORDER — PHENYLEPHRINE 40 MCG/ML (10ML) SYRINGE FOR IV PUSH (FOR BLOOD PRESSURE SUPPORT): 80.0000 ug | PREFILLED_SYRINGE | INTRAVENOUS | Status: DC | PRN

## 2011-10-27 MED ORDER — EPHEDRINE 5 MG/ML INJ
10.0000 mg | INTRAVENOUS | Status: DC | PRN
  Filled 2011-10-27: qty 4

## 2011-10-27 MED ORDER — IBUPROFEN 600 MG PO TABS
600.0000 mg | ORAL_TABLET | Freq: Four times a day (QID) | ORAL | Status: DC | PRN
  Administered 2011-10-28: 600 mg via ORAL
  Filled 2011-10-27: qty 1

## 2011-10-27 MED ORDER — OXYTOCIN BOLUS FROM INFUSION
500.0000 mL | Freq: Once | INTRAVENOUS | Status: DC
  Filled 2011-10-27: qty 500

## 2011-10-27 MED ORDER — OXYTOCIN 40 UNITS IN LACTATED RINGERS INFUSION - SIMPLE MED
1.0000 m[IU]/min | INTRAVENOUS | Status: DC
  Administered 2011-10-27: 1 m[IU]/min via INTRAVENOUS
  Filled 2011-10-27: qty 1000

## 2011-10-27 MED ORDER — FENTANYL CITRATE 0.05 MG/ML IJ SOLN
100.0000 ug | INTRAMUSCULAR | Status: DC | PRN
  Administered 2011-10-27 (×4): 100 ug via INTRAVENOUS
  Filled 2011-10-27 (×4): qty 2

## 2011-10-27 MED ORDER — CALCIUM CARBONATE ANTACID 500 MG PO CHEW
1.0000 | CHEWABLE_TABLET | Freq: Three times a day (TID) | ORAL | Status: DC | PRN
  Administered 2011-10-27 (×2): 200 mg via ORAL
  Filled 2011-10-27 (×2): qty 2

## 2011-10-27 MED ORDER — ONDANSETRON HCL 4 MG/2ML IJ SOLN
4.0000 mg | Freq: Four times a day (QID) | INTRAMUSCULAR | Status: DC | PRN
  Administered 2011-10-27: 4 mg via INTRAVENOUS
  Filled 2011-10-27: qty 2

## 2011-10-27 MED ORDER — FENTANYL 2.5 MCG/ML BUPIVACAINE 1/10 % EPIDURAL INFUSION (WH - ANES)
14.0000 mL/h | INTRAMUSCULAR | Status: DC
  Administered 2011-10-27: 14 mL/h via EPIDURAL
  Filled 2011-10-27 (×2): qty 60

## 2011-10-27 MED ORDER — EPHEDRINE 5 MG/ML INJ: 10.0000 mg | INTRAVENOUS | Status: DC | PRN

## 2011-10-27 NOTE — MAU Note (Signed)
Patient states she started leaking clear fluid at 0300 and continues to leak. Having irregular contractions. Reports good fetal movement.

## 2011-10-27 NOTE — MAU Note (Signed)
Pt presents to MAU with chief complaint of rupture of membranes. Pt says her water broke at 0300, clear fluid; non odorous.

## 2011-10-27 NOTE — Progress Notes (Signed)
  Subjective: Received single dose of Fentanyl at 10:30am, with benefit. Wants pitocin--"ready to get things going".  Objective: BP 137/60  Pulse 101  Temp 97.9 F (36.6 C) (Oral)  Resp 20  Ht 5' 5.25" (1.657 m)  Wt 204 lb 6.4 oz (92.715 kg)  BMI 33.75 kg/m2  LMP 01/16/2011      FHT:  Category 1 UC:   q 6-7 min, mild. SVE:   Dilation: 2 Effacement (%):  (75) Station: -2 Exam by:: Manfred Arch CNM    Assessment / Plan: SROM, minimal labor GBS negative. Will start pitocin augmentation per low dose protocol. Still prefers IV pain meds.  Nigel Bridgeman 10/27/2011, 12:02 PM

## 2011-10-27 NOTE — Progress Notes (Signed)
  Subjective: Some contractions moderately strong--has some anxiety regarding slow progress of labor.  Objective: BP 114/78  Pulse 85  Temp 97.7 F (36.5 C) (Oral)  Resp 20  Ht 5' 5.25" (1.657 m)  Wt 204 lb 6.4 oz (92.715 kg)  BMI 33.75 kg/m2  LMP 01/16/2011      FHT:  Category 1 UC:   q 4-10 minutes. Pitocin on 5 mu/min VE deferred  Assessment / Plan: Reviewed status and presentation of SROM without labor--reassured patient as to normalcy of labor status, encouraged patience for the process.  Nigel Bridgeman 10/27/2011, 2:33 PM

## 2011-10-27 NOTE — Anesthesia Procedure Notes (Signed)
Epidural Patient location during procedure: OB Start time: 10/27/2011 8:12 PM  Staffing Anesthesiologist: Amaiah Cristiano A. Performed by: anesthesiologist   Preanesthetic Checklist Completed: patient identified, site marked, surgical consent, pre-op evaluation, timeout performed, IV checked, risks and benefits discussed and monitors and equipment checked  Epidural Patient position: sitting Prep: site prepped and draped and DuraPrep Patient monitoring: continuous pulse ox and blood pressure Approach: midline Injection technique: LOR air  Needle:  Needle type: Tuohy  Needle gauge: 17 G Needle length: 9 cm and 9 Needle insertion depth: 5 cm cm Catheter type: closed end flexible Catheter size: 19 Gauge Catheter at skin depth: 10 cm Test dose: negative and Other  Assessment Events: blood not aspirated, injection not painful, no injection resistance, negative IV test and no paresthesia  Additional Notes Patient identified. Risks and benefits discussed including failed block, incomplete  Pain control, post dural puncture headache, nerve damage, paralysis, blood pressure Changes, nausea, vomiting, reactions to medications-both toxic and allergic and post Partum back pain. All questions were answered. Patient expressed understanding and wished to proceed. Sterile technique was used throughout procedure. Epidural site was Dressed with sterile barrier dressing. No paresthesias, signs of intravascular injection Or signs of intrathecal spread were encountered.  Patient was more comfortable after the epidural was dosed. Please see RN's note for documentation of vital signs and FHR which are stable.

## 2011-10-27 NOTE — Progress Notes (Signed)
Comfortable, some pressure O 99.3 R      fhts tachycardia 170-180 variable decels nonrepetitive      abd soft between uc      Contractions q 2-3 mod      Vag C +3 A 2nd stage     Fetal tachycardia P Dr. Pennie Rushing updated as documented will come now as requested. Lavera Guise, CNM

## 2011-10-27 NOTE — Progress Notes (Signed)
Comfortable, some pressure O VSS      fhts 110-120s LTV mod variables, poor tracing      abd soft between uc q 2      Contractions mod      Vag C+2 A 2nd stage P scalp electrode placed easily with pt consent, attempted pushing without descent will labor down, repositioned to L side lying, continue care Lavera Guise, CNM

## 2011-10-27 NOTE — Progress Notes (Signed)
This note also relates to the following rows which could not be included: BP - Cannot attach notes to unvalidated device data Pulse Rate - Cannot attach notes to unvalidated device data    

## 2011-10-27 NOTE — Progress Notes (Signed)
Comfortable, some pressure epidural just in O VSS      fhts category 1 early decels      abd soft between uc      Contractions q 2-3 mild to mod      Vag not assessed A [redacted]w[redacted]d SROM 0300 P discussed infrequent exams, plan IUPC 2 hours after epidural, continue care Lavera Guise, CNM

## 2011-10-27 NOTE — H&P (Signed)
[name] is a 31 y.o. female, G3P1011 at 67 1/7 weeks, presenting for SROM at 3 am, clear fluid, with irregular contractions since.  Report +FM.  Cervix was 1-2 cm on last exam 2 weeks ago.  Reports had spinal HA after last delivery with epidural--wants to avoid epidural this time, if possible--will want IV pain medication.    Prenatal record from last pregnancy (SAB) indicated PPH, with transfusion after delivery.  However, patient now reports no transfusion hx.  Review of d/c summary from that hospitalization--Hgb pp was 7.7, but no txn.  Was induced for pre-eclampsia.  Patient Active Problem List  Diagnosis  . ANXIETY  . DEPRESSION  . ADHD  . Asthma  . Insomnia  . Amenorrhea  . Hx of preeclampsia, prior pregnancy, currently pregnant, unspecified trimester  . PPH (postpartum hemorrhage)  . History of blood transfusion  . Allergy to penicillin  . Sinusitis acute  . LGA (large for gestational age) fetus - 7lbs 5oz >90% (10-04-11)  . SROM  . Polyhydramnios    History of present pregnancy: Patient entered care at 16 weeks.  EDC of 11/02/11 was established by LMP and was in agreement with anatomy US dating.  Anatomy scan was done at 17 4/7 weeks, with normal findings and an posterior placenta.  Further ultrasounds were done at 35 6/7 weeks, with EFW > 90%ile, and mild polyhydramnios.  F/U AFI at 37 weeks showed AFI of 27, 97%ile.  AT 38 weeks, EFW was 8+12, AFI 27.23, 97%ile..  She had a URI at 38 weeks, was treated with Zithromax.  Her prenatal course was essentially otherwise uncomplicated. Glucose testing was WNL.  Patient was followed by psychiatrist for anxiety and depression, was on Lamictal and Seroquel during pregnancy.    Prenatal Transfer Tool  Maternal Diabetes: No Genetic Screening: Declined Maternal Ultrasounds/Referrals: Abnormal:  Findings:   Other:  Polyhydramnios Fetal Ultrasounds or other Referrals:  Other: LGA infant Maternal Substance Abuse:  No Significant  Maternal Medications:  Meds include: Other: Lamictal, Seroquel for anxiety/depression Significant Maternal Lab Results: Lab values include: Group B Strep negative   OB History    Grav Para Term Preterm Abortions TAB SAB Ect Mult Living   3 1 1  1   1  1     2003--ectopic pregnancy 2009--SVB at 38 weeks, 12 hour labor, 7+3, epidural 2011--SAB 1st trimester, cytotech Current  Past Medical History  Diagnosis Date  . Abnormal Pap smear   . Headache   . Anemia   . Anxiety   . PPH (postpartum hemorrhage)   . Asthma   . Blood transfusion     Pt delivery in 2009  . Complication of anesthesia   . Spinal headache    History reviewed. No pertinent past surgical history.  Family History: family history includes Heart disease in her maternal grandmother and paternal grandfather; Hypertension in her father and paternal grandfather; and Thrombophlebitis in her mother.  Social History:  reports that she has quit smoking. She has never used smokeless tobacco. She reports that she does not drink alcohol or use illicit drugs.  Patient is single,  FOB not present with her today.  Patient is currently unemployed. Her mother is present with her today.  Patient is Caucasian, of the [place] faith, with several years of college.  ROS:  Leaking clear fluid, +FM, sporadic contractions.  Allergies  Allergen Reactions  . Codeine Nausea And Vomiting  . Amoxicillin Rash  . Keflex (Cephalexin) Rash  . Penicillins Rash  Dilation: 2 Effacement (%):  (75) Station: -2 Exam by:: Manfred Arch CNM   Blood pressure 118/87, pulse 100, temperature 96.9 F (36.1 C), temperature source Oral, resp. rate 18, height 5' 5.25" (1.657 m), weight 204 lb 6.4 oz (92.715 kg), last menstrual period 01/16/2011.  Chest clear Heart RRR without murmur Abd gravid, NT, FH 42 cm Pelvic: Leaking clear fluid, cervix 2 cm, 75%, vtx, -2. Ext: DTR 2+ without clonus, 1+ edema  FHR: Category 1 UCs:  Irregular,  mild.  Prenatal labs: ABO, Rh:  AB + Antibody:  Neg Rubella:   Immune RPR: NON REAC (07/24 1157)  HBsAg: Negative (03/27 0000)  HIV: Non-reactive (03/27 0000)  GBS: Negative (09/05 0000) Sickle cell/Hgb electrophoresis:  NA Pap:  WNL 05/08/11 GC:  Neg at NOB Chlamydia:  Neg at [facility] Genetic screenings:  Declined Glucola:  131 Other:  Hgb at NOB 12.6, 11.4 at 28 weeks.    Assessment/Plan: IUP at 39 1/7 weeks SROM at 3 am, clear fluid Early labor GBS negative LGA infant/polyhydramnios  Plan: Admit to Birthing Suite per consult with Dr. Pennie Rushing Routine [facility] orders Plan IV access, due to hx of PPH and need for blood transfusion, plan for IV pain medication. Augment if no increase in labor by noon.  Nyra Capes, [place] 10/27/2011, 9:17 AM

## 2011-10-27 NOTE — Anesthesia Preprocedure Evaluation (Signed)
Anesthesia Evaluation  Patient identified by MRN, date of birth, ID band Patient awake    Reviewed: Allergy & Precautions, H&P , Patient's Chart, lab work & pertinent test results  History of Anesthesia Complications (+) POST - OP SPINAL HEADACHE  Airway Mallampati: III TM Distance: >3 FB Neck ROM: full    Dental No notable dental hx. (+) Teeth Intact   Pulmonary asthma ,  breath sounds clear to auscultation  Pulmonary exam normal       Cardiovascular negative cardio ROS  Rhythm:regular Rate:Normal     Neuro/Psych  Headaches, PSYCHIATRIC DISORDERS Anxiety Depression ADHD   GI/Hepatic negative GI ROS, Neg liver ROS,   Endo/Other  negative endocrine ROS  Renal/GU negative Renal ROS  negative genitourinary   Musculoskeletal negative musculoskeletal ROS (+)   Abdominal Normal abdominal exam  (+)   Peds  Hematology negative hematology ROS (+)   Anesthesia Other Findings   Reproductive/Obstetrics (+) Pregnancy Hx/o PPH with Blood Transfusion                           Anesthesia Physical Anesthesia Plan  ASA: II  Anesthesia Plan: Epidural   Post-op Pain Management:    Induction:   Airway Management Planned:   Additional Equipment:   Intra-op Plan:   Post-operative Plan:   Informed Consent: I have reviewed the patients History and Physical, chart, labs and discussed the procedure including the risks, benefits and alternatives for the proposed anesthesia with the patient or authorized representative who has indicated his/her understanding and acceptance.     Plan Discussed with: Anesthesiologist  Anesthesia Plan Comments:         Anesthesia Quick Evaluation

## 2011-10-28 ENCOUNTER — Encounter (HOSPITAL_COMMUNITY): Payer: Self-pay

## 2011-10-28 LAB — CBC
MCHC: 33.4 g/dL (ref 30.0–36.0)
Platelets: 155 10*3/uL (ref 150–400)
RDW: 16.2 % — ABNORMAL HIGH (ref 11.5–15.5)
WBC: 12 10*3/uL — ABNORMAL HIGH (ref 4.0–10.5)

## 2011-10-28 MED ORDER — IBUPROFEN 600 MG PO TABS
600.0000 mg | ORAL_TABLET | Freq: Four times a day (QID) | ORAL | Status: DC | PRN
  Administered 2011-10-28 – 2011-10-29 (×3): 600 mg via ORAL
  Filled 2011-10-28 (×4): qty 1

## 2011-10-28 MED ORDER — ONDANSETRON HCL 4 MG/2ML IJ SOLN: 4.0000 mg | INTRAMUSCULAR | Status: DC | PRN

## 2011-10-28 MED ORDER — WITCH HAZEL-GLYCERIN EX PADS: 1.0000 "application " | MEDICATED_PAD | CUTANEOUS | Status: DC | PRN

## 2011-10-28 MED ORDER — LORAZEPAM 1 MG PO TABS
2.0000 mg | ORAL_TABLET | Freq: Three times a day (TID) | ORAL | Status: DC | PRN
  Administered 2011-10-28: 2 mg via ORAL
  Filled 2011-10-28: qty 2

## 2011-10-28 MED ORDER — SENNOSIDES-DOCUSATE SODIUM 8.6-50 MG PO TABS
2.0000 | ORAL_TABLET | Freq: Every day | ORAL | Status: DC
  Administered 2011-10-28 (×2): 2 via ORAL

## 2011-10-28 MED ORDER — ONDANSETRON HCL 4 MG PO TABS: 4.0000 mg | ORAL_TABLET | ORAL | Status: DC | PRN

## 2011-10-28 MED ORDER — LAMOTRIGINE 100 MG PO TABS
200.0000 mg | ORAL_TABLET | Freq: Every day | ORAL | Status: DC
  Administered 2011-10-28 – 2011-10-29 (×2): 200 mg via ORAL
  Filled 2011-10-28 (×2): qty 2

## 2011-10-28 MED ORDER — PRENATAL MULTIVITAMIN CH
1.0000 | ORAL_TABLET | Freq: Every day | ORAL | Status: DC
  Administered 2011-10-28 – 2011-10-29 (×2): 1 via ORAL
  Filled 2011-10-28 (×2): qty 1

## 2011-10-28 MED ORDER — CALCIUM CARBONATE ANTACID 500 MG PO CHEW
1.0000 | CHEWABLE_TABLET | Freq: Three times a day (TID) | ORAL | Status: DC | PRN
  Administered 2011-10-28: 400 mg via ORAL
  Filled 2011-10-28: qty 2

## 2011-10-28 MED ORDER — ZOLPIDEM TARTRATE 5 MG PO TABS: 5.0000 mg | ORAL_TABLET | Freq: Every evening | ORAL | Status: DC | PRN

## 2011-10-28 MED ORDER — DIPHENHYDRAMINE HCL 25 MG PO CAPS: 25.0000 mg | ORAL_CAPSULE | Freq: Four times a day (QID) | ORAL | Status: DC | PRN

## 2011-10-28 MED ORDER — BENZOCAINE-MENTHOL 20-0.5 % EX AERO: 1.0000 "application " | INHALATION_SPRAY | CUTANEOUS | Status: DC | PRN

## 2011-10-28 MED ORDER — QUETIAPINE FUMARATE 25 MG PO TABS
25.0000 mg | ORAL_TABLET | Freq: Every day | ORAL | Status: DC
  Administered 2011-10-28 – 2011-10-29 (×2): 25 mg via ORAL
  Filled 2011-10-28 (×2): qty 1

## 2011-10-28 MED ORDER — SIMETHICONE 80 MG PO CHEW: 80.0000 mg | CHEWABLE_TABLET | ORAL | Status: DC | PRN

## 2011-10-28 MED ORDER — ALBUTEROL SULFATE HFA 108 (90 BASE) MCG/ACT IN AERS
1.0000 | INHALATION_SPRAY | RESPIRATORY_TRACT | Status: DC | PRN
  Administered 2011-10-28: 1 via RESPIRATORY_TRACT
  Filled 2011-10-28: qty 6.7

## 2011-10-28 MED ORDER — FAMOTIDINE 20 MG PO TABS
20.0000 mg | ORAL_TABLET | Freq: Two times a day (BID) | ORAL | Status: DC
  Administered 2011-10-28 – 2011-10-29 (×3): 20 mg via ORAL
  Filled 2011-10-28 (×3): qty 1

## 2011-10-28 MED ORDER — LANOLIN HYDROUS EX OINT: TOPICAL_OINTMENT | CUTANEOUS | Status: DC | PRN

## 2011-10-28 MED ORDER — TETANUS-DIPHTH-ACELL PERTUSSIS 5-2.5-18.5 LF-MCG/0.5 IM SUSP
0.5000 mL | Freq: Once | INTRAMUSCULAR | Status: DC
  Filled 2011-10-28: qty 0.5

## 2011-10-28 MED ORDER — OXYCODONE-ACETAMINOPHEN 5-325 MG PO TABS
1.0000 | ORAL_TABLET | ORAL | Status: DC | PRN
  Administered 2011-10-28: 1 via ORAL
  Filled 2011-10-28: qty 1

## 2011-10-28 MED ORDER — DIBUCAINE 1 % RE OINT: 1.0000 "application " | TOPICAL_OINTMENT | RECTAL | Status: DC | PRN

## 2011-10-28 NOTE — Progress Notes (Signed)
CSW spoke with RN.  MOB had refused her psychotropic medications today.  CSW entered room to check in with MOB and discuss refusal of medications.  MOB became defensive and stated that her meds were the wrong dosage, and then stated no she just did not want to talk right then but agreed to take her medication.  CSW attempted to console MOB and discuss how CSW could support her emotional concerns, however MOB was reluctant to receive help from CSW.  CSW will have weekday SW check in with MOB tom. now that she is back on her medication regimen.   

## 2011-10-28 NOTE — Anesthesia Postprocedure Evaluation (Signed)
  Anesthesia Post-op Note  Patient: [name]  Procedure(s) Performed: * No procedures listed *  Patient Location: PACU and Mother/Baby  Anesthesia Type: Epidural  Level of Consciousness: awake, alert  and oriented  Airway and Oxygen Therapy: Patient Spontanous Breathing  Post-op Pain: mild  Post-op Assessment: Patient's Cardiovascular Status Stable, Respiratory Function Stable, No signs of Nausea or vomiting, Adequate PO intake and Pain level controlled  Post-op Vital Signs: stable  Complications: No apparent anesthesia complications

## 2011-10-28 NOTE — Clinical Social Work Note (Signed)
Clinical Social Work Department PSYCHOSOCIAL ASSESSMENT - MATERNAL/CHILD 10/28/2011  Patient:  [name],[name]  Account Number:  [id number]  Admit Date:  10/27/2011  Childs Name:   [name]    Clinical Social Worker:  Eland Lamantia, LCSW   Date/Time:  10/28/2011 10:00 AM  Date Referred:  10/28/2011   Referral source  Physician     Referred reason  Depression/Anxiety  Psychosocial assessment   Other referral source:    I:  FAMILY / HOME ENVIRONMENT Child's legal guardian:  PARENT  Guardian - Name Guardian - Age Guardian - Address  [name] 31 [address] [place], [place] [address]  [name]     Other household support members/support persons Name Relationship DOB  [name] GRAND MOTHER   [name] SISTER 4 years old   Other support:   MOB reports some limited family support and issues with FOB.    II  PSYCHOSOCIAL DATA Information Source:  Patient Interview  Financial and Community Resources Employment:   Financial resources:  Medicaid If Medicaid - County:  [place] Other  Food Stamps  WIC   School / Grade:   Maternity Care Coordinator / Child Services Coordination / Early Interventions:  Cultural issues impacting care:    III  STRENGTHS Strengths  Compliance with medical plan  Home prepared for Child (including basic supplies)  Adequate Resources   Strength comment:    IV  RISK FACTORS AND CURRENT PROBLEMS Current Problem:  None   Risk Factor & Current Problem Patient Issue Family Issue Risk Factor / Current Problem Comment   N N     V  SOCIAL WORK ASSESSMENT CSW spoke with RN before seeing MOB.  MOB did not have any concerns this a.m. per RN.  CSW discussed with MOB in room about anxiety and depression and if she's had any concerns. MOB reported her medication and that she has continued to see a psychiatrist regular and has an appt set up for the 9th.  She was emotionally appropriate while CSW was in room.  CSW  discussed supplies and family support.  MOB reports no concerns at this time with supplies, however she began to tell CSW issues with FOB.  MOB reports that he has been a wonderful F to her 31 year old, however has been reluctant to be involved with this pregnancy and was not here for the delivery.  MOB stated that she is taking him to court for custody in October and that he is resentful of this.  CSW discussed MOB's M and her support, along with other family.  MOB does acknowledge she has that support if needed, however expresses concern with FOB not being as involved.  CSW instructed MOB to let RN or MD know if she becomes too symptomatic and feels emotionally overwhelmed. No hx of drug use or any current concerns.  CSW will check in again with pt to make sure she is okay emotionally now that she has had infant.      VI SOCIAL WORK PLAN Social Work Plan  No Further Intervention Required / No Barriers to Discharge   Type of pt/family education:   If child protective services report - county:   If child protective services report - date:   Information/referral to community resources comment:   Other social work plan:    

## 2011-10-29 ENCOUNTER — Encounter (HOSPITAL_COMMUNITY): Payer: Self-pay | Admitting: *Deleted

## 2011-10-29 LAB — TYPE AND SCREEN
ABO/RH(D): AB POS
Antibody Screen: NEGATIVE
Unit division: 0

## 2011-10-29 MED ORDER — IBUPROFEN 800 MG PO TABS
800.0000 mg | ORAL_TABLET | Freq: Three times a day (TID) | ORAL | Status: DC | PRN
Start: 2011-10-29 — End: 2012-03-24

## 2011-10-29 MED ORDER — HYDROCODONE-ACETAMINOPHEN 5-500 MG PO TABS: 1.0000 | ORAL_TABLET | ORAL | Status: DC | PRN

## 2011-10-29 MED ORDER — PNEUMOCOCCAL VAC POLYVALENT 25 MCG/0.5ML IJ INJ
0.5000 mL | INJECTION | INTRAMUSCULAR | Status: DC
  Filled 2011-10-29: qty 0.5

## 2011-10-29 MED ORDER — INFLUENZA VIRUS VACC SPLIT PF IM SUSP: 0.5000 mL | INTRAMUSCULAR | Status: DC

## 2011-10-29 MED ORDER — FERROUS SULFATE 325 (65 FE) MG PO TABS: 325.0000 mg | ORAL_TABLET | Freq: Three times a day (TID) | ORAL | Status: DC

## 2011-10-29 NOTE — Progress Notes (Signed)
UR chart review completed.  

## 2011-10-29 NOTE — Progress Notes (Signed)
Post Partum Day 2 Subjective: no complaints, up ad lib, voiding and tolerating PO  Objective: Blood pressure 94/59, pulse 109, temperature 97.7 F (36.5 C), temperature source Axillary, resp. rate 18, height 5' 5.25" (1.657 m), weight 204 lb 6.4 oz (92.715 kg), last menstrual period 01/16/2011, SpO2 100.00%, unknown if currently breastfeeding.  Physical Exam:  General: no distress Lochia: appropriate Uterine Fundus: firm Incision: NA DVT Evaluation: No evidence of DVT seen on physical exam.   Basename 10/28/11 0550 10/27/11 0910  HGB 10.5* 11.5*  HCT 31.4* 34.5*    Assessment/Plan: Discharge home and Contraception BTL papers signed. May want Mirena IUD.   LOS: 2 days   Janine Limbo 10/29/2011, 9:43 AM

## 2011-10-29 NOTE — Discharge Summary (Signed)
Obstetric Discharge Summary Reason for Admission: onset of labor Prenatal Procedures: none Intrapartum Procedures: spontaneous vaginal delivery Postpartum Procedures: none Complications-Operative and Postpartum: 2nd degree perineal laceration Hemoglobin  Date Value Range Status  10/28/2011 10.5* 12.0 - 15.0 g/dL Final  4/54/0981 19.1   Final     HCT  Date Value Range Status  10/28/2011 31.4* 36.0 - 46.0 % Final  04/25/2011 42   Final    Physical Exam:  General: no distress Lochia: appropriate Uterine Fundus: firm Incision: NA DVT Evaluation: No evidence of DVT seen on physical exam.  Discharge Diagnoses: Term Pregnancy-delivered and Anxiety  Discharge Information: Date: 10/29/2011 Activity: pelvic rest Diet: routine Medications: Ibuprofen, Iron, Vicodin and Continue predelivery medications Condition: stable and improved Instructions: refer to practice specific booklet Discharge to: home Follow-up Information    Follow up with [facility]. In 6 weeks.   Contact information:   [address], [address] [place] [address]          Newborn Data: Live born female  Birth Weight: 9 lb 8.9 oz (4335 g) APGAR: 8, 9  Home with mother.  Beyounce Dickens V 10/29/2011, 9:50 AM

## 2011-10-30 ENCOUNTER — Encounter: Payer: Medicaid Other | Admitting: Obstetrics and Gynecology

## 2011-10-30 ENCOUNTER — Other Ambulatory Visit: Payer: Medicaid Other

## 2011-11-27 ENCOUNTER — Ambulatory Visit: Payer: Medicaid Other | Admitting: Obstetrics and Gynecology

## 2011-12-11 ENCOUNTER — Ambulatory Visit: Payer: Medicaid Other | Admitting: Obstetrics and Gynecology

## 2011-12-14 ENCOUNTER — Encounter (HOSPITAL_COMMUNITY): Payer: Self-pay

## 2011-12-14 ENCOUNTER — Emergency Department (INDEPENDENT_AMBULATORY_CARE_PROVIDER_SITE_OTHER)
Admission: EM | Admit: 2011-12-14 | Discharge: 2011-12-14 | Disposition: A | Discharge status: Home / Self Care | Payer: Self-pay | Source: Home / Self Care | Attending: Family Medicine | Admitting: Family Medicine

## 2011-12-14 DIAGNOSIS — J209 Acute bronchitis, unspecified: Secondary | ICD-10-CM

## 2011-12-14 MED ORDER — DOXYCYCLINE HYCLATE 100 MG PO CAPS: 100.0000 mg | ORAL_CAPSULE | Freq: Two times a day (BID) | ORAL | Status: DC

## 2011-12-14 NOTE — ED Notes (Signed)
Concern for URI type syx; ; new baby at home; NAD

## 2011-12-14 NOTE — ED Provider Notes (Signed)
History     CSN: [id number]  Arrival date & time 12/14/11  3086   First MD Initiated Contact with Patient 12/14/11 [phone]      Chief Complaint  Patient presents with  . URI    (Consider location/radiation/quality/duration/timing/severity/associated sxs/prior treatment) Patient is a 31 y.o. female presenting with URI. The history is provided by the patient.  URI The primary symptoms include cough and wheezing. Primary symptoms do not include fever, nausea, vomiting or rash. The current episode started 3 to 5 days ago. This is a recurrent problem. The problem has not changed since onset. Symptoms associated with the illness include congestion and rhinorrhea.    Past Medical History  Diagnosis Date  . Abnormal Pap smear   . Headache   . Anemia   . Anxiety   . PPH (postpartum hemorrhage)   . Asthma   . Blood transfusion     Pt delivery in 2009  . Complication of anesthesia   . Spinal headache     History reviewed. No pertinent past surgical history.  Family History  Problem Relation Age of Onset  . Thrombophlebitis Mother   . Hypertension Father   . Heart disease Maternal Grandmother   . Heart disease Paternal Grandfather   . Hypertension Paternal Grandfather     History  Substance Use Topics  . Smoking status: Former Games developer  . Smokeless tobacco: Never Used  . Alcohol Use: No    OB History    Grav Para Term Preterm Abortions TAB SAB Ect Mult Living   3 2 2  1   1  2       Review of Systems  Constitutional: Negative.  Negative for fever.  HENT: Positive for congestion and rhinorrhea.   Respiratory: Positive for cough and wheezing.   Gastrointestinal: Negative for nausea and vomiting.  Skin: Negative for rash.    Allergies  Codeine; Amoxicillin; Keflex; and Penicillins  Home Medications   Current Outpatient Rx  Name  Route  Sig  Dispense  Refill  . LAMOTRIGINE 200 MG PO TABS   Oral   Take 200 mg by mouth daily.         . ALBUTEROL SULFATE HFA  108 (90 BASE) MCG/ACT IN AERS   Inhalation   Inhale 1 puff into the lungs every 4 (four) hours as needed. For wheezing   1 Inhaler   3   . AZITHROMYCIN 250 MG PO TABS      As packaged   6 tablet   0   . DOXYCYCLINE HYCLATE 100 MG PO CAPS   Oral   Take 1 capsule (100 mg total) by mouth 2 (two) times daily.   20 capsule   0   . FERROUS SULFATE 325 (65 FE) MG PO TABS   Oral   Take 1 tablet (325 mg total) by mouth 3 (three) times daily with meals.   100 tablet   1   . HYDROCODONE-ACETAMINOPHEN 5-500 MG PO TABS   Oral   Take 1-2 tablets by mouth every 4 (four) hours as needed for pain.   30 tablet   0   . IBUPROFEN 800 MG PO TABS   Oral   Take 1 tablet (800 mg total) by mouth every 8 (eight) hours as needed for pain.   50 tablet   1   . PRENATAL MULTIVITAMIN CH   Oral   Take 1 tablet by mouth daily.         . QUETIAPINE FUMARATE 25 MG  PO TABS   Oral   Take 25 mg by mouth daily as needed. insomnia           BP 112/82  Pulse 93  Temp 98.9 F (37.2 C) (Oral)  Resp 19  SpO2 100%  Physical Exam  Nursing note and vitals reviewed. Constitutional: She is oriented to person, place, and time. She appears well-developed and well-nourished.  HENT:  Head: Normocephalic.  Right Ear: External ear normal.  Left Ear: External ear normal.  Nose: Mucosal edema and rhinorrhea present.  Mouth/Throat: Oropharynx is clear and moist.  Eyes: Pupils are equal, round, and reactive to light.  Neck: Normal range of motion. Neck supple.  Cardiovascular: Normal rate, regular rhythm, normal heart sounds and intact distal pulses.   Pulmonary/Chest: She has no decreased breath sounds. She has no wheezes. She has rhonchi. She has no rales.  Neurological: She is alert and oriented to person, place, and time.  Skin: Skin is warm and dry.    ED Course  Procedures (including critical care time)  Labs Reviewed - No data to display No results found.   1. Bronchitis, acute        MDM          Linna Hoff, MD 12/14/11 [phone]

## 2012-03-24 ENCOUNTER — Telehealth: Payer: Self-pay | Admitting: *Deleted

## 2012-03-24 ENCOUNTER — Encounter: Payer: Self-pay | Admitting: Family Medicine

## 2012-03-24 ENCOUNTER — Ambulatory Visit (INDEPENDENT_AMBULATORY_CARE_PROVIDER_SITE_OTHER): Payer: Medicaid Other | Admitting: Family Medicine

## 2012-03-24 VITALS — BP 127/78 | HR 86 | Temp 98.6°F | Ht 65.25 in | Wt 171.0 lb

## 2012-03-24 DIAGNOSIS — N946 Dysmenorrhea, unspecified: Secondary | ICD-10-CM

## 2012-03-24 DIAGNOSIS — J019 Acute sinusitis, unspecified: Secondary | ICD-10-CM | POA: Insufficient documentation

## 2012-03-24 MED ORDER — DOXYCYCLINE HYCLATE 100 MG PO TABS: 100.0000 mg | ORAL_TABLET | Freq: Two times a day (BID) | ORAL | Status: DC

## 2012-03-24 MED ORDER — DOXYCYCLINE HYCLATE 100 MG PO CAPS
100.0000 mg | ORAL_CAPSULE | Freq: Two times a day (BID) | ORAL | Status: DC
Start: 2012-03-24 — End: 2012-04-20

## 2012-03-24 MED ORDER — NAPROXEN 500 MG PO TABS: 500.0000 mg | ORAL_TABLET | Freq: Two times a day (BID) | ORAL | Status: DC

## 2012-03-24 NOTE — Assessment & Plan Note (Signed)
Discussed using during periods, caution against chronic use, gastritis.

## 2012-03-24 NOTE — Telephone Encounter (Signed)
Re-ordered tablets, should not require PA

## 2012-03-24 NOTE — Patient Instructions (Addendum)
Sinusitis  Sinusitis an infection of the air pockets (sinuses) in your face. This can cause puffiness (swelling). It can also cause drainage from your sinuses.    HOME CARE     Only take medicine as told by your doctor.   Drink enough fluids to keep your pee (urine) clear or pale yellow.   Apply moist heat or ice packs for pain relief.   Use salt (saline) nose sprays. The spray will wet the thick fluid in the nose. This can help the sinuses drain.  GET HELP RIGHT AWAY IF:     You have a fever.   Your baby is older than 3 months with a rectal temperature of 102 F (38.9 C) or higher.   Your baby is 3 months old or younger with a rectal temperature of 100.4 F (38 C) or higher.   The pain gets worse.   You get a very bad headache.   You keep throwing up (vomiting).   Your face gets puffy.  MAKE SURE YOU:     Understand these instructions.   Will watch your condition.   Will get help right away if you are not doing well or get worse.  Document Released: 07/04/2007 Document Revised: 04/09/2011 Document Reviewed: 07/04/2007  Exit[employer] Patient Information 2013 [employer].

## 2012-03-24 NOTE — Assessment & Plan Note (Signed)
No recent antibiotic use, history of amox allergy.  Will rx doxycycline x 7 days.  Discussed supportive care.

## 2012-03-24 NOTE — Progress Notes (Signed)
  Subjective:    Patient ID: [name], female    DOB: [birth year], 32 y.o.   MRN: [id number]  HPI 2 week sof nasal congestion, seemed to have improved, but now returned with low grade fever- reports 100/.this AM before taking tylenol.  No dyspnea or cough but notes post nasal drainage.  Worse on left side.    Menstrual cramps:  Requests naprosyn for menstrual cramping.  I have reviewed patient's  PMH, FH, and Social history and Medications as related to this visit. history of well controlled intermittent asthma   Review of Systems See HPI    Objective:   Physical Exam  GEN: Alert & Oriented, No acute distress HEENT: [place]/AT. EOMI, PERRLA, no conjunctival injection or scleral icterus.  Bilateral tympanic membranes intact without erythema or effusion.  .  Nasal congestion noted.   Oropharynx is without erythema or exudates.  No anterior or posterior cervical lymphadenopathy CV:  Regular Rate & Rhythm, no murmur Respiratory:  Normal work of breathing, CTAB         Assessment & Plan:

## 2012-03-24 NOTE — Addendum Note (Signed)
Addended by: Macy Mis on: 03/24/2012 11:27 AM   Modules accepted: Orders

## 2012-03-24 NOTE — Telephone Encounter (Signed)
PA required for Doxycycline . Form given to MD.

## 2012-04-09 ENCOUNTER — Emergency Department (HOSPITAL_COMMUNITY)
Admission: EM | Admit: 2012-04-09 | Discharge: 2012-04-09 | Disposition: A | Discharge status: Home / Self Care | Payer: Medicaid Other

## 2012-04-09 NOTE — ED Notes (Signed)
Pt called no answer 

## 2012-04-10 ENCOUNTER — Ambulatory Visit: Payer: Medicaid Other

## 2012-04-13 ENCOUNTER — Telehealth: Payer: Self-pay | Admitting: Family Medicine
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
## 2012-04-20 ENCOUNTER — Encounter (HOSPITAL_COMMUNITY): Payer: Self-pay | Admitting: *Deleted

## 2012-04-20 ENCOUNTER — Emergency Department (HOSPITAL_COMMUNITY)
Admission: EM | Admit: 2012-04-20 | Discharge: 2012-04-22 | Disposition: A | Discharge status: Other Acute Inpatient Hospital | Payer: Medicaid Other | Attending: Emergency Medicine | Admitting: Emergency Medicine

## 2012-04-20 DIAGNOSIS — Z3202 Encounter for pregnancy test, result negative: Secondary | ICD-10-CM | POA: Insufficient documentation

## 2012-04-20 DIAGNOSIS — Z862 Personal history of diseases of the blood and blood-forming organs and certain disorders involving the immune mechanism: Secondary | ICD-10-CM | POA: Insufficient documentation

## 2012-04-20 DIAGNOSIS — J45909 Unspecified asthma, uncomplicated: Secondary | ICD-10-CM | POA: Insufficient documentation

## 2012-04-20 DIAGNOSIS — F29 Unspecified psychosis not due to a substance or known physiological condition: Secondary | ICD-10-CM

## 2012-04-20 DIAGNOSIS — F411 Generalized anxiety disorder: Secondary | ICD-10-CM | POA: Insufficient documentation

## 2012-04-20 DIAGNOSIS — F603 Borderline personality disorder: Secondary | ICD-10-CM | POA: Insufficient documentation

## 2012-04-20 DIAGNOSIS — Z8742 Personal history of other diseases of the female genital tract: Secondary | ICD-10-CM | POA: Insufficient documentation

## 2012-04-20 DIAGNOSIS — Z87891 Personal history of nicotine dependence: Secondary | ICD-10-CM | POA: Insufficient documentation

## 2012-04-20 DIAGNOSIS — Z5189 Encounter for other specified aftercare: Secondary | ICD-10-CM | POA: Insufficient documentation

## 2012-04-20 DIAGNOSIS — Z8679 Personal history of other diseases of the circulatory system: Secondary | ICD-10-CM | POA: Insufficient documentation

## 2012-04-20 DIAGNOSIS — Z79899 Other long term (current) drug therapy: Secondary | ICD-10-CM | POA: Insufficient documentation

## 2012-04-20 DIAGNOSIS — R4789 Other speech disturbances: Secondary | ICD-10-CM | POA: Insufficient documentation

## 2012-04-20 LAB — COMPREHENSIVE METABOLIC PANEL
ALT: 30 U/L (ref 0–35)
BUN: 13 mg/dL (ref 6–23)
Calcium: 10 mg/dL (ref 8.4–10.5)
Creatinine, Ser: 0.73 mg/dL (ref 0.50–1.10)
GFR calc Af Amer: >90 mL/min (ref >90)
Glucose, Bld: 106 mg/dL — ABNORMAL HIGH (ref 70–99)
Sodium: 140 mEq/L (ref 135–145)
Total Protein: 7.7 g/dL (ref 6.0–8.3)

## 2012-04-20 LAB — ETHANOL: Alcohol, Ethyl (B): <11 mg/dL (ref 0–11)

## 2012-04-20 LAB — RAPID URINE DRUG SCREEN, HOSP PERFORMED
Benzodiazepines: NOT DETECTED
Cocaine: NOT DETECTED
Opiates: NOT DETECTED

## 2012-04-20 LAB — CBC
Hemoglobin: 14.7 g/dL (ref 12.0–15.0)
MCH: 30.3 pg (ref 26.0–34.0)
MCHC: 34.9 g/dL (ref 30.0–36.0)

## 2012-04-20 MED ORDER — LORAZEPAM 1 MG PO TABS
2.0000 mg | ORAL_TABLET | Freq: Once | ORAL | Status: AC
  Administered 2012-04-20: 2 mg via ORAL
  Filled 2012-04-20: qty 2
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
## 2012-04-20 NOTE — ED Notes (Signed)
Pt in blue scrubs - personal effects have been wanded and pt has been wanded by security

## 2012-04-20 NOTE — ED Notes (Signed)
Notified Pt that urine sample is needed

## 2012-04-20 NOTE — ED Notes (Signed)
[id number]> Expected date:<BR> Expected time:<BR> Means of arrival:<BR> Comments:<BR> [employer] combative

## 2012-04-20 NOTE — ED Notes (Signed)
Per conversation with charge nurse . This patient needs to be moved to psych ED asap. 

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
## 2012-04-20 NOTE — ED Notes (Signed)
Pt attempted to urinate without success.  Will try again later.
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
## 2012-04-21 DIAGNOSIS — F909 Attention-deficit hyperactivity disorder, unspecified type: Secondary | ICD-10-CM

## 2012-04-21 DIAGNOSIS — F319 Bipolar disorder, unspecified: Secondary | ICD-10-CM

## 2012-04-21 DIAGNOSIS — F1210 Cannabis abuse, uncomplicated: Secondary | ICD-10-CM

## 2012-04-21 MED ORDER — LAMOTRIGINE 200 MG PO TABS
200.0000 mg | ORAL_TABLET | Freq: Two times a day (BID) | ORAL | Status: DC
  Administered 2012-04-21 – 2012-04-22 (×2): 200 mg via ORAL
  Filled 2012-04-21 (×4): qty 1

## 2012-04-21 MED ORDER — ZOLPIDEM TARTRATE 5 MG PO TABS: 5.0000 mg | ORAL_TABLET | Freq: Every evening | ORAL | Status: DC | PRN

## 2012-04-21 MED ORDER — QUETIAPINE FUMARATE 100 MG PO TABS
200.0000 mg | ORAL_TABLET | Freq: Every day | ORAL | Status: DC
  Administered 2012-04-21: 200 mg via ORAL
  Filled 2012-04-21: qty 2

## 2012-04-21 MED ORDER — ACETAMINOPHEN 325 MG PO TABS: 650.0000 mg | ORAL_TABLET | ORAL | Status: DC | PRN

## 2012-04-21 MED ORDER — LORAZEPAM 1 MG PO TABS
1.0000 mg | ORAL_TABLET | Freq: Three times a day (TID) | ORAL | Status: DC | PRN
  Administered 2012-04-22: 1 mg via ORAL
  Filled 2012-04-21: qty 1

## 2012-04-21 MED ORDER — IBUPROFEN 600 MG PO TABS: 600.0000 mg | ORAL_TABLET | Freq: Three times a day (TID) | ORAL | Status: DC | PRN

## 2012-04-21 MED ORDER — ONDANSETRON HCL 4 MG PO TABS: 4.0000 mg | ORAL_TABLET | Freq: Three times a day (TID) | ORAL | Status: DC | PRN

## 2012-04-21 MED ORDER — ALUM & MAG HYDROXIDE-SIMETH 200-200-20 MG/5ML PO SUSP: 30.0000 mL | ORAL | Status: DC | PRN

## 2012-04-21 MED ORDER — CLONAZEPAM 1 MG PO TABS
1.0000 mg | ORAL_TABLET | Freq: Every day | ORAL | Status: DC
  Administered 2012-04-21: 1 mg via ORAL
  Filled 2012-04-21: qty 1

## 2012-04-21 NOTE — ED Provider Notes (Signed)
Filed Vitals:   04/21/12 0647  BP: 105/76  Pulse: 100  Temp: 97.9 F (36.6 C)  Resp: 20   Pt seen this am. Presented after exhibiting paranoid thoughts and concerns for her children's safety. She is pending psychiatrist evaluation.   Raeford Razor, MD 04/21/12 [phone]

## 2012-04-21 NOTE — [facility]HH Counselor (Signed)
Patients mother Donna Christen) # [phone] calls today with additional information. Sts that patient called her today and seemed psychotic and delusional. She sts that patient was speaking of having a "spiritual advisor and guards". Says that patient is not in touch with reality. Patient fascinated with Obama, dead bodies in the yard, and speaks of obtaining a job with the [employer]. Patient's mother does not want patient discharged as she feels that patient desperately wants help.

## 2012-04-21 NOTE — ED Notes (Signed)
Patient and patient in room 36 was dancing in hallway. Patient and patient in 86 are going in each other rooms after ben told not to. Patient explained that she was moved for her safety. Patient started that she felt safe being around the patient in room 36. Patient started crying stated moving her made her worse. Writer informed patient that the staff are here for safety and care.  Will continue to monitor patient and provide emotional care.

## 2012-04-21 NOTE — Progress Notes (Signed)
Patient has been accepted at [facility] by Jamison Lord NP pending bed availability. 

## 2012-04-21 NOTE — Consult Note (Signed)
Reason for Consult: Paranoid delusions Referring Physician: Dr. Inda Brown is an 32 y.o. female.  HPI: Patient was seen and chart reviewed. Patient came to the [facility] long emergency department with the involuntary commitment petition by [employer]. Patient initially made a 911 call complaining hotcakes had been poisoned through the rent in the house and then she was found in a right, bad weather outside the house with the younger kids, 41 years old and 19 months old. Patient has been talking about children's father Sharia Reeve was living in [place], [place] area and at the same time been hospitalized at [facility] long emergency department as per her  67 years old daughter. Reportedly [name] worked for [employer] and may have killed in his duty etc. Patient has been bizarre in her delusional thinking. Patient has been diagnosed with ADHD since she was teenage years and later with Bipolar disorder, has been received medication management from [facility]. Patient mother and [place] as to her psychotic symptoms and requested to be treated in the hospital.  MSE: Patient appeared calm, cooperative to he, awake, alert, and somewhat confused. Patient has been guarded delusional bizarre thoughts about her children were in danger and her children's father was killed. Patient has a bizarre behavior, staying outside in bad weather with the younger children. Patient denies suicidal onset ideation, intention or plans.  Past Medical History  Diagnosis Date  . Abnormal Pap smear   . Headache   . Anemia   . Anxiety   . PPH (postpartum hemorrhage)   . Asthma   . Blood transfusion     Pt delivery in 2009  . Complication of anesthesia   . Spinal headache     History reviewed. No pertinent past surgical history.  Family History  Problem Relation Age of Onset  . Thrombophlebitis Mother   . Hypertension Father   . Heart disease Maternal Grandmother   . Heart disease Paternal Grandfather    . Hypertension Paternal Grandfather     Social History:  reports that she has quit smoking. She has never used smokeless tobacco. She reports that she does not drink alcohol or use illicit drugs.  Allergies:  Allergies  Allergen Reactions  . Codeine Nausea And Vomiting  . Amoxicillin Rash  . Keflex (Cephalexin) Rash  . Penicillins Rash    Medications: I have reviewed the patient's current medications.  Results for orders placed during the hospital encounter of 04/20/12 (from the past 48 hour(s))  CBC     Status: Abnormal   Collection Time    04/20/12  7:58 PM      Result Value Range   WBC 13.2 (*) 4.0 - 10.5 K/uL   RBC 4.85  3.87 - 5.11 MIL/uL   Hemoglobin 14.7  12.0 - 15.0 g/dL   HCT 16.1  09.6 - 04.5 %   MCV 86.8  78.0 - 100.0 fL   MCH 30.3  26.0 - 34.0 pg   MCHC 34.9  30.0 - 36.0 g/dL   RDW 40.9  81.1 - 91.4 %   Platelets 287  150 - 400 K/uL  COMPREHENSIVE METABOLIC PANEL     Status: Abnormal   Collection Time    04/20/12  7:58 PM      Result Value Range   Sodium 140  135 - 145 mEq/L   Potassium 3.6  3.5 - 5.1 mEq/L   Chloride 104  96 - 112 mEq/L   CO2 22  19 -  32 mEq/L   Glucose, Bld 106 (*) 70 - 99 mg/dL   BUN 13  6 - 23 mg/dL   Creatinine, Ser 1.47  0.50 - 1.10 mg/dL   Calcium 82.9  8.4 - 56.2 mg/dL   Total Protein 7.7  6.0 - 8.3 g/dL   Albumin 4.4  3.5 - 5.2 g/dL   AST 20  0 - 37 U/L   ALT 30  0 - 35 U/L   Alkaline Phosphatase 75  39 - 117 U/L   Total Bilirubin 0.3  0.3 - 1.2 mg/dL   GFR calc non Af Amer >90  >90 mL/min   GFR calc Af Amer >90  >90 mL/min   Comment:            The eGFR has been calculated     using the CKD EPI equation.     This calculation has not been     validated in all clinical     situations.     eGFR's persistently     <90 mL/min signify     possible Chronic Kidney Disease.  ETHANOL     Status: None   Collection Time    04/20/12  7:58 PM      Result Value Range   Alcohol, Ethyl (B) <11  0 - 11 mg/dL   Comment:             LOWEST DETECTABLE LIMIT FOR     SERUM ALCOHOL IS 11 mg/dL     FOR MEDICAL PURPOSES ONLY  URINE RAPID DRUG SCREEN (HOSP PERFORMED)     Status: Abnormal   Collection Time    04/20/12 11:20 PM      Result Value Range   Opiates NONE DETECTED  NONE DETECTED   Cocaine NONE DETECTED  NONE DETECTED   Benzodiazepines NONE DETECTED  NONE DETECTED   Amphetamines POSITIVE (*) NONE DETECTED   Tetrahydrocannabinol POSITIVE (*) NONE DETECTED   Barbiturates NONE DETECTED  NONE DETECTED   Comment:            DRUG SCREEN FOR MEDICAL PURPOSES     ONLY.  IF CONFIRMATION IS NEEDED     FOR ANY PURPOSE, NOTIFY LAB     WITHIN 5 DAYS.                LOWEST DETECTABLE LIMITS     FOR URINE DRUG SCREEN     Drug Class       Cutoff (ng/mL)     Amphetamine      1000     Barbiturate      200     Benzodiazepine   200     Tricyclics       300     Opiates          300     Cocaine          300     THC              50  POCT PREGNANCY, URINE     Status: None   Collection Time    04/20/12 11:35 PM      Result Value Range   Preg Test, Ur NEGATIVE  NEGATIVE   Comment:            THE SENSITIVITY OF THIS     METHODOLOGY IS >24 mIU/mL    No results found.  Positive for ADHD, bipolar, sleep disturbance and Paranoid delusions, and guarded Blood pressure 105/73, pulse  92, temperature 98.3 F (36.8 C), temperature source Oral, resp. rate 18, SpO2 99.00%.   Assessment/Plan: Bipolar disorder most recent episode is psychosis. Attention deficit hyperactivity disorder. Cannabis abuse versus dependence Questionable compliant with medications.  Recommendations: Patient will benefit from the inpatient psychiatric hospitalization for crisis stabilization and medication management. We'll start her home medications Lamictal, Seroquel, and Klonopin in the [facility] long emergency department. We hold ADHD medication as she has been psychotic at this time.  Jacobe Study,JANARDHAHA R. 04/21/2012, 5:23 PM

## 2012-04-22 NOTE — ED Notes (Signed)
[employer]. Sheriff called about transportation to [facility] in [place], [place].  Sheriff to pick up pt. Between 1800-1900 today.  Report called to Angelique Blonder, RN at [place], informed them Kathryne Sharper would not be able to get pt. To [place] until 04/23/12 at 1am.  Lanora Manis at [place] confirmed that would be fine.

## 2012-04-22 NOTE — [facility] Counselor (Signed)
3/25  Pt referred to [name], [address] [place], [facility] [place], [place], [place], [place], [place], [facility], and [facility].   3/25 No beds at [place], [place], [place], [place]-no high acuity beds, [address], [place], [place]-no takings patients for the rest of the week, and [facility]

## 2012-04-22 NOTE — ED Provider Notes (Signed)
Filed Vitals:   04/22/12 0419  BP: 99/56  Pulse: 88  Temp: 97.7 F (36.5 C)  Resp: 18    Assuming care of patient this morning.  Patient in the ED for paranoia and psychoses. Awaiting placement to [facility]. Workup thus far is negative. Patient had no complains, no concerns from the nursing side. Will continue to monitor.  Derwood Kaplan, MD 04/22/12 [phone]

## 2012-04-22 NOTE — Progress Notes (Signed)
Discussed at Long Length of Stay Meeting: Pending [facility] bed availability Accepted by Dr Shaune Pollack at [facility].

## 2012-04-22 NOTE — [facility] Counselor (Signed)
Patient accepted to [facility]. The accepting physician is Dr. Quintella Baton. The call report # is [phone]. Patient awaiting transfer via [facility]. EDP-Dr. Vivia Ewing notified of patients disposition.

## 2012-05-10 ENCOUNTER — Encounter (HOSPITAL_COMMUNITY): Payer: Self-pay | Admitting: Emergency Medicine

## 2012-05-10 ENCOUNTER — Emergency Department (HOSPITAL_COMMUNITY)
Admission: EM | Admit: 2012-05-10 | Discharge: 2012-05-10 | Disposition: A | Discharge status: Home / Self Care | Payer: Medicaid Other | Source: Home / Self Care | Attending: Family Medicine | Admitting: Family Medicine

## 2012-05-10 DIAGNOSIS — R42 Dizziness and giddiness: Secondary | ICD-10-CM

## 2012-05-10 NOTE — ED Notes (Signed)
Pt c/o feeling dizzy for the last couple of hours Sx today include: freq thirst, freq urination, heavy menstruals and bruising all over her legs Denies: inj/trauma to legs  She is alert and oriented w/no signs of acute distress.

## 2012-05-10 NOTE — ED Provider Notes (Signed)
History     CSN: [id number]  Arrival date & time 05/10/12  1533   First MD Initiated Contact with Patient 05/10/12 1552      Chief Complaint  Patient presents with  . Dizziness    (Consider location/radiation/quality/duration/timing/severity/associated sxs/prior treatment) HPI Comments: [name] reports mild dizziness starting this am, and she noticed some bruising on her legs. She is not sure when that happened though reports on injury. The dizziness is mild when she stands. Denies headaches, change in vision or weakness. No ear pain, sinus congestion is noted. No reported history of allergies. She is concerned about a possible infection "somewhere" and wanted to be checked out.    The history is provided by the patient.    Past Medical History  Diagnosis Date  . Abnormal Pap smear   . Headache   . Anemia   . Anxiety   . PPH (postpartum hemorrhage)   . Asthma   . Blood transfusion     Pt delivery in 2009  . Complication of anesthesia   . Spinal headache     History reviewed. No pertinent past surgical history.  Family History  Problem Relation Age of Onset  . Thrombophlebitis Mother   . Hypertension Father   . Heart disease Maternal Grandmother   . Heart disease Paternal Grandfather   . Hypertension Paternal Grandfather     History  Substance Use Topics  . Smoking status: Former Games developer  . Smokeless tobacco: Never Used  . Alcohol Use: No    OB History   Grav Para Term Preterm Abortions TAB SAB Ect Mult Living   3 2 2  1   1  2       Review of Systems  Constitutional: Positive for fatigue. Negative for fever and unexpected weight change.  HENT: Negative for congestion, rhinorrhea, sneezing and postnasal drip.   Eyes: Negative for pain.  Respiratory: Negative for cough.   Gastrointestinal: Negative for nausea and diarrhea.  Genitourinary: Negative for flank pain, vaginal discharge and pelvic pain.  Skin: Negative for rash.  Neurological: Positive for  headaches. Negative for tremors, seizures, syncope, speech difficulty, weakness and light-headedness.    Allergies  Codeine; Amoxicillin; Keflex; and Penicillins  Home Medications   Current Outpatient Rx  Name  Route  Sig  Dispense  Refill  . clonazePAM (KLONOPIN) 1 MG tablet   Oral   Take 0.5-1 mg by mouth at bedtime as needed (sleep).         . lisdexamfetamine (VYVANSE) 70 MG capsule   Oral   Take 70 mg by mouth every morning.         Marland Kitchen QUEtiapine (SEROQUEL) 200 MG tablet   Oral   Take 200 mg by mouth at bedtime as needed.         Marland Kitchen albuterol (PROAIR HFA) 108 (90 BASE) MCG/ACT inhaler   Inhalation   Inhale 1 puff into the lungs every 4 (four) hours as needed. For wheezing   1 Inhaler   3   . lamoTRIgine (LAMICTAL) 200 MG tablet   Oral   Take 400 mg by mouth daily.            BP 120/88  Pulse 118  Temp(Src) 98.3 F (36.8 C) (Oral)  SpO2 91%  LMP 04/26/2012  Breastfeeding? No  Physical Exam  Constitutional: She is oriented to person, place, and time. She appears well-developed and well-nourished.  HENT:  Head: Normocephalic and atraumatic.  Eyes: EOM are normal. Pupils are equal,  round, and reactive to light.  Neck: Normal range of motion. No thyromegaly present.  Cardiovascular: Normal rate and regular rhythm.   Pulmonary/Chest: Effort normal and breath sounds normal.  Lymphadenopathy:    She has no cervical adenopathy.  Neurological: She is alert and oriented to person, place, and time. No cranial nerve deficit. Coordination normal.  Skin: Skin is warm and dry.  Mild bruising on right lower leg. No lesions, no suspicious skin changes, no elevation in the skin  Psychiatric: She has a normal mood and affect. Her behavior is normal.    ED Course  Procedures (including critical care time)  Labs Reviewed - No data to display No results found.   1. Dizziness, nonspecific       MDM  Patient with generalized dizziness and increase in bruising  since the am. No symptoms Of neurologic issues of sings of infection. Follow for now. Reassurance given. If worsens to see treatment.         Azucena Fallen, PA-C 05/10/12 1636

## 2012-05-12 NOTE — ED Provider Notes (Signed)
Medical screening examination/treatment/procedure(s) were performed by resident physician or non-physician practitioner and as supervising physician I was immediately available for consultation/collaboration.   Barkley Bruns MD.   Linna Hoff, MD 05/12/12 [phone]

## 2012-08-28 ENCOUNTER — Encounter (HOSPITAL_COMMUNITY): Payer: Self-pay | Admitting: Adult Health

## 2012-08-28 ENCOUNTER — Emergency Department (HOSPITAL_COMMUNITY)
Admission: EM | Admit: 2012-08-28 | Discharge: 2012-08-28 | Disposition: A | Discharge status: Home / Self Care | Payer: Medicaid Other | Attending: Emergency Medicine | Admitting: Emergency Medicine

## 2012-08-28 DIAGNOSIS — R1310 Dysphagia, unspecified: Secondary | ICD-10-CM | POA: Insufficient documentation

## 2012-08-28 DIAGNOSIS — Z87891 Personal history of nicotine dependence: Secondary | ICD-10-CM | POA: Insufficient documentation

## 2012-08-28 DIAGNOSIS — T7840XA Allergy, unspecified, initial encounter: Secondary | ICD-10-CM

## 2012-08-28 DIAGNOSIS — Z880 Allergy status to penicillin: Secondary | ICD-10-CM | POA: Insufficient documentation

## 2012-08-28 DIAGNOSIS — J029 Acute pharyngitis, unspecified: Secondary | ICD-10-CM | POA: Insufficient documentation

## 2012-08-28 DIAGNOSIS — F411 Generalized anxiety disorder: Secondary | ICD-10-CM | POA: Insufficient documentation

## 2012-08-28 DIAGNOSIS — Z8669 Personal history of other diseases of the nervous system and sense organs: Secondary | ICD-10-CM | POA: Insufficient documentation

## 2012-08-28 DIAGNOSIS — Z79899 Other long term (current) drug therapy: Secondary | ICD-10-CM | POA: Insufficient documentation

## 2012-08-28 DIAGNOSIS — Z8742 Personal history of other diseases of the female genital tract: Secondary | ICD-10-CM | POA: Insufficient documentation

## 2012-08-28 DIAGNOSIS — Z862 Personal history of diseases of the blood and blood-forming organs and certain disorders involving the immune mechanism: Secondary | ICD-10-CM | POA: Insufficient documentation

## 2012-08-28 DIAGNOSIS — J45909 Unspecified asthma, uncomplicated: Secondary | ICD-10-CM | POA: Insufficient documentation

## 2012-08-28 DIAGNOSIS — F419 Anxiety disorder, unspecified: Secondary | ICD-10-CM

## 2012-08-28 MED ORDER — FAMOTIDINE 20 MG PO TABS
20.0000 mg | ORAL_TABLET | Freq: Once | ORAL | Status: AC
  Administered 2012-08-28: 20 mg via ORAL
  Filled 2012-08-28: qty 1

## 2012-08-28 MED ORDER — PREDNISONE 20 MG PO TABS: 40.0000 mg | ORAL_TABLET | Freq: Every day | ORAL | Status: DC

## 2012-08-28 MED ORDER — PREDNISONE 20 MG PO TABS
60.0000 mg | ORAL_TABLET | Freq: Once | ORAL | Status: AC
  Administered 2012-08-28: 60 mg via ORAL
  Filled 2012-08-28: qty 3

## 2012-08-28 MED ORDER — LORAZEPAM 1 MG PO TABS: 1.0000 mg | ORAL_TABLET | ORAL | Status: DC | PRN

## 2012-08-28 MED ORDER — DIPHENHYDRAMINE HCL 50 MG/ML IJ SOLN
25.0000 mg | Freq: Once | INTRAMUSCULAR | Status: AC
  Administered 2012-08-28: 25 mg via INTRAMUSCULAR
  Filled 2012-08-28: qty 1

## 2012-08-28 MED ORDER — DIPHENHYDRAMINE HCL 25 MG PO TABS: 25.0000 mg | ORAL_TABLET | Freq: Four times a day (QID) | ORAL | Status: DC | PRN

## 2012-08-28 MED ORDER — LORAZEPAM 1 MG PO TABS
1.0000 mg | ORAL_TABLET | Freq: Once | ORAL | Status: AC
  Administered 2012-08-28: 1 mg via ORAL
  Filled 2012-08-28: qty 1

## 2012-08-28 MED ORDER — FAMOTIDINE 20 MG PO TABS: 20.0000 mg | ORAL_TABLET | Freq: Two times a day (BID) | ORAL | Status: DC

## 2012-08-28 NOTE — ED Provider Notes (Signed)
CSN: [id number]     Arrival date & time 08/28/12  1536 History     First MD Initiated Contact with Patient 08/28/12 1549     Chief Complaint  Patient presents with  . Allergic Reaction   (Consider location/radiation/quality/duration/timing/severity/associated sxs/prior Treatment) The history is provided by the patient and medical records. No language interpreter was used.    [name] is a 32 y.o. female  with a hx of  ache, anxiety, asthma presents to the Emergency Department complaining of gradual, persistent, progressively worsening sore throat with associated dysphasia, vocal changes and feeling of throat swelling beginning approximately 2 hours prior to arrival. Patient denies known food or environmental allergies. She denies Brown contacts. She denies difficulty breathing.  Patient has a history of previous tonsillectomy. She has not tried taking any medications and nothing seems to make the symptoms better or worse. She states she feels very anxious at this time and has been significant distress for the last several weeks. Pt denies fever, chills, headache, neck stiffness, chest pain, shortness of breath, wheezing, abdominal pain, nausea, vomiting, diarrhea weakness, dizziness, itching, rash.     Past Medical History  Diagnosis Date  . Abnormal Pap smear   . Headache(784.0)   . Anemia   . Anxiety   . PPH (postpartum hemorrhage)   . Asthma   . Blood transfusion     Pt delivery in 2009  . Complication of anesthesia   . Spinal headache    History reviewed. No pertinent past surgical history. Family History  Problem Relation Age of Onset  . Thrombophlebitis Mother   . Hypertension Father   . Heart disease Maternal Grandmother   . Heart disease Paternal Grandfather   . Hypertension Paternal Grandfather    History  Substance Use Topics  . Smoking status: Former Games developer  . Smokeless tobacco: Never Used  . Alcohol Use: No   OB History   Grav Para Term Preterm  Abortions TAB SAB Ect Mult Living   3 2 2  1   1  2      Review of Systems  Constitutional: Negative for fever, diaphoresis, appetite change, fatigue and unexpected weight change.  HENT: Positive for sore throat, trouble swallowing and voice change. Negative for nosebleeds, congestion, rhinorrhea, sneezing, mouth sores, neck stiffness, postnasal drip and sinus pressure.   Eyes: Negative for visual disturbance.  Respiratory: Negative for cough, chest tightness, shortness of breath and wheezing.   Cardiovascular: Negative for chest pain.  Gastrointestinal: Negative for nausea, vomiting, abdominal pain, diarrhea and constipation.  Endocrine: Negative for polydipsia, polyphagia and polyuria.  Genitourinary: Negative for dysuria, urgency, frequency and hematuria.  Musculoskeletal: Negative for back pain.  Skin: Negative for rash.  Allergic/Immunologic: Negative for immunocompromised state.  Neurological: Negative for syncope, light-headedness and headaches.  Hematological: Does not bruise/bleed easily.  Psychiatric/Behavioral: Negative for sleep disturbance. The patient is not nervous/anxious.     Allergies  Codeine; Amoxicillin; Keflex; and Penicillins  Home Medications   Current Outpatient Rx  Name  Route  Sig  Dispense  Refill  . clonazePAM (KLONOPIN) 1 MG tablet   Oral   Take 0.5-1 mg by mouth at bedtime as needed (sleep).         Marland Kitchen lamoTRIgine (LAMICTAL) 200 MG tablet   Oral   Take 400 mg by mouth daily.          Marland Kitchen albuterol (PROAIR HFA) 108 (90 BASE) MCG/ACT inhaler   Inhalation   Inhale 1 puff into the lungs  every 4 (four) hours as needed. For wheezing   1 Inhaler   3   . diphenhydrAMINE (BENADRYL) 25 MG tablet   Oral   Take 1 tablet (25 mg total) by mouth every 6 (six) hours as needed for itching (Rash).   30 tablet   0   . famotidine (PEPCID) 20 MG tablet   Oral   Take 1 tablet (20 mg total) by mouth 2 (two) times daily.   30 tablet   0   . LORazepam  (ATIVAN) 1 MG tablet   Oral   Take 1 tablet (1 mg total) by mouth as needed for anxiety.   3 tablet   0   . predniSONE (DELTASONE) 20 MG tablet   Oral   Take 2 tablets (40 mg total) by mouth daily.   10 tablet   0    BP 112/88  Pulse 81  Temp(Src) 98.3 F (36.8 C) (Oral)  Resp 19  SpO2 98% Physical Exam  Nursing note and vitals reviewed. Constitutional: She is oriented to person, place, and time. She appears well-developed and well-nourished. She appears distressed.  Awake, alert, nontoxic appearance Patient very anxious appearing Tearful  HENT:  Head: Normocephalic and atraumatic.  Right Ear: Hearing, external ear and ear canal normal. Tympanic membrane is scarred.  Left Ear: Hearing, external ear and ear canal normal. Tympanic membrane is scarred.  Nose: Nose normal. No mucosal edema or rhinorrhea.  Mouth/Throat: Uvula is midline, oropharynx is clear and moist and mucous membranes are normal. Mucous membranes are not dry. No edematous. No oropharyngeal exudate, posterior oropharyngeal edema, posterior oropharyngeal erythema or tonsillar abscesses.  Scarred TM bilaterally with history of tympanostomy; no middle ear effusion, erythematous or bulging TM  No swelling of the oropharynx, uvula or soft palate  Eyes: Conjunctivae and EOM are normal. Pupils are equal, round, and reactive to light. Right conjunctiva is not injected. Left conjunctiva is not injected. No scleral icterus.  Neck: Normal range of motion and full passive range of motion without pain. Neck supple. No spinous process tenderness and no muscular tenderness present. No rigidity. No edema, no erythema and normal range of motion present. No Brudzinski's sign and no Kernig's sign noted.  Patient without normal phonation; speaking as if she has cotton in her mouth No stridor Full range of motion of the neck without pain No nuchal rigidity No subcutaneous air of the neck No obvious edema or erythema of the neck    Cardiovascular: Normal rate, regular rhythm, S1 normal, S2 normal, normal heart sounds and intact distal pulses.   Pulses:      Radial pulses are 2+ on the right side, and 2+ on the left side.       Dorsalis pedis pulses are 2+ on the right side, and 2+ on the left side.       Posterior tibial pulses are 2+ on the right side, and 2+ on the left side.  Capillary refill less than 3 seconds No mottling   Pulmonary/Chest: Effort normal and breath sounds normal. No accessory muscle usage or stridor. Not tachypneic. No respiratory distress. She has no decreased breath sounds. She has no wheezes. She has no rhonchi. She has no rales.  No accessory muscle use No wheezing, rhonchi or rails No diminished breath sounds  Abdominal: Soft. Normal appearance and bowel sounds are normal. She exhibits no mass. There is no tenderness. There is no rigidity, no rebound and no guarding.  Musculoskeletal: Normal range of motion. She exhibits  no edema.  Lymphadenopathy:       Head (right side): Tonsillar adenopathy present. No submental, no submandibular, no preauricular, no posterior auricular and no occipital adenopathy present.       Head (left side): Tonsillar adenopathy present. No submental, no submandibular, no preauricular, no posterior auricular and no occipital adenopathy present.    She has no cervical adenopathy.       Right cervical: No superficial cervical, no deep cervical and no posterior cervical adenopathy present.      Left cervical: No superficial cervical, no deep cervical and no posterior cervical adenopathy present.  Mild tonsillar lymphadenopathy No further head or cervical adenopathy  Neurological: She is alert and oriented to person, place, and time. She exhibits normal muscle tone. Coordination normal. GCS eye subscore is 4. GCS verbal subscore is 5. GCS motor subscore is 6.  Speech is clear and goal oriented Moves extremities without ataxia  Skin: Skin is warm and dry. No rash noted.  She is not diaphoretic. No erythema.  No rash, cyanosis or mottling Specifically no petechiae or purpura No urticaria  Psychiatric: She has a normal mood and affect.    ED Course   Procedures (including critical care time)  Labs Reviewed - No data to display No results found. 1. Allergic reaction, initial encounter   2. Anxiety     MDM  [name] presents for allergic reaction.  Patient with poor change in complaints of tongue swelling and throat swelling. Patient given IV Benadryl, Pepcid and Solu-Medrol along with Ativan.    With complete resolution of symptoms approximately 45 minutes after medication administration.  Patient re-evaluated prior to dc, is hemodynamically stable, in no respiratory distress, and denies the feeling of throat closing. Pt has been advised to take OTC benadryl & return to the ED if they have a mod-severe allergic rxn (s/s including throat closing, difficulty breathing, swelling of lips face or tongue). Pt is to follow up with their PCP. Pt is agreeable with plan & verbalizes understanding.  I have also discussed reasons to return immediately to the ER.  Patient expresses understanding and agrees with plan.     Dahlia Client Nimisha Rathel, PA-C 08/29/12 0008

## 2012-08-28 NOTE — ED Notes (Signed)
Pt reports sudden onset of tongue swelling and feeling like her throat is puffy and swelling. No strido, no swelling noted, airway clear, breath sounds clear. Onset 2 hours ago. Pt denies allergies. sats 99%.

## 2012-08-29 NOTE — ED Provider Notes (Signed)
Medical screening examination/treatment/procedure(s) were performed by non-physician practitioner and as supervising physician I was immediately available for consultation/collaboration.   Thaddaeus Granja Y. Duward Allbritton, MD 08/29/12 1548 

## 2012-11-06 ENCOUNTER — Ambulatory Visit (INDEPENDENT_AMBULATORY_CARE_PROVIDER_SITE_OTHER): Payer: Medicaid Other | Admitting: Emergency Medicine

## 2012-11-06 ENCOUNTER — Encounter: Payer: Self-pay | Admitting: Emergency Medicine

## 2012-11-06 VITALS — BP 136/93 | HR 98 | Temp 98.6°F | Ht 65.0 in | Wt 172.0 lb

## 2012-11-06 DIAGNOSIS — R5381 Other malaise: Secondary | ICD-10-CM

## 2012-11-06 DIAGNOSIS — M545 Low back pain, unspecified: Secondary | ICD-10-CM | POA: Insufficient documentation

## 2012-11-06 MED ORDER — MELOXICAM 15 MG PO TABS: 15.0000 mg | ORAL_TABLET | Freq: Every day | ORAL | Status: DC

## 2012-11-06 NOTE — Progress Notes (Signed)
  Subjective:    Patient ID: [name], female    DOB: 03-19-1980, 32 y.o.   MRN: [id number]  HPI RAYAN DYAL is here for a SDA for "not feeling right" x6 months.  She states that she has noticed that she just doesn't feel right since the birth of her second child 1 year ago, more so in the last 6 months.  She reports increased fatigue and feeling "foggy" like "I'm not as sharp as normal."  Has had alternating constipation and diarrhea.  Decreased appetite for the last 2 weeks with intermittent vomiting.  Describes irregular menses and feeling like her vagina is inflamed.  Reports some lower abdominal discomfort and a "bulge" that goes across the lower abdomen.  Has some low back pain that is central and does not radiate.  Intermittent tingling in her hands, but not her legs.  No weakness.  Did have an episode of what sounds like anaphylaxis with mouth and throat swelling that was treated in the ED with epinephrine, benadryl and steroids.  She describes continued swelling/discomfort in her throat with some trouble swallowing.  She does have a history of ADHD and anxiety.  No new medications or recent medication changes.  I have reviewed and updated the following as appropriate: allergies and current medications PMHx: anxiety and depression  FHx: no family history of autoimmune disorders SHx: former smoker  Review of Systems See HPI    Objective:   Physical Exam BP 136/93  Pulse 98  Temp(Src) 98.6 F (37 C) (Oral)  Ht 5\' 5"  (1.651 m)  Wt 172 lb (78.019 kg)  BMI 28.62 kg/m2  LMP 10/24/2012  Breastfeeding? No Gen: alert, cooperative, NAD HEENT: AT/[place], sclera white, MMM Neck: supple, no LAD, thyroid normal CV: RRR, no murmurs Pulm: CTAB, no wheezes or rales Abd: +BS, soft, ND, mild tenderness in RLQ but not consistent Ext: no edema, 2+ DP pulses bilaterally Pelvic: normal external genitalia; normal vaginal mucosa, introitus may be mildly edematous; normal cervix Back: no  erythema or obvious deformity; no point tenderness     Assessment & Plan:

## 2012-11-06 NOTE — Assessment & Plan Note (Addendum)
Likely MSK.  No red flags. Will treat with meloxicam 15mg  daily prn.  Instructed not to take with ibuprofen or other NSAIDs.

## 2012-11-06 NOTE — Assessment & Plan Note (Signed)
Patient has a multitude of non-specific complaints. Could be thyroid dysfunction or unusual presentation of autoimmune disorder.  Suspect may be more related to anxiety and depression.  Patient resistant to the idea of her complaints being secondary to her anxiety. Will check A1c, ESR, CMP, CBC, TSH today. Follow up in 2 weeks to go over the results.

## 2012-11-06 NOTE — Patient Instructions (Signed)
It was nice to meet you!  We are checking some blood work today.  I will call you if anything is wrong. You can take Meloxicam daily for your back.  Do not take with ibuprofen, advil or aleve.  Please follow up with me in 2 weeks.

## 2012-11-07 LAB — CBC WITH DIFFERENTIAL/PLATELET
Basophils Absolute: 0 10*3/uL (ref 0.0–0.1)
Basophils Relative: 0 % (ref 0–1)
HCT: 39.1 % (ref 36.0–46.0)
Hemoglobin: 13.7 g/dL (ref 12.0–15.0)
Lymphocytes Relative: 29 % (ref 12–46)
MCHC: 35 g/dL (ref 30.0–36.0)
Monocytes Absolute: 0.5 10*3/uL (ref 0.1–1.0)
Monocytes Relative: 6 % (ref 3–12)
Neutro Abs: 5.5 10*3/uL (ref 1.7–7.7)
Neutrophils Relative %: 63 % (ref 43–77)
RDW: 13.2 % (ref 11.5–15.5)
WBC: 8.7 10*3/uL (ref 4.0–10.5)

## 2012-11-07 LAB — COMPREHENSIVE METABOLIC PANEL
ALT: 30 U/L (ref 0–35)
AST: 19 U/L (ref 0–37)
Albumin: 4.7 g/dL (ref 3.5–5.2)
Alkaline Phosphatase: 71 U/L (ref 39–117)
BUN: 8 mg/dL (ref 6–23)
Calcium: 9.6 mg/dL (ref 8.4–10.5)
Chloride: 105 mEq/L (ref 96–112)
Potassium: 3.5 mEq/L (ref 3.5–5.3)
Sodium: 137 mEq/L (ref 135–145)
Total Protein: 7.1 g/dL (ref 6.0–8.3)

## 2012-11-13 ENCOUNTER — Encounter: Payer: Self-pay | Admitting: Family Medicine

## 2012-11-13 ENCOUNTER — Telehealth: Payer: Self-pay | Admitting: Family Medicine

## 2012-11-13 NOTE — Telephone Encounter (Signed)
Patient would like her lab results from OV 10/9. Please call patient.

## 2012-11-13 NOTE — Telephone Encounter (Signed)
Called patient and back and told her that all results were normal. Sent a letter with specifics of labs.   [name], PGY-3 Family Medicine Resident

## 2012-11-13 NOTE — Telephone Encounter (Signed)
Will fwd to MD for review.  Cristella Stiver L, CMA  

## 2012-11-20 ENCOUNTER — Ambulatory Visit: Payer: Medicaid Other | Admitting: Emergency Medicine

## 2013-01-20 ENCOUNTER — Ambulatory Visit: Payer: Medicaid Other | Admitting: Family Medicine

## 2013-01-28 ENCOUNTER — Ambulatory Visit: Payer: Medicaid Other | Admitting: Family Medicine

## 2013-02-04 ENCOUNTER — Ambulatory Visit (INDEPENDENT_AMBULATORY_CARE_PROVIDER_SITE_OTHER): Payer: Medicaid Other | Admitting: Family Medicine

## 2013-02-04 ENCOUNTER — Telehealth: Payer: Self-pay | Admitting: *Deleted

## 2013-02-04 ENCOUNTER — Telehealth: Payer: Self-pay | Admitting: Family Medicine

## 2013-02-04 ENCOUNTER — Encounter: Payer: Self-pay | Admitting: Family Medicine

## 2013-02-04 ENCOUNTER — Other Ambulatory Visit (HOSPITAL_COMMUNITY)
Admission: RE | Admit: 2013-02-04 | Discharge: 2013-02-04 | Disposition: A | Discharge status: Home / Self Care | Payer: Medicaid Other | Source: Ambulatory Visit | Attending: Family Medicine | Admitting: Family Medicine

## 2013-02-04 VITALS — BP 137/93 | HR 101 | Temp 98.3°F | Ht 65.0 in | Wt 174.0 lb

## 2013-02-04 DIAGNOSIS — N760 Acute vaginitis: Secondary | ICD-10-CM

## 2013-02-04 DIAGNOSIS — Z113 Encounter for screening for infections with a predominantly sexual mode of transmission: Secondary | ICD-10-CM | POA: Insufficient documentation

## 2013-02-04 DIAGNOSIS — B3731 Acute candidiasis of vulva and vagina: Secondary | ICD-10-CM | POA: Insufficient documentation

## 2013-02-04 DIAGNOSIS — B373 Candidiasis of vulva and vagina: Secondary | ICD-10-CM

## 2013-02-04 DIAGNOSIS — R300 Dysuria: Secondary | ICD-10-CM

## 2013-02-04 DIAGNOSIS — R1032 Left lower quadrant pain: Secondary | ICD-10-CM | POA: Insufficient documentation

## 2013-02-04 LAB — POCT URINALYSIS DIPSTICK
Bilirubin, UA: NEGATIVE
Blood, UA: NEGATIVE
Glucose, UA: NEGATIVE
Ketones, UA: NEGATIVE
LEUKOCYTES UA: NEGATIVE
NITRITE UA: NEGATIVE
PH UA: 6
PROTEIN UA: 100
Spec Grav, UA: 1.025
Urobilinogen, UA: 0.2

## 2013-02-04 LAB — POCT UA - MICROSCOPIC ONLY

## 2013-02-04 LAB — POCT WET PREP (WET MOUNT)
Clue Cells Wet Prep Whiff POC: NEGATIVE
WBC, Wet Prep HPF POC: >20

## 2013-02-04 LAB — POCT URINE PREGNANCY: PREG TEST UR: NEGATIVE

## 2013-02-04 MED ORDER — FLUCONAZOLE 150 MG PO TABS: 150.0000 mg | ORAL_TABLET | Freq: Once | ORAL | Status: DC

## 2013-02-04 NOTE — Patient Instructions (Signed)
Please follow up in 1 month. I am referring you the gastroenterology. I will call you with the results of the vaginal swabs.  I am getting a pelvic and transvaginal ultrasound.

## 2013-02-04 NOTE — Telephone Encounter (Signed)
Please let patient know that her wet prep showed a yeast infection and was otherwise normal and that I am sending a prescription for diflucan: 1 tablet once, to the pharmacy. Thank you!  Marena ChancyStephanie Dina Mobley, PGY-3 Family Medicine Resident

## 2013-02-04 NOTE — Telephone Encounter (Signed)
Pt notified.  Kataleia Quaranta L, CMA  

## 2013-02-04 NOTE — Progress Notes (Signed)
Patient ID: [name]    DOB: 1980/03/12, 33 y.o.   MRN: [id number] --- Subjective:  [name] is a 33 y.o.female who presents with left sided abdominal pain. - Abdominal pain: Located in the left lower abdomen. She describes it as a burning and dull pain. It lasts between 5 minutes and 30 minutes. It occurs 2-3 times a day. It started 2-3 months ago, but she has been more bothered by it in the last few weeks. She reports associated diarrhea since Christmas with loose watery stools 2-3 times per day. She reports blood in the toilet bowl 3 times in the last 6 months. Last occurrence was 2 days ago. She denies any straining or pain when this occurred. She had been having a lot of diarrhea at the time. Prior to her onset of diarrhea, she had been having constipation requiring admission to the ED for disimpaction. She reports that when she has the pain she has bulging of the abdomen that then resolves on its own.  she cannot tell if this is associated when she does heavy lifting. She also reports increased urination up to 30 times a day. Denies any dysuria. She does say that her abdominal pain is sometimes set off when she tries to urinate. She also states that her abdominal pain gets better after she has a bowel movement  -Vaginal discharge: Has been having quite/yellow vaginal discharge in the last couple weeks. This has been associated with a malodorous smell. She has had a history of yeast infections in the past. She tried Monistat over-the-counter which did not completely resolve her discharge.  - Mouth sores: Reports feeling in the last 2 weeks bumps in her posterior tongue he and oropharynx. They're not painful. She just notices them when she flosses. She has never had anything like this before.   ROS: see HPI Past Medical History: reviewed and updated medications and allergies. Past surgical history: No abdominal surgeries Social History: Tobacco: Former smoker Family History: grandmother with  history of colon cancer diagnosed in her mid- 33's.   Objective: Filed Vitals:   02/04/13 0959  BP: 137/93  Pulse: 101  Temp: 98.3 F (36.8 C)    Physical Examination:   General appearance - alert, and in no distress, anxious appearing Mouth - mucous membranes moist, pharynx normal without lesions Neck - supple, no significant adenopathy Chest - clear to auscultation, no wheezes, rales or rhonchi, symmetric air entry Heart - normal rate, regular rhythm, normal S1, S2, no murmurs Abdomen - soft, tender in left lower quadrant: No rebound, No guarding, no CVA tenderness, no abdominal wall defect suggesting hernia Pelvic exam: normal external genitalia, vulva, vagina, cervix, uterus and adnexa. No cervical motion tenderness. Whitish discharge present. Rectal: midline anal tags present, scant stool in vault.  Hemoccult negative, but stool sample very small

## 2013-02-04 NOTE — Assessment & Plan Note (Signed)
Unclear etiology at this point. She does have a history of depression and anxiety which could be playing a role in this. However would like to rule out organic causes. Differential includes IBS versus IBD versus ovarian etiology versus cystitis versus abdominal hernia - UA unremarkable making cystitis very unlikely. Will nonetheless send urine to culture. -No evidence of abdominal defect on exam making this less likely although her description of bulging of the abdomen would suggest this. Will hold on abdominal CT to evaluate this for now - Wet prep showed yeast infection which is unlikely to cause abdominal pain. We'll treat this with Diflucan - check GC/Chl - She does have tenderness to palpation in the left lower quadrant which could point to ovarian etiology. Will obtain pelvic ultrasound to evaluate this further. - With symptoms of alternating diarrhea and constipation as well as rectal bleeding: IBD versus IBS comes to mind. Would like to have her seen by GI to evaluate this further. Hemoccult in the office done did not yield large stool sample but was nonetheless negative. - I did not address depression, anxiety and stress as a possible explanation for this. However once organic causes are excluded: Will address this further. - Patient to followup in one month.

## 2013-02-04 NOTE — Telephone Encounter (Signed)
Called and informed patient of US appointment for 02/06/13 [facility], [address] She was instructed to be NPO after midnight the night before and to arrive at 7:45 for an 8 o'clock appointment.Busick, Rodena Medinobert Lee

## 2013-02-04 NOTE — Assessment & Plan Note (Signed)
Treat with diflucan x 1.  

## 2013-02-05 ENCOUNTER — Telehealth: Payer: Self-pay | Admitting: Family Medicine

## 2013-02-05 LAB — URINE CULTURE
Colony Count: NO GROWTH
Organism ID, Bacteria: NO GROWTH

## 2013-02-05 NOTE — Telephone Encounter (Signed)
Please let patient know that her urine culture, GC/Chl were normal. Thank you!  Marena ChancyStephanie Marisa Hufstetler, PGY-3 Family Medicine Resident

## 2013-02-06 ENCOUNTER — Other Ambulatory Visit: Payer: Medicaid Other

## 2013-02-06 NOTE — Telephone Encounter (Signed)
Pt informed. Lorenda Hatchet.Adom Schoeneck, Renato Battleshekla

## 2013-02-12 ENCOUNTER — Telehealth: Payer: Self-pay | Admitting: *Deleted

## 2013-02-12 ENCOUNTER — Other Ambulatory Visit: Payer: Medicaid Other

## 2013-02-12 ENCOUNTER — Telehealth: Payer: Self-pay | Admitting: Family Medicine

## 2013-02-12 DIAGNOSIS — B3731 Acute candidiasis of vulva and vagina: Secondary | ICD-10-CM

## 2013-02-12 DIAGNOSIS — B373 Candidiasis of vulva and vagina: Secondary | ICD-10-CM

## 2013-02-12 NOTE — Telephone Encounter (Signed)
Radiology at [facility] and is wondering about pt getting her ultrasounds done with them.  The order was originally placed with their location being were it was recommended to be performed but it was scheduled with [facility].  They need to know if it needs to still be done since pt has cancelled appt multiple times.  This order is still sitting in their workqueue.  Please advise if you are still wanting this and if you know by chance why it has been cancelled and rescheduled more than once.  I tried calling pt with no answer and LM for her to call back.  Erika Slaby,CMA

## 2013-02-12 NOTE — Telephone Encounter (Signed)
Will fwd. To PCP. Diflucan was sent 02/04/13.  Lorenda Hatchet.Marcio Hoque, Renato Battleshekla

## 2013-02-12 NOTE — Telephone Encounter (Signed)
Pt called and would like a refill on her diflucan since she still has the yeast infection. Also wanted to know what the status was on her referral for a GI doctor. jw

## 2013-02-12 NOTE — Telephone Encounter (Signed)
I am not sure why patient has been cancelling appointments for ultrasound. We are still awaiting call back from patient to get back in touch with us.  For now, will cancel the ultrasound until patient calls us back. Will act accordingly after hearing back from patient.  GI referral pending at this time.    Marena ChancyStephanie Miller [place], PGY-3 Family Medicine Resident

## 2013-02-12 NOTE — Telephone Encounter (Signed)
Sp[facility] with [facility]ula at [facility] and she is cancelling orders til we hear from pt. Kerrilyn Azbill,CMA

## 2013-02-13 MED ORDER — FLUCONAZOLE 150 MG PO TABS: 150.0000 mg | ORAL_TABLET | Freq: Once | ORAL | Status: DC

## 2013-02-13 NOTE — Telephone Encounter (Signed)
Called pt. Informed. .Koichi Platte  

## 2013-02-13 NOTE — Telephone Encounter (Signed)
Please let patient know that I have sent a prescription for 3 doses of diflucan to take 150 mg every 72 hours. Can we check on the status of the referral and let her know?  Please ask her if she has had the pelvic ultrasound done. Thank you!  Marena ChancyStephanie Atalaya Zappia, PGY-3 Family Medicine Resident

## 2013-02-20 ENCOUNTER — Encounter: Payer: Self-pay | Admitting: Internal Medicine

## 2013-03-12 ENCOUNTER — Encounter: Payer: Self-pay | Admitting: Internal Medicine

## 2013-03-17 ENCOUNTER — Ambulatory Visit: Payer: Medicaid Other | Admitting: Internal Medicine

## 2013-04-23 ENCOUNTER — Encounter: Payer: Self-pay | Admitting: Internal Medicine

## 2013-04-28 ENCOUNTER — Ambulatory Visit: Payer: Medicaid Other | Admitting: Internal Medicine

## 2013-08-13 ENCOUNTER — Ambulatory Visit: Payer: Medicaid Other

## 2013-09-03 ENCOUNTER — Ambulatory Visit: Payer: Medicaid Other | Admitting: Family Medicine

## 2013-09-07 ENCOUNTER — Ambulatory Visit: Payer: Medicaid Other | Admitting: Family Medicine

## 2013-10-12 ENCOUNTER — Encounter: Payer: Self-pay | Admitting: Family Medicine

## 2013-10-12 ENCOUNTER — Ambulatory Visit (INDEPENDENT_AMBULATORY_CARE_PROVIDER_SITE_OTHER): Payer: Medicaid Other | Admitting: Family Medicine

## 2013-10-12 VITALS — BP 102/70 | HR 74 | Temp 97.9°F | Ht 65.0 in | Wt 165.0 lb

## 2013-10-12 DIAGNOSIS — J0110 Acute frontal sinusitis, unspecified: Secondary | ICD-10-CM

## 2013-10-12 DIAGNOSIS — E663 Overweight: Secondary | ICD-10-CM | POA: Insufficient documentation

## 2013-10-12 MED ORDER — FLUTICASONE PROPIONATE 50 MCG/ACT NA SUSP: 2.0000 | Freq: Every day | NASAL | Status: DC

## 2013-10-12 MED ORDER — CETIRIZINE HCL 10 MG PO TABS: 10.0000 mg | ORAL_TABLET | Freq: Every day | ORAL | Status: DC

## 2013-10-12 MED ORDER — PREDNISONE 20 MG PO TABS: 40.0000 mg | ORAL_TABLET | Freq: Every day | ORAL | Status: DC

## 2013-10-12 MED ORDER — ALBUTEROL SULFATE HFA 108 (90 BASE) MCG/ACT IN AERS: 1.0000 | INHALATION_SPRAY | RESPIRATORY_TRACT | Status: DC | PRN

## 2013-10-12 MED ORDER — DOXYCYCLINE HYCLATE 100 MG PO TABS: 100.0000 mg | ORAL_TABLET | Freq: Two times a day (BID) | ORAL | Status: AC

## 2013-10-12 NOTE — Patient Instructions (Addendum)
You likely have a bacterial sinusitis with otitis media (ear infection) and asthma exacerbation resulting from these.  - Take doxycycline twice daily for 7 days. - Take prednisone for 5 days. - For the next 2 days use albuterol every 4 hours. After that, use every 4-6 hours as needed. - Use zyrtec and flonase (nasal steroid) to help with congestion. - Seek immediate care if you have worsened trouble breathing, chest pain, or other concerns. - Otherwise, follow up in 1 week if still not better. - stay hydrated, use nasal saline to try to clean out the sinuses, and get plenty of rest.   - I referred you to nutrition. Call Dr Gerilyn Pilgrim to set up an appointment.  Leona Singleton, MD

## 2013-10-12 NOTE — Progress Notes (Signed)
Patient ID: [name], female   DOB: 02/28/1980, 33 y.o.   MRN: [id number] Subjective:   CC: Head and chest congestion  HPI:   Head and chest congestion [name] is a 33 y.o. female with h/o asthma, anxiety, and ADHD here with sinus pain and chest cold. She reports that for 2 weeks she has had head congestion which moved into her chest and is making it difficult to run in the mornings with increased wheezing. She endorses body aches, productive greenish sputum, and fevers and chills (though these are improving). She has lots of chills at night still and occasionally wakes short of breath. She has had to use albuterol inhaler every few hours. Mucinex D helps for a few hours. She and her family went out of town 2 weeks ago and everyone seems to have gotten sick but only she has persistent symptoms.  Overweight Also, she has been exercising and decreasing soda and carbohydrates for the last >7 months. She is having a hard time getting out of 160s lbs. Interested in nutrition consult.  Review of Systems - Per HPI.   SH: Quit smoking >3 years ago    Objective:  Physical Exam BP 102/70  Pulse 74  Temp(Src) 97.9 F (36.6 C) (Oral)  Ht  (1.651 m)  Wt 165 lb (74.844 kg)  BMI 27.46 kg/m2  SpO2 99%  LMP 09/16/2013 GEN: NAD, pleasant HEENT: AT/Lilly, sclera clear, O/p erythematous but clear, TM left with purulence behind, right with erythema, mild hoarseness, neck supple, right anterior cervical 2cm mobile nontender LN, otherwise shotty LAD anteriorly, frontal sinus TTP PULM: Good air movement but mild tightness throughout, no wheezes or crackles, mild increased work of breathing and occasional cough CV: RRR, no m/r/g EXTR: No LE edema or calf tenderness  Assessment:     [name] is a 33 y.o. female with h/o asthma here for chest and sinus congestion and desiring help with dietary management.    Plan:     Head and chest congestion Hx and physical consistent with  sinusitis with OM, currently more concerning for bacterial given symptoms have not improved during 2 week timecourse of illness. AF in clinic with normal VS. However, also mild asthma exacerbation with tight lungs and increased dyspnea and albuterol use. - Prednisone 5d with h/o asthma and mild tight-sounding lungs - Doxycycline 7 day course for sinusitis with OM - Use albuterol q4 hours x 2 days, then q4-6 hours PRN - Flonase, zyrtec, nasal saline, warm fluids - Tylenol/ibuprofen PRN pain/fever. - return to running slowly to avoid further exacerbating, and with inhaler on hand. - F/u 1 week if not improving and return precautions reviewed for sooner f/u.  Overweight Patient is committed to losing weight and has been exercising daily and cutting back on carbs. She would like a little more help and capitalizing on her motivation right now could be very beneficial. - MNT consult to Dr Gerilyn Pilgrim. - Gave pt her card so she can call to set up an appt.  # Health Maintenance: Weight loss per above  Follow-up: Follow up in 1 week PRN lack of improvement.  Leona Singleton, MD [facility]

## 2013-10-13 DIAGNOSIS — J019 Acute sinusitis, unspecified: Secondary | ICD-10-CM | POA: Insufficient documentation

## 2013-10-13 NOTE — Assessment & Plan Note (Signed)
Patient is committed to losing weight and has been exercising daily and cutting back on carbs. She would like a little more help and capitalizing on her motivation right now could be very beneficial. - MNT consult to Dr Gerilyn Pilgrim. - Gave pt her card so she can call to set up an appt.

## 2013-10-13 NOTE — Assessment & Plan Note (Signed)
Hx and physical consistent with sinusitis with OM, currently more concerning for bacterial given symptoms have not improved during 2 week timecourse of illness. AF in clinic with normal VS. However, also mild asthma exacerbation with tight lungs and increased dyspnea and albuterol use. - Prednisone 5d with h/o asthma and mild tight-sounding lungs - Doxycycline 7 day course for sinusitis with OM - Use albuterol q4 hours x 2 days, then q4-6 hours PRN - Flonase, zyrtec, nasal saline, warm fluids - Tylenol/ibuprofen PRN pain/fever. - return to running slowly to avoid further exacerbating, and with inhaler on hand. - F/u 1 week if not improving and return precautions reviewed for sooner f/u.

## 2013-11-30 ENCOUNTER — Encounter: Payer: Self-pay | Admitting: Family Medicine

## 2014-02-03 ENCOUNTER — Ambulatory Visit (INDEPENDENT_AMBULATORY_CARE_PROVIDER_SITE_OTHER): Payer: Medicaid Other | Admitting: Family Medicine

## 2014-02-03 ENCOUNTER — Encounter: Payer: Self-pay | Admitting: Family Medicine

## 2014-02-03 VITALS — BP 112/84 | HR 101 | Ht 65.0 in | Wt 151.0 lb

## 2014-02-03 DIAGNOSIS — N760 Acute vaginitis: Secondary | ICD-10-CM

## 2014-02-03 DIAGNOSIS — B3731 Acute candidiasis of vulva and vagina: Secondary | ICD-10-CM

## 2014-02-03 DIAGNOSIS — B373 Candidiasis of vulva and vagina: Secondary | ICD-10-CM

## 2014-02-03 LAB — POCT WET PREP (WET MOUNT): CLUE CELLS WET PREP WHIFF POC: NEGATIVE

## 2014-02-03 MED ORDER — FLUCONAZOLE 150 MG PO TABS: 150.0000 mg | ORAL_TABLET | Freq: Once | ORAL | Status: DC

## 2014-02-03 NOTE — Patient Instructions (Signed)

## 2014-02-07 NOTE — Progress Notes (Signed)
   Subjective:    Patient ID: [name], female    DOB: 12-21-1980, 34 y.o.   MRN: [id number]  HPI Pt presents with vaginal itching and white discharge for the past 3-4 days. She reports that she has had yeast infections in the past and that is what this feels like. She is in a monogamous relationship and is not worried about STDs. Mild burning with urination but feels on the outside and no frequency or urgency.   Review of Systems See HPI    Objective:   Physical Exam  Constitutional: She is oriented to person, place, and time. She appears well-developed and well-nourished. No distress.  HENT:  Head: Normocephalic and atraumatic.  Eyes: Conjunctivae are normal. Right eye exhibits no discharge. Left eye exhibits no discharge. No scleral icterus.  Cardiovascular: Normal rate.   Pulmonary/Chest: Effort normal.  Abdominal: Soft. She exhibits no distension. There is no tenderness.  Genitourinary: Uterus normal. No labial fusion. There is no rash, tenderness, lesion or injury on the right labia. There is no rash, tenderness, lesion or injury on the left labia. Cervix exhibits no motion tenderness, no discharge and no friability. No erythema, tenderness or bleeding in the vagina. No foreign body around the vagina. No signs of injury around the vagina. Vaginal discharge found.  Moderate amount of thin white discharge  Neurological: She is alert and oriented to person, place, and time.  Skin: Skin is warm and dry. No rash noted. She is not diaphoretic.  Psychiatric: She has a normal mood and affect. Her behavior is normal.  Nursing note and vitals reviewed.         Assessment & Plan:

## 2014-02-07 NOTE — Assessment & Plan Note (Signed)
Itching and discharge x3 days, wet prep negative - will treat based on symptoms with diflucan x1 - rtc if not improving in 2-3 days

## 2014-02-08 ENCOUNTER — Encounter: Payer: Medicaid Other | Admitting: Family Medicine

## 2014-04-26 ENCOUNTER — Ambulatory Visit (INDEPENDENT_AMBULATORY_CARE_PROVIDER_SITE_OTHER): Payer: Medicaid Other | Admitting: Family Medicine

## 2014-04-26 ENCOUNTER — Encounter: Payer: Self-pay | Admitting: Family Medicine

## 2014-04-26 VITALS — BP 109/72 | HR 68 | Temp 98.4°F | Wt 139.0 lb

## 2014-04-26 DIAGNOSIS — J0100 Acute maxillary sinusitis, unspecified: Secondary | ICD-10-CM

## 2014-04-26 MED ORDER — AZITHROMYCIN 250 MG PO TABS: ORAL_TABLET | ORAL | Status: DC

## 2014-04-26 NOTE — Progress Notes (Signed)
  Subjective:     [name] is a 34 y.o. female who presents for evaluation of sinus pain. Symptoms include: clear rhinorrhea, congestion, facial pain and sniffing. Onset of symptoms was 2 weeks ago. Symptoms have been gradually worsening since that time. Past history is significant for repeat sinusitis. Patient is a non-smoker.  Has tried some flonase and sinus OTC medication w/o much relief   The following portions of the patient's history were reviewed and updated as appropriate: allergies, current medications, past family history, past medical history, past social history, past surgical history and problem list.  Review of Systems Pertinent items are noted in HPI.   Objective:    BP 109/72 mmHg  Pulse 68  Temp(Src) 98.4 F (36.9 C) (Oral)  Wt 139 lb (63.05 kg)  LMP 04/19/2014 (Approximate) General appearance: alert, cooperative and appears stated age Head: Normocephalic, without obvious abnormality Eyes: conjunctivae/corneas clear. PERRL, EOM's intact. Fundi benign. Ears: normal TM's and external ear canals both ears Nose: turbinates red, swollen, + TTP maxillary sinuses  Throat: O/P clear Neck: no adenopathy Lungs: clear to auscultation bilaterally    Assessment:    Acute bacterial sinusitis.    Plan:    Nasal saline sprays. Neti pot recommended. Instructions given. Azithromycin per medication orders. Follow up in 7 days or as needed.   Mucinex 600 mg ES BID, Afrin if needed

## 2014-04-26 NOTE — Patient Instructions (Signed)
Fluticasone nasal spray (Flonase) What is this medicine? FLUTICASONE (floo TIK a sone) is a corticosteroid. This medicine is used to treat the symptoms of allergies like sneezing, itchy red eyes, and itchy, runny, or stuffy nose. This medicine may be used for other purposes; ask your health care provider or pharmacist if you have questions. COMMON BRAND NAME(S): Flonase, Flonase Allergy Relief, Veramyst What should I tell my health care provider before I take this medicine? They need to know if you have any of these conditions: -infection, like tuberculosis, herpes, or fungal infection -recent surgery on nose or sinuses -taking corticosteroid by mouth -an unusual or allergic reaction to fluticasone, steroids, other medicines, foods, dyes, or preservatives -pregnant or trying to get pregnant -breast-feeding How should I use this medicine? This medicine is for use in the nose. Follow the directions on your product or prescription label. This medicine works best if used at regular intervals. Do not use more often than directed. Make sure that you are using your nasal spray correctly. After 6 months of daily use without a prescription, talk to your doctor or health care professional before using it for a longer time. Ask your doctor or health care professional if you have any questions. Talk to your pediatrician regarding the use of this medicine in children. While this drug may be used for children as young as 4 years old for selected conditions, precautions do apply. After 2 months of daily use without a prescription in a child, talk to your pediatrician before using it for a longer time. Overdosage: If you think you have taken too much of this medicine contact a poison control center or emergency room at once. NOTE: This medicine is only for you. Do not share this medicine with others. What if I miss a dose? If you miss a dose, use it as soon as you remember. If it is almost time for your next  dose, use only that dose and continue with your regular schedule. Do not use double or extra doses. What may interact with this medicine? -ketoconazole -metyrapone -some medicines for HIV -vaccines This list may not describe all possible interactions. Give your health care provider a list of all the medicines, herbs, non-prescription drugs, or dietary supplements you use. Also tell them if you smoke, drink alcohol, or use illegal drugs. Some items may interact with your medicine. What should I watch for while using this medicine? Visit your doctor or health care professional for regular checks on your progress. Some symptoms may improve within 12 hours after starting use. Check with your doctor or health care professional if there is no improvement in your condition after 3 weeks of use. Do not come in contact with people who have chickenpox or the measles while you are taking this medicine. If you do, call your doctor right away. What side effects may I notice from receiving this medicine? Side effects that you should report to your doctor or health care professional as soon as possible: -allergic reactions like skin rash, itching or hives, swelling of the face, lips, or tongue -changes in vision -flu-like symptoms -white patches or sores in the mouth or nose Side effects that usually do not require medical attention (report to your doctor or health care professional if they continue or are bothersome): -burning or irritation inside the nose or throat -cough -headache -nosebleed -unusual taste or smell This list may not describe all possible side effects. Call your doctor for medical advice about side effects. You may   report side effects to FDA at 1-800-FDA-1088. Where should I keep my medicine? Keep out of the reach of children. Store at room temperature between 15 and 30 degrees C (59 and 86 degrees F). Throw away any unused medicine after the expiration date. NOTE: This sheet is a  summary. It may not cover all possible information. If you have questions about this medicine, talk to your doctor, pharmacist, or health care provider.  2015, [employer]vier/[employer] Standard. (2013-05-07 15:55:20)  

## 2014-04-26 NOTE — Progress Notes (Signed)
I was one of the preceptor for the morning. 

## 2014-05-26 ENCOUNTER — Emergency Department (HOSPITAL_COMMUNITY)
Admission: EM | Admit: 2014-05-26 | Discharge: 2014-05-27 | Disposition: A | Discharge status: Other Acute Inpatient Hospital | Payer: Medicaid Other | Attending: Emergency Medicine | Admitting: Emergency Medicine

## 2014-05-26 ENCOUNTER — Encounter (HOSPITAL_COMMUNITY): Payer: Self-pay | Admitting: Emergency Medicine

## 2014-05-26 DIAGNOSIS — F1510 Other stimulant abuse, uncomplicated: Secondary | ICD-10-CM | POA: Diagnosis not present

## 2014-05-26 DIAGNOSIS — Z87891 Personal history of nicotine dependence: Secondary | ICD-10-CM | POA: Insufficient documentation

## 2014-05-26 DIAGNOSIS — Z046 Encounter for general psychiatric examination, requested by authority: Secondary | ICD-10-CM | POA: Diagnosis present

## 2014-05-26 DIAGNOSIS — F1210 Cannabis abuse, uncomplicated: Secondary | ICD-10-CM | POA: Insufficient documentation

## 2014-05-26 DIAGNOSIS — Z862 Personal history of diseases of the blood and blood-forming organs and certain disorders involving the immune mechanism: Secondary | ICD-10-CM | POA: Insufficient documentation

## 2014-05-26 DIAGNOSIS — Z79899 Other long term (current) drug therapy: Secondary | ICD-10-CM | POA: Insufficient documentation

## 2014-05-26 DIAGNOSIS — F22 Delusional disorders: Secondary | ICD-10-CM | POA: Insufficient documentation

## 2014-05-26 DIAGNOSIS — J45909 Unspecified asthma, uncomplicated: Secondary | ICD-10-CM | POA: Insufficient documentation

## 2014-05-26 DIAGNOSIS — Z8669 Personal history of other diseases of the nervous system and sense organs: Secondary | ICD-10-CM | POA: Insufficient documentation

## 2014-05-26 DIAGNOSIS — Z791 Long term (current) use of non-steroidal anti-inflammatories (NSAID): Secondary | ICD-10-CM | POA: Diagnosis not present

## 2014-05-26 DIAGNOSIS — Z7951 Long term (current) use of inhaled steroids: Secondary | ICD-10-CM | POA: Diagnosis not present

## 2014-05-26 DIAGNOSIS — F419 Anxiety disorder, unspecified: Secondary | ICD-10-CM | POA: Diagnosis not present

## 2014-05-26 DIAGNOSIS — Z792 Long term (current) use of antibiotics: Secondary | ICD-10-CM | POA: Diagnosis not present

## 2014-05-26 DIAGNOSIS — Z880 Allergy status to penicillin: Secondary | ICD-10-CM | POA: Diagnosis not present

## 2014-05-26 DIAGNOSIS — Z7952 Long term (current) use of systemic steroids: Secondary | ICD-10-CM | POA: Insufficient documentation

## 2014-05-26 LAB — CBC
HCT: 44.1 % (ref 36.0–46.0)
Hemoglobin: 15.1 g/dL — ABNORMAL HIGH (ref 12.0–15.0)
MCH: 30.9 pg (ref 26.0–34.0)
MCHC: 34.2 g/dL (ref 30.0–36.0)
MCV: 90.4 fL (ref 78.0–100.0)
Platelets: 244 10*3/uL (ref 150–400)
RBC: 4.88 MIL/uL (ref 3.87–5.11)
RDW: 12.4 % (ref 11.5–15.5)
WBC: 12.4 10*3/uL — ABNORMAL HIGH (ref 4.0–10.5)

## 2014-05-26 LAB — COMPREHENSIVE METABOLIC PANEL
ALBUMIN: 4.9 g/dL (ref 3.5–5.2)
ALK PHOS: 70 U/L (ref 39–117)
ALT: 24 U/L (ref 0–35)
AST: 27 U/L (ref 0–37)
Anion gap: 11 (ref 5–15)
BUN: 9 mg/dL (ref 6–23)
CO2: 24 mmol/L (ref 19–32)
Calcium: 9.4 mg/dL (ref 8.4–10.5)
Chloride: 104 mmol/L (ref 96–112)
Creatinine, Ser: 0.72 mg/dL (ref 0.50–1.10)
GFR calc non Af Amer: >90 mL/min (ref >90)
GLUCOSE: 120 mg/dL — AB (ref 70–99)
Potassium: 3.2 mmol/L — ABNORMAL LOW (ref 3.5–5.1)
Sodium: 139 mmol/L (ref 135–145)
TOTAL PROTEIN: 7.9 g/dL (ref 6.0–8.3)
Total Bilirubin: 0.7 mg/dL (ref 0.3–1.2)

## 2014-05-26 LAB — RAPID URINE DRUG SCREEN, HOSP PERFORMED
AMPHETAMINES: POSITIVE — AB
Barbiturates: NOT DETECTED
Benzodiazepines: NOT DETECTED
Cocaine: NOT DETECTED
Opiates: NOT DETECTED
TETRAHYDROCANNABINOL: POSITIVE — AB

## 2014-05-26 LAB — ACETAMINOPHEN LEVEL: Acetaminophen (Tylenol), Serum: <10 ug/mL — ABNORMAL LOW (ref 10–30)

## 2014-05-26 LAB — SALICYLATE LEVEL: Salicylate Lvl: <4 mg/dL (ref 2.8–20.0)

## 2014-05-26 LAB — ETHANOL: Alcohol, Ethyl (B): <5 mg/dL (ref 0–9)

## 2014-05-26 MED ORDER — LORAZEPAM 1 MG PO TABS: 1.0000 mg | ORAL_TABLET | ORAL | Status: DC | PRN

## 2014-05-26 MED ORDER — IBUPROFEN 200 MG PO TABS: 600.0000 mg | ORAL_TABLET | Freq: Three times a day (TID) | ORAL | Status: DC | PRN

## 2014-05-26 MED ORDER — ZIPRASIDONE HCL 20 MG PO CAPS
20.0000 mg | ORAL_CAPSULE | Freq: Once | ORAL | Status: AC
  Administered 2014-05-26: 20 mg via ORAL
  Filled 2014-05-26: qty 1

## 2014-05-26 MED ORDER — DIPHENHYDRAMINE HCL 25 MG PO CAPS
50.0000 mg | ORAL_CAPSULE | Freq: Once | ORAL | Status: AC
  Administered 2014-05-26: 50 mg via ORAL
  Filled 2014-05-26: qty 2

## 2014-05-26 MED ORDER — LORAZEPAM 1 MG PO TABS
1.0000 mg | ORAL_TABLET | Freq: Three times a day (TID) | ORAL | Status: DC | PRN
  Administered 2014-05-26: 1 mg via ORAL
  Filled 2014-05-26: qty 1

## 2014-05-26 MED ORDER — ACETAMINOPHEN 325 MG PO TABS: 650.0000 mg | ORAL_TABLET | ORAL | Status: DC | PRN

## 2014-05-26 MED ORDER — POTASSIUM CHLORIDE CRYS ER 20 MEQ PO TBCR
20.0000 meq | EXTENDED_RELEASE_TABLET | Freq: Once | ORAL | Status: AC
  Administered 2014-05-26: 20 meq via ORAL
  Filled 2014-05-26: qty 1

## 2014-05-26 MED ORDER — OLANZAPINE 10 MG PO TBDP
10.0000 mg | ORAL_TABLET | Freq: Three times a day (TID) | ORAL | Status: DC | PRN
  Administered 2014-05-26: 10 mg via ORAL
  Filled 2014-05-26: qty 1

## 2014-05-26 MED ORDER — ZIPRASIDONE MESYLATE 20 MG IM SOLR: 20.0000 mg | INTRAMUSCULAR | Status: DC | PRN

## 2014-05-26 MED ORDER — ALUM & MAG HYDROXIDE-SIMETH 200-200-20 MG/5ML PO SUSP
30.0000 mL | ORAL | Status: DC | PRN
Start: 2014-05-26 — End: 2014-05-27

## 2014-05-26 MED ORDER — ONDANSETRON HCL 4 MG PO TABS: 4.0000 mg | ORAL_TABLET | Freq: Three times a day (TID) | ORAL | Status: DC | PRN

## 2014-05-26 MED ORDER — ZOLPIDEM TARTRATE 5 MG PO TABS: 5.0000 mg | ORAL_TABLET | Freq: Every evening | ORAL | Status: DC | PRN

## 2014-05-26 MED ORDER — NICOTINE 21 MG/24HR TD PT24
21.0000 mg | MEDICATED_PATCH | Freq: Every day | TRANSDERMAL | Status: DC
  Filled 2014-05-26: qty 1

## 2014-05-26 MED ORDER — LORAZEPAM 1 MG PO TABS
2.0000 mg | ORAL_TABLET | Freq: Once | ORAL | Status: DC
  Filled 2014-05-26: qty 2

## 2014-05-26 NOTE — [facility] Counselor (Signed)
Per Bunnie Pionori, AC, [facility] under Dr Elna BreslowEappen.  Counselor informed attending RN and attending EDP, Dr. Elesa MassedWard.   Cyndie MullAnna Sherral Dirocco, [facility] T[facility] Specialist
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
## 2014-05-26 NOTE — Progress Notes (Addendum)
Writer spoke with TTS counselor [name] and CSW [name], and was informed that patient needs placement.  [name], fax referral now. Per [name], pt on wait-list. [facility] - per [name], fax referral, we may have d/c tomorrow. [facility] - per [name], fax referral. Declined at: [facility] - due to out of network for MCD this patient's age. (per [name], adult female beds only but taking referrals for tomorrow.)  [facility] - per [name], fax referral, adult female beds. Per [name], at capacity on all units. [name] only. [facility] - per [name], low acuity beds only.  [facility] - voicemail [facility] - per [name], adult female bed only.  [place] [name], LCSWA Disposition staff 05/26/2014 5:48 PM

## 2014-05-26 NOTE — ED Notes (Signed)
Patient appears irritable. Patient denies SI, HI and AVH. Patient states " My husband put in here for no reasons. He's mad I got a promotion [facility] [employer]". Patient states "I done talking" and laid down on her bed. Encouragement and support provided and safety maintain. Q 15 min safety checks remain in place.
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
## 2014-05-26 NOTE — ED Notes (Signed)
Tobi BastosAnna, TTS  counselor informed that patient does not have 1st opinion.

## 2014-05-26 NOTE — ED Notes (Signed)
Pt restless, yells at staff, demanding and visibly agitated that she's still here. Pt has been very difficult to redirect. Remains intrusive towards staff and peers, labile and impulsive. " your need to send me the hell home, I work for the [employer], why are y'all holding me here". Pt stated " Brittney KonigHillary Brown is my mother and I need to use the phone to get out of here".  All medications given as per MD's orders including PRN Ativan and PRN Zyprexa without adverse reaction. Medication education done prior to administration to increase insight and promote compliance. Encouragement, support and availability offered. Will continue to monitor pt for safety and stabilization of symptoms. Safety maintained on Q 15 minutes checks as ordered.

## 2014-05-26 NOTE — Progress Notes (Signed)
CSW consulted with TTS who states [facility]  [facility].   Accepting Physician: Dr. Elna BreslowEappen  There are ready for the pt to come.  Trish MageBrittney Kamren Heintzelman, LCSWA [phone] ED CSW 05/26/2014 10:54 PM
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
## 2014-05-26 NOTE — Progress Notes (Signed)
CSW was notified by TTS staff that the pt has not been properly caring for her children.   CSW met with patient at bedside. Patient was not able to inform CSW of the exact location of where her children may be.   CSW reached out to the father of the pt's 2 children. He informed CSW that the children are currently with him at his home in [place]. He says that the pt and him share joint custody. However, he says that for the past 2 months the children have been with him more due to the pt's mental health.  Father of the pt's children/[name] states that he does have concerns for the children when they are with the pt. He states that the pt sometimes forgets to bath and feed the kids. Also, he states that the pt has left the children at home alone.   He states that he and the pt have 2 children. A 1 year old girl and a 79 year old boy.  [name] informed CSW that the pt is currently living with her mom. However, he states that he is not sure if the mom will accept her back into the home upon discharge.  CSW will notify CPS.   Father of Children/[name] [phone]  [name] [phone] ED CSW 05/26/2014 9:21 PM

## 2014-05-26 NOTE — ED Provider Notes (Signed)
CSN: [id number]     Arrival date & time 05/26/14  1246 History   First MD Initiated Contact with Patient 05/26/14 1406     Chief Complaint  Patient presents with  . IVC    The history is provided by the patient. No language interpreter was used.  This chart was scribed for non-physician practitioner Clayton Bibles, PA-C, working with Noemi Chapel, MD, by Thea Alken, ED Scribe. This patient was seen in room [facility] and the patient's care was started at 2:07 PM.  [name] is a 34 y.o. female who presents to the Emergency Department for IVC. Pt under IVC. PER IVC FILING:  She was  previously diagnosed with "mental health" illness and has been noncompliant with medication. IVC paper works states she has been acting erratically, the father of her children is terrorist of [employer] and that people are out to get her. Concerned that she is not able to care for children and that she abuses drugs greatly.   Pt states Pollie Meyer took out IVC paper due to being disturbance and because she has "worked for [employer] for several years". She reports she met him 14 years ago, back in college and that he is not the father of her children, but she provided a paternity test to make him think so.  States she agreed to work for [employer] to "take him down" as well as the people he works with.  While in ED, pt has also accused Engineer, structural of working with [employer].  Pt denies feeling sick and states she feels great. Pt denies fever, trouble breathing, CP, SOB, nausea , emesis and abdominal pain.  Past Medical History  Diagnosis Date  . Abnormal Pap smear   . Headache(784.0)   . Anemia   . Anxiety   . PPH (postpartum hemorrhage)   . Asthma   . Blood transfusion     Pt delivery in 2009  . Complication of anesthesia   . Spinal headache    No past surgical history on file. Family History  Problem Relation Age of Onset  . Thrombophlebitis Mother   . Hypertension Father   . Heart disease Maternal  Grandmother   . Heart disease Paternal Grandfather   . Hypertension Paternal Grandfather    History  Substance Use Topics  . Smoking status: Former Research scientist (life sciences)  . Smokeless tobacco: Never Used  . Alcohol Use: No   OB History    Gravida Para Term Preterm AB TAB SAB Ectopic Multiple Living   '3 2 2  1   1  2     ' Review of Systems  Unable to perform ROS: Psychiatric disorder  Constitutional: Negative for fever and chills.  HENT: Negative for congestion.   Respiratory: Negative for cough and shortness of breath.   Cardiovascular: Negative for chest pain.  Gastrointestinal: Negative for nausea, vomiting and abdominal pain.  Musculoskeletal: Negative for myalgias.    Allergies  Codeine; Amoxicillin; Keflex; and Penicillins  Home Medications   Prior to Admission medications   Medication Sig Start Date End Date Taking? Authorizing Provider  albuterol (PROAIR HFA) 108 (90 BASE) MCG/ACT inhaler Inhale 1 puff into the lungs every 4 (four) hours as needed. Dispense with spacer. For wheezing 10/12/13   Hilton Sinclair, MD  azithromycin (ZITHROMAX) 250 MG tablet Two tablets day one, then one tablet daily next 4 days. 04/26/14   Tamela Oddi Hess, DO  cetirizine (ZYRTEC) 10 MG tablet Take 1 tablet (10 mg total) by  mouth daily. 10/12/13   Hilton Sinclair, MD  clonazePAM (KLONOPIN) 1 MG tablet Take 0.5-1 mg by mouth at bedtime as needed (sleep).    Historical Provider, MD  fluconazole (DIFLUCAN) 150 MG tablet Take 1 tablet (150 mg total) by mouth once. Repeat in 3 days if symptoms have not resolved 02/03/14   Frazier Richards, MD  fluticasone [facility]) 50 MCG/ACT nasal spray Place 2 sprays into both nostrils daily. 10/12/13   Hilton Sinclair, MD  lamoTRIgine (LAMICTAL) 200 MG tablet Take 400 mg by mouth daily.     Historical Provider, MD  meloxicam (MOBIC) 15 MG tablet Take 1 tablet (15 mg total) by mouth daily. 11/06/12   Melony Overly, MD  predniSONE (DELTASONE) 20 MG tablet Take 2 tablets (40 mg  total) by mouth daily with breakfast. For 5 days. 10/12/13   Hilton Sinclair, MD  VYVANSE 40 MG capsule Take 40 mg by mouth daily. 04/14/14   Historical Provider, MD   BP 139/93 mmHg  Pulse 105  Temp(Src) 98.3 F (36.8 C) (Oral)  Resp 16  SpO2 99%  LMP 04/19/2014 (Approximate) Physical Exam  Constitutional: She appears well-developed and well-nourished. No distress.  HENT:  Head: Normocephalic and atraumatic.  Neck: Neck supple.  Cardiovascular: Normal rate, regular rhythm, normal heart sounds and intact distal pulses.   Pulmonary/Chest: Effort normal.  Musculoskeletal: She exhibits no edema.  Neurological: She is alert.  Skin: She is not diaphoretic.  Psychiatric: Thought content is paranoid and delusional.  Nursing note and vitals reviewed.   ED Course  Procedures (including critical care time) DIAGNOSTIC STUDIES: Oxygen Saturation is 99% on RA, normal by my interpretation.    COORDINATION OF CARE: 2:06 PM- Pt advised of plan for treatment and pt agrees.  Labs Review Labs Reviewed  ACETAMINOPHEN LEVEL - Abnormal; Notable for the following:    Acetaminophen (Tylenol), Serum <10.0 (*)    All other components within normal limits  CBC - Abnormal; Notable for the following:    WBC 12.4 (*)    Hemoglobin 15.1 (*)    All other components within normal limits  COMPREHENSIVE METABOLIC PANEL - Abnormal; Notable for the following:    Potassium 3.2 (*)    Glucose, Bld 120 (*)    All other components within normal limits  URINE RAPID DRUG SCREEN (HOSP PERFORMED) - Abnormal; Notable for the following:    Amphetamines POSITIVE (*)    Tetrahydrocannabinol POSITIVE (*)    All other components within normal limits  ETHANOL  SALICYLATE LEVEL    Imaging Review No results found.   EKG Interpretation None      MDM   Final diagnoses:  Delusion   Pt with hx unknown psychiatric disorder brought in under IVC claiming she works with the [employer] and seems to think many people  are terrorists.  She has no complaints.  Pt under psych hold, pending assessment.    I personally performed the services described in this documentation, which was scribed in my presence. The recorded information has been reviewed and is accurate.    Clayton Bibles, PA-C 05/26/14 [place], MD 05/27/14 [phone]

## 2014-05-26 NOTE — ED Provider Notes (Signed)
11:16 PM  D/w Tobi BastosAnna with TTS.  Pt [facility] 508[facility] per Dr. Elna BreslowEappen.  Layla MawKristen N Ward, DO 05/26/14 2316

## 2014-05-27 ENCOUNTER — Encounter (HOSPITAL_COMMUNITY): Payer: Self-pay

## 2014-05-27 ENCOUNTER — Inpatient Hospital Stay (HOSPITAL_COMMUNITY)
Admission: EM | Admit: 2014-05-27 | Discharge: 2014-05-31 | DRG: 885 | Disposition: A | Discharge status: Home / Self Care | Payer: Medicaid Other | Source: Intra-hospital | Attending: Psychiatry | Admitting: Psychiatry

## 2014-05-27 DIAGNOSIS — F1590 Other stimulant use, unspecified, uncomplicated: Secondary | ICD-10-CM | POA: Diagnosis present

## 2014-05-27 DIAGNOSIS — F1599 Other stimulant use, unspecified with unspecified stimulant-induced disorder: Secondary | ICD-10-CM

## 2014-05-27 DIAGNOSIS — Z9114 Patient's other noncompliance with medication regimen: Secondary | ICD-10-CM | POA: Diagnosis present

## 2014-05-27 DIAGNOSIS — F3162 Bipolar disorder, current episode mixed, moderate: Secondary | ICD-10-CM | POA: Diagnosis not present

## 2014-05-27 DIAGNOSIS — Z87891 Personal history of nicotine dependence: Secondary | ICD-10-CM

## 2014-05-27 DIAGNOSIS — F29 Unspecified psychosis not due to a substance or known physiological condition: Secondary | ICD-10-CM | POA: Diagnosis present

## 2014-05-27 LAB — LIPID PANEL
Cholesterol: 130 mg/dL (ref 0–200)
HDL: 30 mg/dL — ABNORMAL LOW (ref >39)
LDL Cholesterol: 88 mg/dL (ref 0–99)
Total CHOL/HDL Ratio: 4.3 RATIO
Triglycerides: 61 mg/dL (ref <150)
VLDL: 12 mg/dL (ref 0–40)

## 2014-05-27 LAB — TSH: TSH: 0.941 u[IU]/mL (ref 0.350–4.500)

## 2014-05-27 MED ORDER — ZIPRASIDONE MESYLATE 20 MG IM SOLR: 20.0000 mg | INTRAMUSCULAR | Status: DC | PRN

## 2014-05-27 MED ORDER — ZIPRASIDONE HCL 20 MG PO CAPS
20.0000 mg | ORAL_CAPSULE | Freq: Two times a day (BID) | ORAL | Status: DC
  Administered 2014-05-27 – 2014-05-28 (×2): 20 mg via ORAL
  Filled 2014-05-27 (×4): qty 1

## 2014-05-27 MED ORDER — NICOTINE POLACRILEX 2 MG MT GUM
CHEWING_GUM | OROMUCOSAL | Status: AC
  Administered 2014-05-27: 14:00:00
  Filled 2014-05-27: qty 1

## 2014-05-27 MED ORDER — CARBAMAZEPINE ER 100 MG PO TB12
100.0000 mg | ORAL_TABLET | Freq: Two times a day (BID) | ORAL | Status: DC
  Administered 2014-05-27 – 2014-05-31 (×8): 100 mg via ORAL
  Filled 2014-05-27 (×10): qty 1

## 2014-05-27 MED ORDER — FLUCONAZOLE 150 MG PO TABS
150.0000 mg | ORAL_TABLET | Freq: Once | ORAL | Status: AC
  Administered 2014-05-27: 150 mg via ORAL
  Filled 2014-05-27 (×2): qty 1

## 2014-05-27 MED ORDER — POTASSIUM CHLORIDE CRYS ER 20 MEQ PO TBCR
20.0000 meq | EXTENDED_RELEASE_TABLET | Freq: Two times a day (BID) | ORAL | Status: AC
  Administered 2014-05-27 – 2014-05-28 (×4): 20 meq via ORAL
  Filled 2014-05-27 (×4): qty 1

## 2014-05-27 MED ORDER — MAGNESIUM HYDROXIDE 400 MG/5ML PO SUSP
30.0000 mL | Freq: Every day | ORAL | Status: DC | PRN
  Administered 2014-05-28: 30 mL via ORAL
  Filled 2014-05-27: qty 30

## 2014-05-27 MED ORDER — NICOTINE POLACRILEX 2 MG MT GUM
2.0000 mg | CHEWING_GUM | OROMUCOSAL | Status: DC | PRN
  Administered 2014-05-29 – 2014-05-31 (×8): 2 mg via ORAL
  Filled 2014-05-27 (×5): qty 1

## 2014-05-27 MED ORDER — ALUM & MAG HYDROXIDE-SIMETH 200-200-20 MG/5ML PO SUSP: 30.0000 mL | ORAL | Status: DC | PRN

## 2014-05-27 MED ORDER — LORAZEPAM 1 MG PO TABS
1.0000 mg | ORAL_TABLET | ORAL | Status: AC | PRN
  Administered 2014-05-27: 1 mg via ORAL
  Filled 2014-05-27: qty 1

## 2014-05-27 MED ORDER — CLONAZEPAM 0.5 MG PO TABS
0.5000 mg | ORAL_TABLET | Freq: Two times a day (BID) | ORAL | Status: DC | PRN
  Administered 2014-05-27: 0.5 mg via ORAL
  Filled 2014-05-27: qty 1

## 2014-05-27 MED ORDER — RISPERIDONE 2 MG PO TBDP: 2.0000 mg | ORAL_TABLET | Freq: Three times a day (TID) | ORAL | Status: DC | PRN

## 2014-05-27 MED ORDER — TRAZODONE HCL 50 MG PO TABS
50.0000 mg | ORAL_TABLET | Freq: Every evening | ORAL | Status: DC | PRN
  Administered 2014-05-27 – 2014-05-30 (×4): 50 mg via ORAL
  Filled 2014-05-27 (×10): qty 1

## 2014-05-27 MED ORDER — ACETAMINOPHEN 325 MG PO TABS
650.0000 mg | ORAL_TABLET | Freq: Four times a day (QID) | ORAL | Status: DC | PRN
  Administered 2014-05-27: 650 mg via ORAL
  Filled 2014-05-27: qty 2

## 2014-05-27 NOTE — Progress Notes (Signed)
Patient attended Karaoke. She reported that she had a good day. Does not feel that his medication working. Although she said that she feels calm and she would like something to help her sleep more tonight.  She denied SI/HI and denied Hallucination. Writer encouraged and supported patient. Q 15 minute check continues as ordered to maintain safety.

## 2014-05-27 NOTE — [facility] Counselor (Signed)
Adult Comprehensive Assessment  Patient ID: [name], female   DOB: 1980/10/18, 34 y.o.   MRN: [id number]  Information Source: Information source: Patient  Current Stressors:  Educational / Learning stressors: college graduate, Actuarypanish and sociology, [employer] Employment / Job issues: unemployed at present Family Relationships: states mother and long term friend "[name]" are her biggest IT consultantproblems Financial / Lack of resources (include bankruptcy): Unclear finances Housing / Lack of housing: Says has apartment lease in her name Physical health (include injuries & life threatening diseases): No issues reported Social relationships: conflict w mother and father of her children - afraid she will lose or has lost custody of children Substance abuse: denies use other than occasional drinking Bereavement / Loss: pt keep referring to loss of custody of children and fear that DSS will take away children she cares for now  Living/Environment/Situation:  Living Arrangements: Children (Lives w children (2, 7) at home) Living conditions (as described by patient or guardian): comfortable apartment How long has patient lived in current situation?: patient unable to state What is atmosphere in current home: Comfortable (patient describes self as busy mother and active in community)  Family History:  Marital status: Single Does patient have children?: Yes How many children?: 2 How is patient's relationship with their children?: Says she has two chlidren, [name] (7 and 2).  Repeatedly states that she is concerned that CSW will contact DSS and take away her children.    Childhood History:  By whom was/is the patient raised?: Both parents Additional childhood history information: Patient unable to address in her current state Description of patient's relationship with caregiver when they were a child: Patient unable to address in her current state Patient's description of current  relationship with people who raised him/her: Says her mother is her "biggest problem" but her father, [name], is supportive Does patient have siblings?: No Did patient suffer any verbal/emotional/physical/sexual abuse as a child?: No Did patient suffer from severe childhood neglect?: No Has patient ever been sexually abused/assaulted/raped as an adolescent or adult?: No Was the patient ever a victim of a crime or a disaster?: No Witnessed domestic violence?: No Has patient been effected by domestic violence as an adult?: No  Education:  Highest grade of school patient has completed: Engineer, maintenance (IT)college graduate Currently a Consulting civil engineerstudent?: No Learning disability?: No  Employment/Work Situation:   Employment situation: Unemployed Patient's job has been impacted by current illness: Yes (Says she applied for disability, but application was not approved.  Alludes to mismanagement by lawyers) Describe how patient's job has been impacted: Not working What is the longest time patient has a held a job?: Unknown Where was the patient employed at that time?: NA Has patient ever been in the [employer] and Companymilitary?: No Has patient ever served in Buyer, retailcombat?: No  Financial Resources:   Surveyor, quantityinancial resources: Sales executiveood stamps, Medicaid (Says she will lose at end of May because she is getting a "big settlement" from DSS and will be over income) Does patient have a representative payee or guardian?: No  Alcohol/Substance Abuse:   What has been your use of drugs/alcohol within the last 12 months?: denies significant use of any alcohol or drugs; drinks a "couple of times/year", only take "what ive been given, I dont take any illegal drugs" If attempted suicide, did drugs/alcohol play a role in this?: No Alcohol/Substance Abuse Treatment Hx: Denies past history Has alcohol/substance abuse ever caused legal problems?: No  Social Support System:   [employer]ub[employer] CorporationPatient's Community Support System: Fair Describe  Community Support System: Attends church  and finds support in Sunday School class, chlidren involved in extra curricular activities, suspicious of daughter's school and feels she is not getting good enough education Type of faith/religion: [employer], [employer] How does patient's faith help to cope with current illness?: Says she and children will be baptized by her grandfather on Mothers Day  Leisure/Recreation:   Leisure and Hobbies: computers, tennis, writing "driving my chlidren around to activities"  Strengths/Needs:   What things does the patient do well?: "actions speak louder than words", observant, Publishing copy, computers In what areas does patient struggle / problems for patient: "my only problems are [name] and my mother"  Discharge Plan:   Does patient have access to transportation?: Yes (says she has a car in her name) Will patient be returning to same living situation after discharge?: Yes ("my name is on the lease", states she can return to apartment at discharge) Currently receiving community mental health services: Yes (From Whom) ([facility] (Bahrani MD and Emergency planning/management officer)) Does patient have financial barriers related to discharge medications?: No (has Medicaid)  Summary/Recommendations:    Patient is a 34 year old female, admitted involuntarily for treatment of bipolar disorder. Patient was difficult to assess, somewhat paranoid, tangential and illogical in her discussions. Spoke about "finding things out" on a computer, and taking these concerns to police.  Felt she was protecting her children in some way by doing this.  Says "I am thrilled that I found the computer and can now dismantle his groups."  Refers to something "he did to me online that was not true and got me crucified."  Saw "[name] being taken to the torture chamber."  Patient reportedly lives in apartment w her mother and two children (2 and 7).  Patient repeatedly questioned CSW about whether she was going to "take my children away" -  says that DSS has removed children from her in past.  Says that she worked "14 months" to get them back.  Was concerned about whereabouts of children at present, "maybe they are with their biological father."  Feels that current admission stems from "finding out some truth" about "[name]" and that both he and her mother are heading to jail for some kind of misdeed.  Patient currently receives care from [facility] and says "they say I am doing great and they are proud of me."  Has little insight into current condition.  Consents for CSW to speak only w father and specific people at [facility].  Wants to return to [facility] at discharge.  Patient will benefit from hospitalization to receive psychoeducation and group therapy services to increase coping skills for and understanding of bipolar disorder, milieu therapy, medications management, and nursing support.  Patient will develop appropriate coping skills for dealing w overwhelming emotions, stabilize on medications, and encourage acceptance of her current illness along w appropriate self care at discharge.  CSWs will develop discharge plan to include family support and referral to appropriate after care services, patient wants to return to current provider.  Patient is a smoker but says "I have already quit", declines Quitline referral.  Was not able to discuss discharge planning process in her current state of mind.  Santa Genera, LCSW Clinical Social Worker   Sallee Lange 05/27/2014

## 2014-05-27 NOTE — Tx Team (Signed)
Initial Interdisciplinary Treatment Plan   PATIENT STRESSORS: Marital or family conflict Medication change or noncompliance   PATIENT STRENGTHS: Capable of independent living General fund of knowledge   PROBLEM LIST: Problem List/Patient Goals Date to be addressed Date deferred Reason deferred Estimated date of resolution  "I dont need to be here and I want to knnow when I can leave"                                                       DISCHARGE CRITERIA:  Ability to meet basic life and health needs Improved stabilization in mood, thinking, and/or behavior Verbal commitment to aftercare and medication compliance  PRELIMINARY DISCHARGE PLAN: Attend aftercare/continuing care group Return to previous living arrangement  PATIENT/FAMIILY INVOLVEMENT: This treatment plan has been presented to and reviewed with the patient, [name], and/or family member, .  The patient and family have been given the opportunity to ask questions and make suggestions.  [name], [name] 05/27/2014, 1:52 AM

## 2014-05-27 NOTE — [facility] Suicide Risk Assessment (Signed)
[facility]   Nursing information obtained from:    Demographic factors:    Current Mental Status:    Loss Factors:    Historical Factors:    Risk Reduction Factors:    Total Time spent with patient: 30 minutes Principal Problem: Bipolar 1 disorder, mixed, moderate Diagnosis:   Patient Active Problem List   Diagnosis Date Noted  . Bipolar 1 disorder, mixed, moderate [F31.62] 05/27/2014  . 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 Sinusitis, acute [J01.90] 10/13/2013  . Overweight (BMI 25.0-29.9) [E66.3] 10/12/2013  . Abdominal pain, left lower quadrant [R10.32] 02/04/2013  . Yeast vaginitis [B37.3] 02/04/2013  . Other malaise and fatigue [R53.81, R53.83] 11/06/2012  . Low back pain [M54.5] 11/06/2012  . Menstrual cramp [N94.6] 03/24/2012  . Asthma [493] 06/28/2010  . Insomnia [G47.00] 06/28/2010  . ADHD [F90.9] 06/13/2007  . ANXIETY [F41.1] 09/19/2006  . DEPRESSION [F32.9] 09/19/2006     Continued Clinical Symptoms:  Alcohol Use Disorder Identification Test Final Score (AUDIT): 1 The "Alcohol Use Disorders Identification Test", Guidelines for Use in Primary Care, Second Edition.  World Science writerHealth Organization [facility](WHO). Score between 0-7:  no or low risk or alcohol related problems. Score between 8-15:  moderate risk of alcohol related problems. Score between 16-19:  high risk of alcohol related problems. Score 20 or above:  warrants further diagnostic evaluation for alcohol dependence and treatment.   CLINICAL FACTORS:   Alcohol/Substance Abuse/Dependencies Previous Psychiatric Diagnoses and Treatments   Musculoskeletal: Strength & Muscle Tone: within normal limits Gait & Station: normal Patient leans: N/A  Psychiatric Specialty Exam: Physical Exam  ROS  Blood pressure 102/66, pulse 79, temperature 98.6 F (37 C), temperature source Oral, resp. rate 18, height 5\' 5"  (1.651 m), weight 63.504 kg (140 lb).Body mass index is 23.3 kg/(m^2).           Please see H&P.                                                 COGNITIVE FEATURES THAT CONTRIBUTE TO RISK:  Closed-mindedness, Polarized thinking and Thought constriction (tunnel vision)    SUICIDE RISK:   Mild:  Suicidal ideation of limited frequency, intensity, duration, and specificity.  There are no identifiable plans, no associated intent, mild dysphoria and related symptoms, good self-control (both objective and subjective assessment), few other risk factors, and identifiable protective factors, including available and accessible social support.  PLAN OF CARE: Please see H&P.   Medical Decision Making:  Review of Psycho-Social Stressors (1), Review or order clinical lab tests (1), Established Problem, Worsening (2), New Problem, with no additional work-up planned (3), Review of Last Therapy Session (1), Review of Medication Regimen & Side Effects (2) and Review of New Medication or Change in Dosage (2)  I certify that inpatient services furnished can reasonably be expected to improve the patient's condition.   Lindee Leason MD 05/27/2014, 3:13 PM

## 2014-05-27 NOTE — Progress Notes (Signed)
Pt is a 34 year old female admitted with psychotic symptoms   She is delusional and thinks [name] is her mother and that her ex husband is in [employer]    Her husband had taken papers out on her because she wasn't feeding and bathing the children and was acting bizarre  She has a history of mental illness  And abuses perscrirtions and pot   She was disorganized and irritable during admission and was guarded   Was reluctant to allow a search   She kept saying  I dont know why I was brought here  i dont need to be here and i want to be discharged so i can go to my baptism on mothers day    Admission assessment completed and pt was oriented to the unit   She was given nourishment and shown to her room where she is resting quietly   She is on Q 15 min checks

## 2014-05-27 NOTE — Progress Notes (Signed)
D: Pt presents anxious this morning on approach. Pt appears to have limited insight this morning. Pt stated that she is here because she was feeling overwhelmed. Pt denies suicidal homicidal thoughts. Pt denies AVH. Pt denies feeling paranoid this morning. Pt appears guarded and forwards little information. A: medications administered a ordered per MD. Verbal support given. Pt encouraged to attend groups. 15  minute checks performed for safety. R: Pt safety maintained.

## 2014-05-27 NOTE — [facility] Group Notes (Signed)
[facility] Group Notes:  (Counselor/Nursing/MHT/Case Management/Adjunct)  05/27/2014 1:15PM  Type of Therapy:  Group Therapy  Participation Level:  Active  Participation Quality:  Appropriate  Affect:  Flat  Cognitive:  Oriented  Insight:  Improving  Engagement in Group:  Limited  Engagement in Therapy:  Limited  Modes of Intervention:  Discussion, Exploration and Socialization  Summary of Progress/Problems: The topic for group was balance in life.  Pt participated in the discussion about when their life was in balance and out of balance and how this feels.  Pt discussed ways to get back in balance and short term goals they can work on to get where they want to be. "I'm sitting here wasting tax payers money because I am OK."  Later shared that there is someone in her life named [name] who triggers her.  "I try to channel my emotions in a positive direction."  "Americans are ignorant in general."  Disorganized, labile mood.  Was able to sit still for a guided imagery exercise, but left soon after that.   [name], [name] 05/27/2014 1:19 PM

## 2014-05-27 NOTE — Progress Notes (Signed)
Pt c/o yeast infection, [name], thick, itchy discharge. MD made aware and pt given a one time order of Diflucan.

## 2014-05-27 NOTE — Progress Notes (Signed)
D: Pt in bed resting with eyes closed. Respirations even and unlabored. Pt appears to be in no signs of distress at this time. A: Q15min checks remains for this pt. R: Pt remains safe at this time.   
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
## 2014-05-27 NOTE — Progress Notes (Signed)
  Patient lacks capacity to participate in disposition planning at this time .     [name] ,MD [facility]      '

## 2014-05-27 NOTE — ED Notes (Signed)
Pt transported to [facility] by [employer] for continuation of specialized care. She left in no acute distress. Belongings signed for and given to [employer] officer. Pt left in no acute distress. 

## 2014-05-28 LAB — HEMOGLOBIN A1C
HEMOGLOBIN A1C: 5.7 % — AB (ref 4.8–5.6)
MEAN PLASMA GLUCOSE: 117 mg/dL

## 2014-05-28 MED ORDER — CLONAZEPAM 0.5 MG PO TABS
0.2500 mg | ORAL_TABLET | Freq: Two times a day (BID) | ORAL | Status: DC | PRN
  Administered 2014-05-28 – 2014-05-30 (×4): 0.25 mg via ORAL
  Filled 2014-05-28 (×4): qty 1

## 2014-05-28 MED ORDER — ZIPRASIDONE HCL 40 MG PO CAPS
40.0000 mg | ORAL_CAPSULE | Freq: Every day | ORAL | Status: DC
  Administered 2014-05-28 – 2014-05-30 (×3): 40 mg via ORAL
  Filled 2014-05-28 (×4): qty 1

## 2014-05-28 MED ORDER — ZIPRASIDONE HCL 20 MG PO CAPS
20.0000 mg | ORAL_CAPSULE | Freq: Every day | ORAL | Status: DC
  Administered 2014-05-29 – 2014-05-31 (×3): 20 mg via ORAL
  Filled 2014-05-28 (×4): qty 1

## 2014-05-28 NOTE — [facility] Suicide Risk Assessment (Addendum)
[facility] INPATIENT:  Family/Significant Other Suicide Prevention Education  Suicide Prevention Education:  Patient Refusal for Family/Significant Other Suicide Prevention Education: The patient [name] has refused to provide written consent for family/significant other to be provided Family/Significant Other Suicide Prevention Education during admission and/or prior to discharge.  Physician notified.  05/28/14: Patient only willing for CSW to contact her father for SPE, pt unable to provide number for father, number not in chart.    Sallee LangeCunningham, Anne C 05/28/2014, 8:31 AM

## 2014-05-28 NOTE — Progress Notes (Signed)
Adult Psychoeducational Group Note  Date:  05/28/2014 Time:  8:39 PM  Group Topic/Focus:  Wrap-Up Group:   The focus of this group is to help patients review their daily goal of treatment and discuss progress on daily workbooks.  Participation Level:  Active  Participation Quality:  Appropriate  Affect:  Appropriate  Cognitive:  Appropriate  Insight: Appropriate  Engagement in Group:  Engaged  Modes of Intervention:  Discussion  Additional Comments: The patient expressed people have  different views of trust.The patient also said that she learns something different in each group.  Octavio Mannshigpen, Lamoyne Hessel Lee 05/28/2014, 8:39 PM

## 2014-05-28 NOTE — Progress Notes (Signed)
[facility] MD Progress Note  05/28/2014 2:05 PM [name]  MRN:  [id number] Subjective:  Patient states " I am ok, I am not too anxious . I still have an apartment to go . I don't know where my children are . "  Objective: [name] is an 34 y.o.caucasian female who presented under IVC to [facility] via [employer]. Per initial notes in EHR - IVC taken out by Cherly Hensen (father of patient's children) [phone]. IVC reads: Respondent has been previously diagnosed with a mental health issue .Family relates that she has been prescribed medication for her issues but she is non-compliant with her medication regimen. She has also been previously committed, 2 years ago. Respondent has been acting very erratically lately, she believes that the father of her children is a terrorist with [employer] and that people are out to get her. She told her mother that she is taking the children away. She has done this before and she randomly took the children to [facility] where she knows no one and she ended up hospitalized there and children were taken away. She has threatened to kill children's father. She has not been providing for her children either. She does not bathe them or regularly feed them. Family relates that she abuses prescription medications and marijuana."   Patient seen this AM. Patient continues to be labile , delusional and irritable . Pt continues to need a lot of redirection. Pt continues to lack insight in to her illness. Pt states that she does not know "why she was admitted to the hospital.'  Patient also with hx of amphetamine abuse - discussed referral to substance abuse program. Pt also reports being on Klonopin for years , prescribed by her out patient provider , and that she gets in to withdrawal if not on it . Klonopin was started , with plan to taper off slowly. Provided medication education and the risk of being on BZD for long term. Discussed alternative treatment with  her.         Principal Problem: Bipolar 1 disorder, mixed, moderate Diagnosis:   Patient Active Problem List   Diagnosis Date Noted  . Bipolar 1 disorder, mixed, moderate [F31.62] 05/27/2014  . 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 Sinusitis, acute [J01.90] 10/13/2013  . Overweight (BMI 25.0-29.9) [E66.3] 10/12/2013  . Abdominal pain, left lower quadrant [R10.32] 02/04/2013  . Yeast vaginitis [B37.3] 02/04/2013  . Other malaise and fatigue [R53.81, R53.83] 11/06/2012  . Low back pain [M54.5] 11/06/2012  . Menstrual cramp [N94.6] 03/24/2012  . Asthma [493] 06/28/2010  . Insomnia [G47.00] 06/28/2010  . ADHD [F90.9] 06/13/2007  . ANXIETY [F41.1] 09/19/2006  . DEPRESSION [F32.9] 09/19/2006   Total Time spent with patient: 30 minutes   Past Medical History:  Past Medical History  Diagnosis Date  . Abnormal Pap smear   . Headache(784.0)   . Anemia   . Anxiety   . PPH (postpartum hemorrhage)   . Asthma   . Blood transfusion     Pt delivery in 2009  . Complication of anesthesia   . Spinal headache    History reviewed. No pertinent past surgical history. Family History:  Family History  Problem Relation Age of Onset  . Thrombophlebitis Mother   . Hypertension Father   . Heart disease Maternal Grandmother   . Heart disease Paternal Grandfather   . Hypertension Paternal Grandfather    Social History:  History  Alcohol Use No     History  Drug Use  .  Yes  . Special: Marijuana    History   Social History  . Marital Status: Single    Spouse Name: N/A  . Number of Children: N/A  . Years of Education: N/A   Social History Main Topics  . Smoking status: Former Games developer  . Smokeless tobacco: Never Used  . Alcohol Use: No  . Drug Use: Yes    Special: Marijuana  . Sexual Activity: Not Currently     Comment: Pt currently pregnant   Other Topics Concern  . None   Social History Narrative   Additional History:    Sleep: Fair  Appetite:   Fair    Musculoskeletal: Strength & Muscle Tone: within normal limits Gait & Station: normal Patient leans: N/A   Psychiatric Specialty Exam: Physical Exam  Review of Systems  Psychiatric/Behavioral: Positive for substance abuse. The patient is nervous/anxious.     Blood pressure 107/69, pulse 79, temperature 98.1 F (36.7 C), temperature source Oral, resp. rate 16, height 5\' 5"  (1.651 m), weight 63.504 kg (140 lb).Body mass index is 23.3 kg/(m^2).  General Appearance: Fairly Groomed  Patent attorney::  Fair  Speech:  Pressured  Volume:  Normal  Mood:  Anxious, Euphoric and Irritable  Affect:  Labile  Thought Process:  Disorganized  Orientation:  Full (Time, Place, and Person)  Thought Content:  Delusions, Paranoid Ideation and Rumination  Suicidal Thoughts:  No  Homicidal Thoughts:  No  Memory:  Immediate;   Fair Recent;   Fair Remote;   Fair  Judgement:  Impaired  Insight:  Lacking  Psychomotor Activity:  Restlessness  Concentration:  Poor  Recall:  [employer] of Knowledge:Fair  Language: Fair  Akathisia:  No  Handed:  Right  AIMS (if indicated):     Assets:  Physical Health Social Support  ADL's:  Intact  Cognition: WNL  Sleep:  Number of Hours: 5.75     Current Medications: Current Facility-Administered Medications  Medication Dose Route Frequency Provider Last Rate Last Dose  . acetaminophen (TYLENOL) tablet 650 mg  650 mg Oral Q6H PRN Kerry Hough, PA-C   650 mg at 05/27/14 1502  . alum & mag hydroxide-simeth (MAALOX/MYLANTA) 200-200-20 MG/5ML suspension 30 mL  30 mL Oral Q4H PRN Kerry Hough, PA-C      . carbamazepine (TEGRETOL XR) 12 hr tablet 100 mg  100 mg Oral BID Jomarie Longs, MD   100 mg at 05/28/14 0837  . clonazePAM (KLONOPIN) tablet 0.25 mg  0.25 mg Oral BID PRN Jomarie Longs, MD   0.25 mg at 05/28/14 1316  . magnesium hydroxide (MILK OF MAGNESIA) suspension 30 mL  30 mL Oral Daily PRN Kerry Hough, PA-C   30 mL at 05/28/14 0643  .  nicotine polacrilex (NICORETTE) gum 2 mg  2 mg Oral PRN Jomarie Longs, MD      . potassium chloride SA (K-DUR,KLOR-CON) CR tablet 20 mEq  20 mEq Oral BID Kerry Hough, PA-C   20 mEq at 05/28/14 0837  . risperiDONE (RISPERDAL M-TABS) disintegrating tablet 2 mg  2 mg Oral Q8H PRN Kerry Hough, PA-C       And  . ziprasidone (GEODON) injection 20 mg  20 mg Intramuscular PRN Kerry Hough, PA-C      . traZODone (DESYREL) tablet 50 mg  50 mg Oral QHS,MR X 1 Kerry Hough, PA-C   50 mg at 05/27/14 2142  . [START ON 05/29/2014] ziprasidone (GEODON) capsule 20 mg  20 mg Oral  Q breakfast Jomarie Longs, MD      . ziprasidone (GEODON) capsule 40 mg  40 mg Oral Q supper Jomarie Longs, MD        Lab Results:  Results for orders placed or performed during the hospital encounter of 05/27/14 (from the past 48 hour(s))  Hemoglobin A1c     Status: Abnormal   Collection Time: 05/27/14  6:40 AM  Result Value Ref Range   Hgb A1c MFr Bld 5.7 (H) 4.8 - 5.6 %    Comment: (NOTE)         Pre-diabetes: 5.7 - 6.4         Diabetes: >6.4         Glycemic control for adults with diabetes: <7.0    Mean Plasma Glucose 117 mg/dL    Comment: (NOTE) Performed At: [facility] [address] [place], [place] [id number] Mila Homer MD NW:[id number] Performed at [facility]   Lipid panel     Status: Abnormal   Collection Time: 05/27/14  6:40 AM  Result Value Ref Range   Cholesterol 130 0 - 200 mg/dL   Triglycerides 61 <578 mg/dL   HDL 30 (L) >46 mg/dL   Total CHOL/HDL Ratio 4.3 RATIO   VLDL 12 0 - 40 mg/dL   LDL Cholesterol 88 0 - 99 mg/dL    Comment:        Total Cholesterol/HDL:CHD Risk Coronary Heart Disease Risk Table                     Men   Women  1/2 Average Risk   3.4   3.3  Average Risk       5.0   4.4  2 X Average Risk   9.6   7.1  3 X Average Risk  23.4   11.0        Use the calculated Patient Ratio above and the CHD Risk Table to determine the  patient's CHD Risk.        ATP III CLASSIFICATION (LDL):  <100     mg/dL   Optimal  962-952  mg/dL   Near or Above                    Optimal  130-159  mg/dL   Borderline  841-324  mg/dL   High  >401     mg/dL   Very High Performed at [facility]   TSH     Status: None   Collection Time: 05/27/14  6:40 AM  Result Value Ref Range   TSH 0.941 0.350 - 4.500 uIU/mL    Comment: Performed at [facility]    Physical Findings: AIMS: Facial and Oral Movements Muscles of Facial Expression: None, normal Lips and Perioral Area: None, normal Jaw: None, normal Tongue: None, normal,Extremity Movements Upper (arms, wrists, hands, fingers): None, normal Lower (legs, knees, ankles, toes): None, normal, Trunk Movements Neck, shoulders, hips: None, normal, Overall Severity Severity of abnormal movements (highest score from questions above): None, normal Incapacitation due to abnormal movements: None, normal Patient's awareness of abnormal movements (rate only patient's report): No Awareness, Dental Status Current problems with teeth and/or dentures?: No Does patient usually wear dentures?: No  CIWA:    COWS:     Assessment: Patient is a 74 y old CF with hx of Bipolar disorder, adhd ,  as well as stimulant use disorder , presented very disorganized and psychotic . Will continue  treatment plan.    Treatment Plan Summary: Daily contact with patient to assess and evaluate symptoms and progress in treatment and Medication management Will increase Geodon to 20 mg po daily and 40 mg po with supper . Ordered EKG - weekend provider to review that once it is done. Will continue Tegretol XR 100 mg po bid . Will add prn medications for anxiety/agitation. Will continue Trazodone 50 mg po qhs for sleep. Reduce Klonopin to 0.25 mg po bid prn. Will continue to taper off . Will continue to monitor vitals ,medication compliance and treatment side effects while patient is here.   Will monitor for medical issues as well as call consult as needed.  Reviewed labs ,wnl. CSW will start working on disposition.  Patient to participate in therapeutic milieu .    Medical Decision Making:  Review of Psycho-Social Stressors (1), Review of Last Therapy Session (1), Review of Medication Regimen & Side Effects (2) and Review of New Medication or Change in Dosage (2)     Atzin Buchta md 05/28/2014, 2:05 PM

## 2014-05-28 NOTE — [facility] Group Notes (Signed)
[facility]   05/28/2014 11:42 AM  Participation Quality:  Agitated, distractable  Mood/Affect:  Excited and Labile  Depression Rating:  0  Anxiety Rating:  0 (denies Klonopin is an addiction, says she is prescribed by MD and MD says I dont abuse it)  Thoughts of Suicide:  No Will you contract for safety?   NA  Current AVH:  No  Plan for Discharge/Comments:  Says she can return to mother's house "lease is in my name"; however, ED notes state that mother may not be willing for her to return.  Will need to be clarified.  Gave SW permission to contact grandfather and will provide number.  Current w [facility].   Transportation Means: family or bus pass  Supports:  Father and grandfather are supports, patient frustrated by mother and Barbara CowerJason.  Unclear level of support in community, CSW will continue to assess  Sallee LangeCunningham, Jene Huq C

## 2014-05-28 NOTE — Tx Team (Signed)
Interdisciplinary Treatment Plan Update (Adult)  Date:  05/28/2014 Time Reviewed:  9:00 AM  Progress in Treatment: Attending groups: Yes. Participating in groups:  Yes., pressured speech, euphoric, difficult to redirect Taking medication as prescribed:  Yes. Tolerating medication:  Yes. Family/Significant othe contact made:  No, will contact:  patient allows contact w only father but provide contact number Patient understands diagnosis:  No. Limited insight, denies illness Discussing patient identified problems/goals with staff:  Yes. Medical problems stabilized or resolved:  Yes. Denies suicidal/homicidal ideation: Yes. Issues/concerns per patient self-inventory:  No. Other:  New problem(s) identified: Yes, Describe:  current CPS investigation re improper care of children.  Patient does not know where children are.  Per record, father (Joshus) has children in his care.  Discharge Plan or Barriers:  Patient says that "my name is on the lease, I can go back there" However, ED note states that mother may not be willing to have patient return at discharge, will need to be clarified.    Reason for Continuation of Hospitalization: Mania Medication stabilization  Comments:  [facility], petitioner states that she has displayed increasingly erratic behavior, alleged noncompliance w medication treatment regimen, failure to provide proper care and supervision to minor children, believing children's father is an ISIS terrorist, suspicion of mother.  Patient has previous history of taking children away and not letting family know where she and children were.  Is currently under care of [facility] and sees MD and therapist.  Patient displays paranoia, suspicion, bizarre ideas, mood lability.    Estimated length of stay: 5 - 7 days  New goal(s):   Medication stabilization, greater ability to self manage current illness, greater insight into how her choices and behaviors may  be result of mental illness   Review of initial/current patient goals per problem list:   Please see initial plan of care  Attendees: Patient:   4/29/20169:00 AM  Family:   4/29/20169:00 AM  Physician:  Jomarie LongsSaramma Eappen MD 4/29/20169:00 AM  Nursing:   Kemper DurieEdie RN, Marian RN 4/29/20169:00 AM  Case Manager:   4/29/20169:00 AM  Counselor:  Santa GeneraAnne Eythan Jayne, LCSW; [place] LCSW 4/29/20169:00 AM  Other:  Onnie BoerJennifer Clark, RN, UR 4/29/20169:00 AM  Other:  Delight StareVal Enoch, [place] TCT 4/29/20169:00 AM  Other:   4/29/20169:00 AM  Other:  4/29/20169:00 AM  Other:  4/29/20169:00 AM  Other:  4/29/20169:00 AM  Other:  4/29/20169:00 AM  Other:  4/29/20169:00 AM  Other:  4/29/20169:00 AM  Other:   4/29/20169:00 AM   Scribe for Treatment Team:   Sallee Langeunningham, Jaasiel Hollyfield C, 05/28/2014, 9:00 AM

## 2014-05-28 NOTE — Clinical Social Work Note (Signed)
CSW spoke w grandfather, [name] ([phone]), for contact number for father, [name].  Grandfather was unaware that patient was in hospital.  CSW will encourage patient to contact grandfather.  [phone] is contact information for father, [name].  Grandparents live in [place] and do not have much contact w patient, grandfather has not seen patient for several years. Patient now calling grandfather for past couple of months.     Santa GeneraAnne Cunningham, LCSW Clinical Social Worker

## 2014-05-28 NOTE — Tx Team (Signed)
  Interdisciplinary Treatment Plan Update   Date Reviewed:  05/28/2014  Time Reviewed:  8:14 AM  Progress in Treatment:   Attending groups: Yes Participating in groups: Yes Taking medication as prescribed: Yes  Tolerating medication: Yes Family/Significant other contact made: Yes  Patient understands diagnosis: No  Limited insight  Discussing patient identified problems/goals with staff: Yes  See initial care plan Medical problems stabilized or resolved: Yes Denies suicidal/homicidal ideation: Yes  In tx team Patient has not harmed self or others: Yes  For review of initial/current patient goals, please see plan of care.  Estimated Length of Stay:  4-5 days  Reason for Continuation of Hospitalization: Mania Medication stabilization  New Problems/Goals identified:  N/A  Discharge Plan or Barriers:   return home, follow up outpt  Additional Comments:  :[name] is an 34 y.o.caucasian female who [facility]. Per initial notes in EHR - IVC taken out by [name] (father of patient's children) [phone]. IVC reads: Respondent has been previously diagnosed with a mental health issue but petitioner is not sure of actual diagnosis. Family relates that she has been prescribed medication for her issues but she is non-compliant with her medication regimen. She has also been previously committed, 2 years ago. Respondent has been acting very erratically lately, she believes that the father of her children is a terrorist with [employer] and that people are out to get her. She told her mother that she is taking the children away. She has done this before and [place] she [place] no one and she ended up hospitalized there and children were taken away. She has threatened to kill children's father. She has not been providing for her children either. She does not bathe them or regularly feed them. Family relates that she abuses  prescription medications and marijuana."  Tegretol, Geodon trial  Attendees:  Signature: Ivin BootySarama Eappen, MD 05/28/2014 8:14 AM   Signature: Richelle Itood Katharyn Schauer, LCSW 05/28/2014 8:14 AM  Signature: Fransisca KaufmannLaura Davis, NP 05/28/2014 8:14 AM  Signature: Joslyn Devonaroline Beaudry, RN 05/28/2014 8:14 AM  Signature: Liborio NixonPatrice White, RN 05/28/2014 8:14 AM  Signature:  05/28/2014 8:14 AM  Signature:   05/28/2014 8:14 AM  Signature:    Signature:    Signature:    Signature:    Signature:    Signature:      Scribe for Treatment Team:   Richelle Itood Corrin Sieling, LCSW  05/28/2014 8:14 AM

## 2014-05-28 NOTE — Progress Notes (Signed)
D:  Pt up interacting in day room.  Appearing happy and overactive in day room.  Denies HI, SI, and AVH.  States "she has nothing wrong with her and dont know why I'm here".   Denies pain or discomfort.  Attended groups on unit.  A:  Medicated pt. Per orders.  Assured pt of safety on unit, gave emotional support.  R:  Pt. Remains safe on unit.

## 2014-05-28 NOTE — Progress Notes (Signed)
Patient appeared bright on approach. She is visible on the unit and interacting well. She endorsed a good day and reported that she is feeling better. Although she said that she did not appreciate the way her medications are been switched around. She denied SI/HI and denied hallucinations. Writer encouraged and supported patient. Q 15 minute check continues as ordered to maintain safety.

## 2014-05-28 NOTE — B[facility] Group Notes (Signed)
[facility] LCSW Group Therapy  05/28/2014  1:05 PM  Type of Therapy:  Group therapy  Participation Level:  Active  Participation Quality:  Attentive  Affect:  Flat  Cognitive:  Oriented  Insight:  Limited  Engagement in Therapy:  Limited  Modes of Intervention:  Discussion, Socialization  Summary of Progress/Problems:  Chaplain was here to lead a group on themes of hope and courage.  In and out of group several times, but generally able to stay focused during her time with us.  Today states that she knows she does best "when I don't let others get the best of me."  She explained that she needs to learn to better control her emotions and reactions to others "so I don't give them the satisfaction."  Better insight than yesterday.  [name], Mane Consolo B 05/28/2014 11:40 AM

## 2014-05-29 ENCOUNTER — Encounter (HOSPITAL_COMMUNITY): Payer: Self-pay | Admitting: Registered Nurse

## 2014-05-29 LAB — BASIC METABOLIC PANEL
ANION GAP: 9 (ref 5–15)
BUN: 12 mg/dL (ref 6–23)
CHLORIDE: 104 mmol/L (ref 96–112)
CO2: 28 mmol/L (ref 19–32)
Calcium: 9 mg/dL (ref 8.4–10.5)
Creatinine, Ser: 0.73 mg/dL (ref 0.50–1.10)
GFR calc non Af Amer: >90 mL/min (ref >90)
Glucose, Bld: 95 mg/dL (ref 70–99)
Potassium: 3.8 mmol/L (ref 3.5–5.1)
Sodium: 141 mmol/L (ref 135–145)

## 2014-05-29 NOTE — B[facility] Group Notes (Signed)
[facility] Group Notes:  (Nursing/MHT/Case Management/Adjunct)  Date:  05/29/2014  Time:  11:40 AM  Type of Therapy:  Psychoeducational Skills  Participation Level:  Active  Participation Quality:  Appropriate  Affect:  Appropriate  Cognitive:  Appropriate  Insight:  Appropriate  Engagement in Group:  Engaged  Modes of Intervention:  Discussion  Summary of Progress/Problems: Pt did attend self inventory group.  Santiago Graf Shanta 05/29/2014, 11:40 AM 

## 2014-05-29 NOTE — BHH Group Notes (Signed)
[facility] Group Notes:  (Clinical Social Work)  05/29/2014  11:15-12:00PM  Summary of Progress/Problems:   The main focus of today's process group was to discuss patients' feelings related to being hospitalized, as well as the difference between "being" and "having" a mental health diagnosis.  It was agreed in general by the group that it would be preferable to avoid future hospitalizations, and we discussed means of doing that.  As a follow-up, problems with adhering to medication recommendations were discussed.  The patient expressed their primary feeling about being hospitalized is that she is "blessed to be with great people" but she needs to be discharged to go live her life, raise her children, get to the church functions she is missing.  She feels the security in the facility is not adequate.  She stated that there is a lot of redundancy in her hospitalizations, that it happens every year, so she knows what to expect.  She talked about not liking to be labeled, not liking that her anxiety med is not being given to her, not liking that meds are being changed when she feels her outside psychiatrist was dosing her correctly.  She had her hand raised almost constantly to speak in group, was pleasant and vaguely on topic but tended toward perseveration about getting out of the hospital.  Type of Therapy:  Group Therapy - Process  Participation Level:  Active  Participation Quality:  Attentive, Monopolizing, Redirectable and Sharing  Affect:  Blunted  Cognitive:  Alert  Insight:  Developing/Improving  Engagement in Therapy:  Engaged  Modes of Intervention:  Exploration, Discussion  Ambrose MantleMareida Grossman-Orr, LCSW 05/29/2014, 12:25 PM

## 2014-05-29 NOTE — Progress Notes (Signed)
Patient ID: [name], female   DOB: 08-Oct-1980, 34 y.o.   MRN: [id number]   D: Pt has been very flat and depressed on the unit today. Pt stayed in the bed most of the day, when patient did get up she requested Klonopin for anxiety. Pt reported that her depression was a 0, her hopelessness was a 0, and her anxiety was a 0. Pt reported that her goal for today was to be happy and healthy. Pt reported being negative SI/HI, no AH/VH noted. A: 15 min checks continued for patient safety. R: Pt safety maintained.

## 2014-05-29 NOTE — Progress Notes (Signed)
Pt did not attend wrap up group this evening.  

## 2014-05-29 NOTE — Progress Notes (Signed)
Patient ID: [name], female   DOB: Mar 26, 1980, 34 y.o.   MRN: [id number] [facility] MD Progress Note  05/29/2014 1:41 PM [name]  MRN:  [id number]    Subjective:  Patient states "I'm doing well.  I'm eating fine, sleeping fine, and my medication is fine.  Will I be able to get out by next week because I have a baptism thing I have to attend."       Objective: Patient seen chart reviewed.  Discussed with treatment team.  Patient tolerating medications without adverse reactions and is participating in group sessions.  Patient states that she is feeling better and has concerns about getting out in time for court and a baptisin.      Principal Problem: Bipolar 1 disorder, mixed, moderate Diagnosis:   Patient Active Problem List   Diagnosis Date Noted  . Bipolar 1 disorder, mixed, moderate [F31.62] 05/27/2014  . 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 Sinusitis, acute [J01.90] 10/13/2013  . Overweight (BMI 25.0-29.9) [E66.3] 10/12/2013  . Abdominal pain, left lower quadrant [R10.32] 02/04/2013  . Yeast vaginitis [B37.3] 02/04/2013  . Other malaise and fatigue [R53.81, R53.83] 11/06/2012  . Low back pain [M54.5] 11/06/2012  . Menstrual cramp [N94.6] 03/24/2012  . Asthma [493] 06/28/2010  . Insomnia [G47.00] 06/28/2010  . ADHD [F90.9] 06/13/2007  . ANXIETY [F41.1] 09/19/2006  . DEPRESSION [F32.9] 09/19/2006   Total Time spent with patient: 30 minutes   Past Medical History:  Past Medical History  Diagnosis Date  . Abnormal Pap smear   . Headache(784.0)   . Anemia   . Anxiety   . PPH (postpartum hemorrhage)   . Asthma   . Blood transfusion     Pt delivery in 2009  . Complication of anesthesia   . Spinal headache    History reviewed. No pertinent past surgical history. Family History:  Family History  Problem Relation Age of Onset  . Thrombophlebitis Mother   . Hypertension Father   . Heart disease Maternal Grandmother   . Heart disease Paternal  Grandfather   . Hypertension Paternal Grandfather    Social History:  History  Alcohol Use No     History  Drug Use  . Yes  . Special: Marijuana    History   Social History  . Marital Status: Single    Spouse Name: N/A  . Number of Children: N/A  . Years of Education: N/A   Social History Main Topics  . Smoking status: Former Games developer  . Smokeless tobacco: Never Used  . Alcohol Use: No  . Drug Use: Yes    Special: Marijuana  . Sexual Activity: Not Currently     Comment: Pt currently pregnant   Other Topics Concern  . None   Social History Narrative   Additional History:    Sleep: Fair, Improving  Appetite:  Fair, Improving    Musculoskeletal: Strength & Muscle Tone: within normal limits Gait & Station: normal Patient leans: N/A   Psychiatric Specialty Exam: Physical Exam  Constitutional: She is oriented to person, place, and time.  Neck: Normal range of motion.  Respiratory: Effort normal and breath sounds normal.  Musculoskeletal: Normal range of motion.  Neurological: She is alert and oriented to person, place, and time.  Skin: Skin is warm and dry.  Psychiatric: Her mood appears anxious. Her speech is rapid and/or pressured. She expresses no suicidal ideation.    Review of Systems  Psychiatric/Behavioral: Positive for substance abuse. Depression: Denies. Suicidal ideas: Denies.  Hallucinations: Denies. The patient is nervous/anxious and has insomnia.     Blood pressure 98/66, pulse 69, temperature 98 F (36.7 C), temperature source Oral, resp. rate 16, height  (1.651 m), weight 63.504 kg (140 lb).Body mass index is 23.3 kg/(m^2).  General Appearance: Fairly Groomed  Patent attorney::  Fair  Speech:  Pressured  Volume:  Normal  Mood:  Anxious, Euphoric and Irritable  Affect:  Labile  Thought Process:  Disorganized  Orientation:  Full (Time, Place, and Person)  Thought Content:  Delusions, Paranoid Ideation and Rumination  Suicidal Thoughts:  No   Homicidal Thoughts:  No  Memory:  Immediate;   Fair Recent;   Fair Remote;   Fair  Judgement:  Impaired  Insight:  Lacking  Psychomotor Activity:  Restlessness  Concentration:  Poor  Recall:  [employer] of Knowledge:Fair  Language: Fair  Akathisia:  No  Handed:  Right  AIMS (if indicated):     Assets:  Physical Health Social Support  ADL's:  Intact  Cognition: WNL  Sleep:  Number of Hours: 6.25     Current Medications: Current Facility-Administered Medications  Medication Dose Route Frequency Provider Last Rate Last Dose  . acetaminophen (TYLENOL) tablet 650 mg  650 mg Oral Q6H PRN Kerry Hough, PA-C   650 mg at 05/27/14 1502  . alum & mag hydroxide-simeth (MAALOX/MYLANTA) 200-200-20 MG/5ML suspension 30 mL  30 mL Oral Q4H PRN Kerry Hough, PA-C      . carbamazepine (TEGRETOL XR) 12 hr tablet 100 mg  100 mg Oral BID Jomarie Longs, MD   100 mg at 05/29/14 0758  . clonazePAM (KLONOPIN) tablet 0.25 mg  0.25 mg Oral BID PRN Jomarie Longs, MD   0.25 mg at 05/29/14 1201  . magnesium hydroxide (MILK OF MAGNESIA) suspension 30 mL  30 mL Oral Daily PRN Kerry Hough, PA-C   30 mL at 05/28/14 0643  . nicotine polacrilex (NICORETTE) gum 2 mg  2 mg Oral PRN Jomarie Longs, MD      . risperiDONE (RISPERDAL M-TABS) disintegrating tablet 2 mg  2 mg Oral Q8H PRN Kerry Hough, PA-C       And  . ziprasidone (GEODON) injection 20 mg  20 mg Intramuscular PRN Kerry Hough, PA-C      . traZODone (DESYREL) tablet 50 mg  50 mg Oral QHS,MR X 1 Kerry Hough, PA-C   50 mg at 05/28/14 2118  . ziprasidone (GEODON) capsule 20 mg  20 mg Oral Q breakfast Jomarie Longs, MD   20 mg at 05/29/14 0758  . ziprasidone (GEODON) capsule 40 mg  40 mg Oral Q supper Jomarie Longs, MD   40 mg at 05/28/14 1729    Lab Results:  No results found for this or any previous visit (from the past 48 hour(s)).  Physical Findings: AIMS: Facial and Oral Movements Muscles of Facial Expression: None,  normal Lips and Perioral Area: None, normal Jaw: None, normal Tongue: None, normal,Extremity Movements Upper (arms, wrists, hands, fingers): None, normal Lower (legs, knees, ankles, toes): None, normal, Trunk Movements Neck, shoulders, hips: None, normal, Overall Severity Severity of abnormal movements (highest score from questions above): None, normal Incapacitation due to abnormal movements: None, normal Patient's awareness of abnormal movements (rate only patient's report): No Awareness, Dental Status Current problems with teeth and/or dentures?: No Does patient usually wear dentures?: No  CIWA:    COWS:     Assessment: Patient is a 71 y old CF with  hx of Bipolar disorder, adhd ,  as well as stimulant use disorder , presented very disorganized and psychotic . Will continue treatment plan.    Treatment Plan Summary: Daily contact with patient to assess and evaluate symptoms and progress in treatment and Medication management Will increase Geodon to 20 mg po daily and 40 mg po with supper . Ordered EKG - weekend provider to review that once it is done. Will continue Tegretol XR 100 mg po bid . Will add prn medications for anxiety/agitation. Will continue Trazodone 50 mg po qhs for sleep. Reduce Klonopin to 0.25 mg po bid prn. Will continue to taper off . Will continue to monitor vitals ,medication compliance and treatment side effects while patient is here.  Will monitor for medical issues as well as call consult as needed.  Reviewed labs ,wnl. CSW will start working on disposition.  Patient to participate in therapeutic milieu .   Will continue with current treatment plan; no changes at this time  Medical Decision Making:  Review of Psycho-Social Stressors (1), Review or order clinical lab tests (1), Review of Last Therapy Session (1) and Review of Medication Regimen & Side Effects (2)   Rankin, Shuvon, FNP-BC 05/29/2014, 1:41 PM  Reviewed the information documented and  agree with the treatment plan.  Alexiah Koroma,JANARDHAHA R. 05/29/2014 4:35 PM

## 2014-05-30 NOTE — [facility] Group Notes (Signed)
[facility] Group Notes:  (Nursing/MHT/Case Management/Adjunct)  Date:  05/30/2014  Time:  3:00 PM  Type of Therapy:  Psychoeducational Skills  Participation Level:  Active  Participation Quality:  Appropriate  Affect:  Appropriate  Cognitive:  Appropriate  Insight:  Appropriate  Engagement in Group:  Engaged  Modes of Intervention:  Discussion  Summary of Progress/Problems: Pt did attend self inventory group.   Mariel Lukins Shanta 05/30/2014, 3:00 PM 

## 2014-05-30 NOTE — [facility] Group Notes (Signed)
[facility] Group Notes:  (Clinical Social Work)  05/30/2014  [facility] Group Notes:  (Clinical Social Work)  05/30/2014  11:00AM-12:00PM  Summary of Progress/Problems:  The main focus of today's process group was to listen to a variety of genres of music and to identify that different types of music provoke different responses.  The patient then was able to identify personally what was soothing for them, as well as energizing.  Handouts were used to record feelings evoked, as well as how patient can personally use this knowledge in sleep habits, with depression, and with other symptoms.  The patient expressed understanding of concepts, as well as knowledge of how each type of music affected him/her and how this can be used at home as a wellness/recovery tool.  She talked constantly throughout group with another patient, could not be redirected to stop.  She was pleasant, smiling, even excited throughout the music selections, dancing in her chair to many songs.  Type of Therapy:  Music Therapy   Participation Level:  Active  Participation Quality:  Attentive and Sharing, Intrusive  Affect:  Blunted, Excited  Cognitive:  Disorganized  Insight:  Improving  Engagement in Therapy:  Engaged  Modes of Intervention:   Activity, Exploration  Ambrose MantleMareida Grossman-Orr, LCSW 05/30/2014

## 2014-05-30 NOTE — Progress Notes (Signed)
Patient ID: [name], female   DOB: 09/03/1980, 34 y.o.   MRN: [id number] [facility] MD Progress Note  05/30/2014 10:22 AM [name]  MRN:  [id number]    Subjective:  Patient states "Everything is going well.  My meds are working; their not making me feel groggy or sleepy.  I'm sleeping good; I'm not having to use Trazodone as much to help me go to sleep.  I'm eating good. I am a little anxious and I don't really agree with them taking me off of my anxiety medicine but it's okay."     Objective: Patient seen chart reviewed.  Discussed with treatment team.  Patient tolerating medications without adverse reactions and is participating in group sessions.  At this time patient denies suicidal/homicidal ideation, psychosis, and paranoia.     Principal Problem: Bipolar 1 disorder, mixed, moderate Diagnosis:   Patient Active Problem List   Diagnosis Date Noted  . Bipolar 1 disorder, mixed, moderate [F31.62] 05/27/2014  . 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 Sinusitis, acute [J01.90] 10/13/2013  . Overweight (BMI 25.0-29.9) [E66.3] 10/12/2013  . Abdominal pain, left lower quadrant [R10.32] 02/04/2013  . Yeast vaginitis [B37.3] 02/04/2013  . Other malaise and fatigue [R53.81, R53.83] 11/06/2012  . Low back pain [M54.5] 11/06/2012  . Menstrual cramp [N94.6] 03/24/2012  . Asthma [493] 06/28/2010  . Insomnia [G47.00] 06/28/2010  . ADHD [F90.9] 06/13/2007  . ANXIETY [F41.1] 09/19/2006  . DEPRESSION [F32.9] 09/19/2006   Total Time spent with patient: 20 minutes   Past Medical History:  Past Medical History  Diagnosis Date  . Abnormal Pap smear   . Headache(784.0)   . Anemia   . Anxiety   . PPH (postpartum hemorrhage)   . Asthma   . Blood transfusion     Pt delivery in 2009  . Complication of anesthesia   . Spinal headache    History reviewed. No pertinent past surgical history. Family History:  Family History  Problem Relation Age of Onset  . Thrombophlebitis Mother    . Hypertension Father   . Heart disease Maternal Grandmother   . Heart disease Paternal Grandfather   . Hypertension Paternal Grandfather    Social History:  History  Alcohol Use No     History  Drug Use  . Yes  . Special: Marijuana    History   Social History  . Marital Status: Single    Spouse Name: N/A  . Number of Children: N/A  . Years of Education: N/A   Social History Main Topics  . Smoking status: Former Research scientist (life sciences)  . Smokeless tobacco: Never Used  . Alcohol Use: No  . Drug Use: Yes    Special: Marijuana  . Sexual Activity: Not Currently     Comment: Pt currently pregnant   Other Topics Concern  . None   Social History Narrative   Additional History:    Sleep: Good  Patient states that she is not having to use medication as much to help her sleep  Appetite:  Good    Musculoskeletal: Strength & Muscle Tone: within normal limits Gait & Station: normal Patient leans: N/A   Psychiatric Specialty Exam: Physical Exam  Constitutional: She is oriented to person, place, and time.  Neck: Normal range of motion.  Respiratory: Effort normal and breath sounds normal.  Musculoskeletal: Normal range of motion.  Neurological: She is alert and oriented to person, place, and time.  Skin: Skin is warm and dry.  Psychiatric: Her mood appears anxious.  Her speech is rapid and/or pressured. She expresses no suicidal ideation.    Review of Systems  Psychiatric/Behavioral: Positive for depression. Suicidal ideas: Denies. Hallucinations: Denies. The patient is nervous/anxious and has insomnia.     Blood pressure 108/92, pulse 84, temperature 97.7 F (36.5 C), temperature source Oral, resp. rate 18, height 5' 5" (1.651 m), weight 63.504 kg (140 lb).Body mass index is 23.3 kg/(m^2).  General Appearance: Casual and Fairly Groomed  Engineer, water::  Fair  Speech:  Clear and Coherent and Normal Rate  Volume:  Normal  Mood:  Anxious  Affect:  Congruent  Thought Process:   Circumstantial and Goal Directed  Orientation:  Full (Time, Place, and Person)  Thought Content:  Delusions, Paranoid Ideation and Rumination Patient denies at this time  Suicidal Thoughts:  No  Homicidal Thoughts:  No  Memory:  Immediate;   Fair Recent;   Fair Remote;   Fair  Judgement:  Impaired  Insight:  Fair  Psychomotor Activity:  Restlessness  Concentration:  Fair  Recall:  [employer] of Knowledge:Fair  Language: Fair  Akathisia:  No  Handed:  Right  AIMS (if indicated):     Assets:  Physical Health Social Support  ADL's:  Intact  Cognition: WNL  Sleep:  Number of Hours: 6.5     Current Medications: Current Facility-Administered Medications  Medication Dose Route Frequency Provider Last Rate Last Dose  . acetaminophen (TYLENOL) tablet 650 mg  650 mg Oral Q6H PRN Laverle Hobby, PA-C   650 mg at 05/27/14 1502  . alum & mag hydroxide-simeth (MAALOX/MYLANTA) 200-200-20 MG/5ML suspension 30 mL  30 mL Oral Q4H PRN Laverle Hobby, PA-C      . carbamazepine (TEGRETOL XR) 12 hr tablet 100 mg  100 mg Oral BID Ursula Alert, MD   100 mg at 05/30/14 0749  . clonazePAM (KLONOPIN) tablet 0.25 mg  0.25 mg Oral BID PRN Ursula Alert, MD   0.25 mg at 05/29/14 1638  . magnesium hydroxide (MILK OF MAGNESIA) suspension 30 mL  30 mL Oral Daily PRN Laverle Hobby, PA-C   30 mL at 05/28/14 0643  . nicotine polacrilex (NICORETTE) gum 2 mg  2 mg Oral PRN Ursula Alert, MD   2 mg at 05/30/14 0819  . risperiDONE (RISPERDAL M-TABS) disintegrating tablet 2 mg  2 mg Oral Q8H PRN Laverle Hobby, PA-C       And  . ziprasidone (GEODON) injection 20 mg  20 mg Intramuscular PRN Laverle Hobby, PA-C      . traZODone (DESYREL) tablet 50 mg  50 mg Oral QHS,MR X 1 Laverle Hobby, PA-C   50 mg at 05/29/14 2122  . ziprasidone (GEODON) capsule 20 mg  20 mg Oral Q breakfast Ursula Alert, MD   20 mg at 05/30/14 0749  . ziprasidone (GEODON) capsule 40 mg  40 mg Oral Q supper Ursula Alert, MD   40 mg  at 05/29/14 1638    Lab Results:  Results for orders placed or performed during the hospital encounter of 05/27/14 (from the past 48 hour(s))  Basic metabolic panel     Status: None   Collection Time: 05/29/14  7:25 PM  Result Value Ref Range   Sodium 141 135 - 145 mmol/L   Potassium 3.8 3.5 - 5.1 mmol/L   Chloride 104 96 - 112 mmol/L   CO2 28 19 - 32 mmol/L   Glucose, Bld 95 70 - 99 mg/dL   BUN 12 6 -  23 mg/dL   Creatinine, Ser 0.73 0.50 - 1.10 mg/dL   Calcium 9.0 8.4 - 10.5 mg/dL   GFR calc non Af Amer >90 >90 mL/min   GFR calc Af Amer >90 >90 mL/min    Comment: (NOTE) The eGFR has been calculated using the CKD EPI equation. This calculation has not been validated in all clinical situations. eGFR's persistently <90 mL/min signify possible Chronic Kidney Disease.    Anion gap 9 5 - 15    Comment: Performed at [facility]    Physical Findings: AIMS: Facial and Oral Movements Muscles of Facial Expression: None, normal Lips and Perioral Area: None, normal Jaw: None, normal Tongue: None, normal,Extremity Movements Upper (arms, wrists, hands, fingers): None, normal Lower (legs, knees, ankles, toes): None, normal, Trunk Movements Neck, shoulders, hips: None, normal, Overall Severity Severity of abnormal movements (highest score from questions above): None, normal Incapacitation due to abnormal movements: None, normal Patient's awareness of abnormal movements (rate only patient's report): No Awareness, Dental Status Current problems with teeth and/or dentures?: No Does patient usually wear dentures?: No  CIWA:    COWS:     Assessment: Patient is a 19 y old CF with hx of Bipolar disorder, adhd ,  as well as stimulant use disorder , presented very disorganized and psychotic . Will continue treatment plan.    Treatment Plan Summary: Daily contact with patient to assess and evaluate symptoms and progress in treatment and Medication management Will increase  Geodon to 20 mg po daily and 40 mg po with supper . Ordered EKG - weekend provider to review that once it is done. Will continue Tegretol XR 100 mg po bid . Will add prn medications for anxiety/agitation. Will continue Trazodone 50 mg po qhs for sleep. Reduce Klonopin to 0.25 mg po bid prn. Will continue to taper off . Will continue to monitor vitals ,medication compliance and treatment side effects while patient is here.  Will monitor for medical issues as well as call consult as needed.  Reviewed labs ,wnl. CSW will start working on disposition.  Patient to participate in therapeutic milieu .   Will continue with current treatment plan with no changes at this time  Medical Decision Making:  Established Problem, Stable/Improving (1), Review of Psycho-Social Stressors (1), Review of Last Therapy Session (1) and Review of Medication Regimen & Side Effects (2)   Rankin, Shuvon, FNP-BC 05/30/2014, 10:22 AM  Reviewed the information documented and agree with the treatment plan.  Aubrey Blackard,JANARDHAHA R. 05/30/2014 2:27 PM

## 2014-05-30 NOTE — Progress Notes (Signed)
[facility] Group Notes:  (Nursing/MHT/Case Management/Adjunct)  Date:  05/30/2014  Time:  8:48 PM  Type of Therapy:  Psychoeducational Skills  Participation Level:  Active  Participation Quality:  Attentive  Affect:  Blunted  Cognitive:  Appropriate  Insight:  Improving  Engagement in Group:  Developing/Improving  Modes of Intervention:  Education  Summary of Progress/Problems: The patient verbalized in group this evening that she had a good day and that she attended groups and napped as well. In terms of the theme for the day, her coping skill is to write and pray.  Hazle CocaGOODMAN, Jobie Popp S 05/30/2014, 8:48 PM

## 2014-05-30 NOTE — Progress Notes (Signed)
Patient ID: [name], female   DOB: 31-Jan-1980, 34 y.o.   MRN: [id number]   D: Pt has been very manic on the unit today, pt has also had very poor boundries when it comes to the female patients on the unit. Pt was seen trying to get a female patient to come to her room, patient was redirected. Pt attended groups and attempted to engage in treatment. Pt reported that her goal for today was to feel good. Pt reported her depression as a 0, her hopelessness as a 0, and her anxiety as a 0. Pt reported being negative SI/HI, no AH/VH noted. A: 15 min checks continued for patient safety. R: Pt safety maintained.

## 2014-05-31 MED ORDER — TRAZODONE HCL 50 MG PO TABS: 50.0000 mg | ORAL_TABLET | Freq: Every evening | ORAL | Status: DC | PRN

## 2014-05-31 MED ORDER — IRON 325 (65 FE) MG PO TABS: 1.0000 | ORAL_TABLET | Freq: Every day | ORAL | Status: DC

## 2014-05-31 MED ORDER — CARBAMAZEPINE ER 100 MG PO TB12: 100.0000 mg | ORAL_TABLET | Freq: Two times a day (BID) | ORAL | Status: DC

## 2014-05-31 MED ORDER — ZIPRASIDONE HCL 40 MG PO CAPS: 40.0000 mg | ORAL_CAPSULE | Freq: Every day | ORAL | Status: DC

## 2014-05-31 MED ORDER — CLONAZEPAM 0.5 MG PO TABS: 0.2500 mg | ORAL_TABLET | Freq: Two times a day (BID) | ORAL | Status: DC | PRN

## 2014-05-31 MED ORDER — ZIPRASIDONE HCL 20 MG PO CAPS: 20.0000 mg | ORAL_CAPSULE | Freq: Every day | ORAL | Status: DC

## 2014-05-31 MED ORDER — CETIRIZINE HCL 10 MG PO TABS: 10.0000 mg | ORAL_TABLET | Freq: Every day | ORAL | Status: DC | PRN

## 2014-05-31 MED ORDER — FLUTICASONE PROPIONATE 50 MCG/ACT NA SUSP: 2.0000 | Freq: Every day | NASAL | Status: DC | PRN

## 2014-05-31 NOTE — Progress Notes (Signed)
  [facility] :  Will you be returning to the same living situation after discharge:  Yes,  home At discharge, do you have transportation home?: Yes,  bus pass Do you have the ability to pay for your medications: Yes,  MCD  Release of information consent forms completed and in the chart;  Patient's signature needed at discharge.  Patient to Follow up at: Follow-up Information    Follow up with [facility]. Go on 06/02/2014.   Why:  Wednesday at noon with Aggie Cosierrystal and MD Clyde CanterburyBahrani.   Contact information:   [address],  [place], [place] [address] [phone] Fax:  [phone]      Patient denies SI/HI: Yes,  yes    Safety Planning and Suicide Prevention discussed: Yes,  yes  Have you used any form of tobacco in the last 30 days? (Cigarettes, Smokeless Tobacco, Cigars, and/or Pipes): No  Has patient been referred to the Quitline?: N/A patient is not a smoker  Brown, Brittney Poirier B 05/31/2014, 12:28 PM

## 2014-05-31 NOTE — [facility] Suicide Risk Assessment (Signed)
[facility]   Demographic Factors:  Caucasian and Unemployed  Total Time spent with patient: 30 minutes  Musculoskeletal: Strength & Muscle Tone: within normal limits Gait & Station: normal Patient leans: N/A  Psychiatric Specialty Exam: Physical Exam  ROS  Blood pressure 91/71, pulse 77, temperature 98.1 F (36.7 C), temperature source Oral, resp. rate 16, height 5\' 5"  (1.651 m), weight 63.504 kg (140 lb).Body mass index is 23.3 kg/(m^2).  General Appearance: Casual  Eye Contact::  Fair  Speech:  Clear and Coherent409  Volume:  Normal  Mood:  Euthymic  Affect:  Congruent  Thought Process:  Coherent  Orientation:  Full (Time, Place, and Person)  Thought Content:  WDL  Suicidal Thoughts:  No  Homicidal Thoughts:  No  Memory:  Immediate;   Fair Recent;   Fair Remote;   Fair  Judgement:  Fair  Insight:  Shallow  Psychomotor Activity:  Normal  Concentration:  Fair  Recall:  FiservFair  Fund of Knowledge:Fair  Language: Fair  Akathisia:  No  Handed:  Right  AIMS (if indicated):     Assets:  Communication Skills Desire for Improvement  Sleep:  Number of Hours: 6.5  Cognition: WNL  ADL's:  Intact   Have you used any form of tobacco in the last 30 days? (Cigarettes, Smokeless Tobacco, Cigars, and/or Pipes): No  Has this patient used any form of tobacco in the last 30 days? (Cigarettes, Smokeless Tobacco, Cigars, and/or Pipes) No  Mental Status Per Nursing Assessment::   On Admission:     Current Mental Status by Physician: PT DENIES SI/HI/AH/VH  Loss Factors: Decrease in vocational status, Loss of significant relationship and Financial problems/change in socioeconomic status  Historical Factors: Impulsivity  Risk Reduction Factors:   Sense of responsibility to family, Religious beliefs about death, Living with another person, especially a relative, Positive social support and Positive therapeutic relationship  Continued Clinical Symptoms:   Alcohol/Substance Abuse/Dependencies Previous Psychiatric Diagnoses and Treatments  Cognitive Features That Contribute To Risk:  Polarized thinking    Suicide Risk:  Minimal: No identifiable suicidal ideation.  Patients presenting with no risk factors but with morbid ruminations; may be classified as minimal risk based on the severity of the depressive symptoms  Principal Problem: Bipolar 1 disorder, mixed, moderate (resolved- acute phase) Discharge Diagnoses:  Patient Active Problem List   Diagnosis Date Noted  . Bipolar 1 disorder, mixed, moderate [F31.62] 05/27/2014  . 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 Sinusitis, acute [J01.90] 10/13/2013  . Overweight (BMI 25.0-29.9) [E66.3] 10/12/2013  . Abdominal pain, left lower quadrant [R10.32] 02/04/2013  . Yeast vaginitis [B37.3] 02/04/2013  . Other malaise and fatigue [R53.81, R53.83] 11/06/2012  . Low back pain [M54.5] 11/06/2012  . Menstrual cramp [N94.6] 03/24/2012  . Asthma [493] 06/28/2010  . Insomnia [G47.00] 06/28/2010  . ADHD [F90.9] 06/13/2007  . ANXIETY [F41.1] 09/19/2006  . DEPRESSION [F32.9] 09/19/2006    Follow-up Information    Follow up with [facility]. Go on 06/02/2014.   Why:  Wednesday at noon with Aggie Cosierrystal and MD Clyde CanterburyBahrani.   Contact information:   [address],  [place], [place] [address] Phone:(336) [phone] Fax:  [phone]      Plan Of Care/Follow-up recommendations:  Activity:  NO RESTRICTIONS Diet:  REGULAR Tests:  AS NEEDED Other:  FOLLOW UP WITH AFTER CARE AS SCHEDULED. Pt adviced to stop Vyvanse , discussed starting a nonstiumulant like Strattera - pt to discuss this with Dr.Barone -her out  patient psychiatrist.  Is patient on multiple antipsychotic therapies at discharge:  No   Has Patient had three or more failed trials of antipsychotic monotherapy by history:  No  Recommended Plan for Multiple Antipsychotic Therapies: NA    Arlington Sigmund md 05/31/2014, 11:38 AM

## 2014-05-31 NOTE — Progress Notes (Addendum)
Patient ID: [name], female   DOB: 15-Nov-1980, 34 y.o.   MRN: [id number]   Pt discharged home with a buss pass. Pt was stable and appreciative. All papers and prescriptions were given and valuables returned. Verbal understanding expressed. Denies SI/HI and A/VH. Pt given opportunity to express concerns and ask questions.   Pt did not have a cell phone charger in her belongings/locker. Pt stated "I don't want to report it, if you find it you can just call me at [phone]."

## 2014-05-31 NOTE — Discharge Summary (Signed)
Physician Discharge Summary Note  Patient:  [name] is an 34 y.o., female MRN:  [id number] DOB:  04-Apr-1980 Patient phone:  [phone] (home)  Patient address:   [place] [address],  Total Time spent with patient: 30 minutes  Date of Admission:  05/27/2014 Date of Discharge: 05/31/14  Reason for Admission:  Mood stabilization treatments  Principal Problem: Bipolar 1 disorder, mixed, moderate Discharge Diagnoses: Patient Active Problem List   Diagnosis Date Noted  . Bipolar 1 disorder, mixed, moderate [F31.62] 05/27/2014  . 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 Sinusitis, acute [J01.90] 10/13/2013  . Overweight (BMI 25.0-29.9) [E66.3] 10/12/2013  . Abdominal pain, left lower quadrant [R10.32] 02/04/2013  . Yeast vaginitis [B37.3] 02/04/2013  . Other malaise and fatigue [R53.81, R53.83] 11/06/2012  . Low back pain [M54.5] 11/06/2012  . Menstrual cramp [N94.6] 03/24/2012  . Asthma [493] 06/28/2010  . Insomnia [G47.00] 06/28/2010  . ADHD [F90.9] 06/13/2007  . ANXIETY [F41.1] 09/19/2006  . DEPRESSION [F32.9] 09/19/2006    Musculoskeletal: Strength & Muscle Tone: within normal limits Gait & Station: normal Patient leans: N/A  Psychiatric Specialty Exam: Physical Exam  Psychiatric: She has a normal mood and affect. Her speech is normal and behavior is normal. Judgment and thought content normal. Cognition and memory are normal.    Review of Systems  Constitutional: Negative.   HENT: Negative.   Eyes: Negative.   Respiratory: Negative.   Cardiovascular: Negative.   Gastrointestinal: Negative.   Genitourinary: Negative.   Musculoskeletal: Negative.   Skin: Negative.   Neurological: Negative.   Endo/Heme/Allergies: Negative.   Psychiatric/Behavioral: Negative for depression, suicidal ideas, hallucinations, memory loss and substance abuse. The patient is not nervous/anxious and does not have insomnia.     Blood pressure  91/71, pulse 77, temperature 98.1 F (36.7 C), temperature source Oral, resp. rate 16, height '5\' 5"'  (1.651 m), weight 63.504 kg (140 lb).Body mass index is 23.3 kg/(m^2).  See Physician SRA     Past Medical History:  Past Medical History  Diagnosis Date  . Abnormal Pap smear   . Headache(784.0)   . Anemia   . Anxiety   . PPH (postpartum hemorrhage)   . Asthma   . Blood transfusion     Pt delivery in 2009  . Complication of anesthesia   . Spinal headache    History reviewed. No pertinent past surgical history. Family History:  Family History  Problem Relation Age of Onset  . Thrombophlebitis Mother   . Hypertension Father   . Heart disease Maternal Grandmother   . Heart disease Paternal Grandfather   . Hypertension Paternal Grandfather    Social History:  History  Alcohol Use No     History  Drug Use  . Yes  . Special: Marijuana    History   Social History  . Marital Status: Single    Spouse Name: N/A  . Number of Children: N/A  . Years of Education: N/A   Social History Main Topics  . Smoking status: Former Research scientist (life sciences)  . Smokeless tobacco: Never Used  . Alcohol Use: No  . Drug Use: Yes    Special: Marijuana  . Sexual Activity: Not Currently     Comment: Pt currently pregnant   Other Topics Concern  . None   Social History Narrative    Risk to Self: Is patient at risk for suicide?: No What has been your use of drugs/alcohol within the last 12 months?: denies significant use of  any alcohol or drugs; drinks a "couple of times/year", only take "what ive been given, I dont take any illegal drugs" Risk to Others:   Prior Inpatient Therapy:   Prior Outpatient Therapy:    Level of Care:  OP  Hospital Course:   [name] is an 34 y.o.caucasian female who presented under IVC to [facility] via [employer]. Per initial notes in EHR - IVC taken out by Pollie Meyer (father of patient's children) IVC reads: Respondent has been previously diagnosed with a mental health  issue but petitioner is not sure of actual diagnosis. Family relates that she has been prescribed medication for her issues but she is non-compliant with her medication regimen. She has also been previously committed, two years ago. Respondent has been acting very erratically lately, she believes that the father of her children is a terrorist with [employer] and that people are out to get her. She told her mother that she is taking the children away. She has done this before and she randomly took the children to [facility] where she knows no one and she ended up hospitalized there and children were taken away. She has threatened to kill children's father. She has not been providing for her children either. She does not bathe them or regularly feed them. Family relates that she abuses prescription medications and marijuana."         [name] was admitted to the adult unit where she was evaluated and her symptoms were identified. Medication management was discussed and implemented. Patient was started on Tegretol 100 mg XR twice daily for improved mood stability. She was started on Geodon 20 mg daily and 40 mg at bedtime for psychotic symptoms. Her Vyvanse was not continued due to potential for abuse and possible adverse reactions. Her Klonopin was also decreased in dose to 0.25 mg bid prn in an effort to taper off medications that can be abused. She was encouraged to participate in unit programming. Medical problems were identified and treated appropriately. Home medication was restarted as needed.  She was evaluated each day by a clinical provider to ascertain the patient's response to treatment.  Improvement was noted by the patient's report of decreasing symptoms, improved sleep and appetite, affect, medication tolerance, behavior, and participation in unit programming.  The patient was asked each day to complete a self inventory noting mood, mental status, pain, new symptoms, anxiety and concerns.          She responded well to medication and being in a therapeutic and supportive environment. Nursing staff reported that the patient's mood continued to be manic. She was redirected for inviting a female patient to her room. Her medications were adjusted to stabilize her mood. Positive and appropriate behavior was noted and the patient was motivated for recovery.  She worked closely with the treatment team and case manager to develop a discharge plan with appropriate goals. Coping skills, problem solving as well as relaxation therapies were also part of the unit programming.         By the day of discharge she was in much improved condition than upon admission.  Symptoms were reported as significantly decreased or resolved completely. The patient denied SI/HI and voiced no AVH. She was motivated to continue taking medication with a goal of continued improvement in mental health. [name] was discharged home with a plan to follow up as noted below. The patient was provided with sample medications and prescriptions at time of discharge. She left [facility]  in stable condition with all belongings returned to her.   Consults:  None  Significant Diagnostic Studies:  Chemistry panel, Lipid profile, UDS positive for amphetamines and marijuana, TSH  Discharge Vitals:   Blood pressure 91/71, pulse 77, temperature 98.1 F (36.7 C), temperature source Oral, resp. rate 16, height '5\' 5"'  (1.651 m), weight 63.504 kg (140 lb). Body mass index is 23.3 kg/(m^2). Lab Results:   Results for orders placed or performed during the hospital encounter of 05/27/14 (from the past 72 hour(s))  Basic metabolic panel     Status: None   Collection Time: 05/29/14  7:25 PM  Result Value Ref Range   Sodium 141 135 - 145 mmol/L   Potassium 3.8 3.5 - 5.1 mmol/L   Chloride 104 96 - 112 mmol/L   CO2 28 19 - 32 mmol/L   Glucose, Bld 95 70 - 99 mg/dL   BUN 12 6 - 23 mg/dL   Creatinine, Ser 0.73 0.50 - 1.10 mg/dL   Calcium 9.0 8.4 - 10.5  mg/dL   GFR calc non Af Amer >90 >90 mL/min   GFR calc Af Amer >90 >90 mL/min    Comment: (NOTE) The eGFR has been calculated using the CKD EPI equation. This calculation has not been validated in all clinical situations. eGFR's persistently <90 mL/min signify possible Chronic Kidney Disease.    Anion gap 9 5 - 15    Comment: Performed at [facility]    Physical Findings: AIMS: Facial and Oral Movements Muscles of Facial Expression: None, normal Lips and Perioral Area: None, normal Jaw: None, normal Tongue: None, normal,Extremity Movements Upper (arms, wrists, hands, fingers): None, normal Lower (legs, knees, ankles, toes): None, normal, Trunk Movements Neck, shoulders, hips: None, normal, Overall Severity Severity of abnormal movements (highest score from questions above): None, normal Incapacitation due to abnormal movements: None, normal Patient's awareness of abnormal movements (rate only patient's report): No Awareness, Dental Status Current problems with teeth and/or dentures?: No Does patient usually wear dentures?: No  CIWA:    COWS:      See Psychiatric Specialty Exam and Suicide Risk Assessment completed by Attending Physician prior to discharge.  Discharge destination:  Home  Is patient on multiple antipsychotic therapies at discharge:  No   Has Patient had three or more failed trials of antipsychotic monotherapy by history:  No  Recommended Plan for Multiple Antipsychotic Therapies: NA     Medication List    STOP taking these medications        azithromycin 250 MG tablet  Commonly known as:  ZITHROMAX     chlorproMAZINE 25 MG tablet  Commonly known as:  THORAZINE     fluconazole 150 MG tablet  Commonly known as:  DIFLUCAN     meloxicam 15 MG tablet  Commonly known as:  MOBIC     predniSONE 20 MG tablet  Commonly known as:  DELTASONE     VYVANSE 40 MG capsule  Generic drug:  lisdexamfetamine      TAKE these medications       Indication   albuterol 108 (90 BASE) MCG/ACT inhaler  Commonly known as:  PROAIR HFA  Inhale 1 puff into the lungs every 4 (four) hours as needed. Dispense with spacer. For wheezing      carbamazepine 100 MG 12 hr tablet  Commonly known as:  TEGRETOL XR  Take 1 tablet (100 mg total) by mouth 2 (two) times daily.   Indication:  Manic-Depression  cetirizine 10 MG tablet  Commonly known as:  ZYRTEC  Take 1 tablet (10 mg total) by mouth daily as needed for allergies.   Indication:  Hayfever     clonazePAM 0.5 MG tablet  Commonly known as:  KLONOPIN  Take 0.5 tablets (0.25 mg total) by mouth 2 (two) times daily as needed (anxiety/withdrawal).   Indication:  Manic-Depression, Anxiety     fluticasone 50 MCG/ACT nasal spray  Commonly known as:  FLONASE  Place 2 sprays into both nostrils daily as needed for allergies.   Indication:  Hayfever     Iron 325 (65 FE) MG Tabs  Take 1 tablet by mouth daily.   Indication:  Iron Deficiency     sennosides-docusate sodium 8.6-50 MG tablet  Commonly known as:  SENOKOT-S  Take 1 tablet by mouth daily as needed for constipation.      traZODone 50 MG tablet  Commonly known as:  DESYREL  Take 1 tablet (50 mg total) by mouth at bedtime and may repeat dose one time if needed.   Indication:  Trouble Sleeping     ziprasidone 20 MG capsule  Commonly known as:  GEODON  Take 1 capsule (20 mg total) by mouth daily with breakfast. For mood control.   Indication:  Manic-Depression     ziprasidone 40 MG capsule  Commonly known as:  GEODON  Take 1 capsule (40 mg total) by mouth daily with supper. For mood control   Indication:  Manic-Depression           Follow-up Information    Go to [facility].   Why:  Patient current w therapist Donella Stade and MD Para Skeans.   Contact information:   [address]  [place], [place] [address] Phone:([phone] Fax:  [phone]      Follow-up recommendations:   Activity: NO  RESTRICTIONS Diet: REGULAR Tests: AS NEEDED Other: FOLLOW UP WITH AFTER CARE AS SCHEDULED. Pt adviced to stop Vyvanse , discussed starting a nonstiumulant like Strattera - pt to discuss this with Dr.Barone -her out patient psychiatrist.  Comments:   Take all your medications as prescribed by your mental healthcare provider.  Report any adverse effects and or reactions from your medicines to your outpatient provider promptly.  Patient is instructed and cautioned to not engage in alcohol and or illegal drug use while on prescription medicines.  In the event of worsening symptoms, patient is instructed to call the crisis hotline, 911 and or go to the nearest ED for appropriate evaluation and treatment of symptoms.  Follow-up with your primary care provider for your other medical issues, concerns and or health care needs.   Total Discharge Time: Greater than 30 minutes  Signed: Marchia Diguglielmo NP-C 05/31/2014, 10:00 AM

## 2014-05-31 NOTE — Progress Notes (Signed)
D: Pt informed the writer that she's feeling "a little" better today. Stated that the meds sometimes makes her a little tired". Pt informed the writer that she's pending possible discharge tomorrow. Pt voiced no questions or concerns.  A:  Support and encouragement was offered. 15 min checks continued for safety.  R: Pt remains safe.

## 2014-06-11 ENCOUNTER — Ambulatory Visit: Payer: Self-pay | Admitting: Family Medicine

## 2014-06-15 ENCOUNTER — Ambulatory Visit: Payer: Self-pay | Admitting: Family Medicine

## 2014-07-10 ENCOUNTER — Emergency Department (HOSPITAL_COMMUNITY)
Admission: EM | Admit: 2014-07-10 | Discharge: 2014-07-10 | Disposition: A | Discharge status: Other Acute Inpatient Hospital | Payer: Medicaid Other | Attending: Emergency Medicine | Admitting: Emergency Medicine

## 2014-07-10 ENCOUNTER — Encounter (HOSPITAL_COMMUNITY): Payer: Self-pay | Admitting: Emergency Medicine

## 2014-07-10 DIAGNOSIS — Z880 Allergy status to penicillin: Secondary | ICD-10-CM | POA: Insufficient documentation

## 2014-07-10 DIAGNOSIS — R21 Rash and other nonspecific skin eruption: Secondary | ICD-10-CM | POA: Insufficient documentation

## 2014-07-10 DIAGNOSIS — Z7951 Long term (current) use of inhaled steroids: Secondary | ICD-10-CM | POA: Insufficient documentation

## 2014-07-10 DIAGNOSIS — Z79899 Other long term (current) drug therapy: Secondary | ICD-10-CM | POA: Diagnosis not present

## 2014-07-10 DIAGNOSIS — R441 Visual hallucinations: Secondary | ICD-10-CM | POA: Diagnosis not present

## 2014-07-10 DIAGNOSIS — G4700 Insomnia, unspecified: Secondary | ICD-10-CM | POA: Diagnosis present

## 2014-07-10 DIAGNOSIS — Z3202 Encounter for pregnancy test, result negative: Secondary | ICD-10-CM | POA: Diagnosis not present

## 2014-07-10 DIAGNOSIS — J45909 Unspecified asthma, uncomplicated: Secondary | ICD-10-CM | POA: Diagnosis present

## 2014-07-10 DIAGNOSIS — R443 Hallucinations, unspecified: Secondary | ICD-10-CM | POA: Diagnosis present

## 2014-07-10 DIAGNOSIS — Z8249 Family history of ischemic heart disease and other diseases of the circulatory system: Secondary | ICD-10-CM

## 2014-07-10 DIAGNOSIS — F419 Anxiety disorder, unspecified: Secondary | ICD-10-CM | POA: Diagnosis present

## 2014-07-10 DIAGNOSIS — Z87891 Personal history of nicotine dependence: Secondary | ICD-10-CM | POA: Insufficient documentation

## 2014-07-10 DIAGNOSIS — D649 Anemia, unspecified: Secondary | ICD-10-CM | POA: Diagnosis not present

## 2014-07-10 DIAGNOSIS — F1510 Other stimulant abuse, uncomplicated: Secondary | ICD-10-CM | POA: Diagnosis present

## 2014-07-10 DIAGNOSIS — F909 Attention-deficit hyperactivity disorder, unspecified type: Secondary | ICD-10-CM | POA: Diagnosis present

## 2014-07-10 DIAGNOSIS — F3163 Bipolar disorder, current episode mixed, severe, without psychotic features: Principal | ICD-10-CM | POA: Diagnosis present

## 2014-07-10 DIAGNOSIS — F1220 Cannabis dependence, uncomplicated: Secondary | ICD-10-CM | POA: Diagnosis present

## 2014-07-10 LAB — RAPID URINE DRUG SCREEN, HOSP PERFORMED
Amphetamines: NOT DETECTED
Barbiturates: NOT DETECTED
Benzodiazepines: NOT DETECTED
COCAINE: NOT DETECTED
Opiates: NOT DETECTED
TETRAHYDROCANNABINOL: POSITIVE — AB

## 2014-07-10 LAB — ETHANOL

## 2014-07-10 LAB — COMPREHENSIVE METABOLIC PANEL
ALBUMIN: 4.1 g/dL (ref 3.5–5.0)
ALT: 19 U/L (ref 14–54)
ANION GAP: 8 (ref 5–15)
AST: 17 U/L (ref 15–41)
Alkaline Phosphatase: 66 U/L (ref 38–126)
BUN: 13 mg/dL (ref 6–20)
CO2: 21 mmol/L — AB (ref 22–32)
CREATININE: 0.69 mg/dL (ref 0.44–1.00)
Calcium: 8.9 mg/dL (ref 8.9–10.3)
Chloride: 107 mmol/L (ref 101–111)
GFR calc Af Amer: >60 mL/min (ref >60)
GFR calc non Af Amer: >60 mL/min (ref >60)
Glucose, Bld: 96 mg/dL (ref 65–99)
Potassium: 4.1 mmol/L (ref 3.5–5.1)
Sodium: 136 mmol/L (ref 135–145)
Total Bilirubin: 0.6 mg/dL (ref 0.3–1.2)
Total Protein: 7.3 g/dL (ref 6.5–8.1)

## 2014-07-10 LAB — POC URINE PREG, ED: Preg Test, Ur: NEGATIVE

## 2014-07-10 LAB — CBC
HCT: 45.6 % (ref 36.0–46.0)
Hemoglobin: 15.1 g/dL — ABNORMAL HIGH (ref 12.0–15.0)
MCH: 31.5 pg (ref 26.0–34.0)
MCHC: 33.1 g/dL (ref 30.0–36.0)
MCV: 95.2 fL (ref 78.0–100.0)
Platelets: 223 10*3/uL (ref 150–400)
RBC: 4.79 MIL/uL (ref 3.87–5.11)
RDW: 13.6 % (ref 11.5–15.5)
WBC: 11.6 10*3/uL — ABNORMAL HIGH (ref 4.0–10.5)

## 2014-07-10 LAB — ACETAMINOPHEN LEVEL: Acetaminophen (Tylenol), Serum: <10 ug/mL — ABNORMAL LOW (ref 10–30)

## 2014-07-10 LAB — SALICYLATE LEVEL: Salicylate Lvl: <4 mg/dL (ref 2.8–30.0)

## 2014-07-10 MED ORDER — ZOLPIDEM TARTRATE 5 MG PO TABS
5.0000 mg | ORAL_TABLET | Freq: Every evening | ORAL | Status: DC | PRN
  Administered 2014-07-10: 5 mg via ORAL
  Filled 2014-07-10: qty 1

## 2014-07-10 MED ORDER — ALUM & MAG HYDROXIDE-SIMETH 200-200-20 MG/5ML PO SUSP: 30.0000 mL | ORAL | Status: DC | PRN

## 2014-07-10 MED ORDER — LORAZEPAM 1 MG PO TABS
1.0000 mg | ORAL_TABLET | Freq: Three times a day (TID) | ORAL | Status: DC | PRN
  Administered 2014-07-10: 1 mg via ORAL
  Filled 2014-07-10: qty 1

## 2014-07-10 MED ORDER — HYDROCORTISONE 1 % EX CREA
TOPICAL_CREAM | Freq: Two times a day (BID) | CUTANEOUS | Status: DC
  Administered 2014-07-10: 22:00:00 via TOPICAL
  Administered 2014-07-10: 1 via TOPICAL
  Filled 2014-07-10: qty 28

## 2014-07-10 MED ORDER — IBUPROFEN 200 MG PO TABS: 600.0000 mg | ORAL_TABLET | Freq: Three times a day (TID) | ORAL | Status: DC | PRN

## 2014-07-10 MED ORDER — LORAZEPAM 1 MG PO TABS
1.0000 mg | ORAL_TABLET | Freq: Once | ORAL | Status: AC
  Administered 2014-07-10: 1 mg via ORAL
  Filled 2014-07-10: qty 1

## 2014-07-10 MED ORDER — ONDANSETRON HCL 4 MG PO TABS: 4.0000 mg | ORAL_TABLET | Freq: Three times a day (TID) | ORAL | Status: DC | PRN

## 2014-07-10 MED ORDER — NICOTINE 21 MG/24HR TD PT24
21.0000 mg | MEDICATED_PATCH | Freq: Every day | TRANSDERMAL | Status: DC
  Administered 2014-07-10: 21 mg via TRANSDERMAL
  Filled 2014-07-10: qty 1

## 2014-07-10 NOTE — ED Notes (Signed)
Up on the phone 

## 2014-07-10 NOTE — ED Notes (Signed)
Pt. Noted in room. Pt. C/o some anxiety and insomnia. No distress or abnormal behavior noted. Will continue to monitor with security cameras. Q 15 minute rounds continue.

## 2014-07-10 NOTE — ED Provider Notes (Signed)
CSN: [id number]     Arrival date & time 07/10/14  1029 History   First MD Initiated Contact with Patient 07/10/14 1035     Chief Complaint  Patient presents with  . Hallucinations     (Consider location/radiation/quality/duration/timing/severity/associated sxs/prior Treatment) HPI Comments: 34 year old female presenting from behavioral health for medical clearance. She was already evaluated by TTS, no beds are available at this time, however meets inpatient criteria. Patient reports a strong history of anxiety and depression, states she cannot handle all of her stressors right now. States she occasionally thinks of suicidal ideations, however does not intent to act on them. She has been on Adderall for over a year, and believes this is putting her into psychosis. Endorses visual and auditory hallucinations, however is having difficulty describing them. States she occasionally believes she sees things, and then realizes that they are actually not there. Denies homicidal ideations. States she has poison ivy in both of her arms as she was helping an elderly woman do weeding the other day. It is itchy.  The history is provided by the patient and medical records.    Past Medical History  Diagnosis Date  . Abnormal Pap smear   . Headache(784.0)   . Anemia   . Anxiety   . PPH (postpartum hemorrhage)   . Asthma   . Blood transfusion     Pt delivery in 2009  . Complication of anesthesia   . Spinal headache    History reviewed. No pertinent past surgical history. Family History  Problem Relation Age of Onset  . Thrombophlebitis Mother   . Hypertension Father   . Heart disease Maternal Grandmother   . Heart disease Paternal Grandfather   . Hypertension Paternal Grandfather    History  Substance Use Topics  . Smoking status: Former Games developer  . Smokeless tobacco: Never Used  . Alcohol Use: No   OB History    Gravida Para Term Preterm AB TAB SAB Ectopic Multiple Living   Review of Systems  Skin: Positive for rash.  Psychiatric/Behavioral: Positive for hallucinations, dysphoric mood and decreased concentration. The patient is nervous/anxious.   All other systems reviewed and are negative.     Allergies  Codeine; Haldol; Lamictal; Amoxicillin; Keflex; and Penicillins  Home Medications   Prior to Admission medications   Medication Sig Start Date End Date Taking? Authorizing Provider  albuterol (PROAIR HFA) 108 (90 BASE) MCG/ACT inhaler Inhale 1 puff into the lungs every 4 (four) hours as needed. Dispense with spacer. For wheezing 10/12/13  Yes Leona Singleton, MD  carbamazepine (TEGRETOL XR) 100 MG 12 hr tablet Take 1 tablet (100 mg total) by mouth 2 (two) times daily. 05/31/14  Yes Thermon Leyland, NP  cetirizine (ZYRTEC) 10 MG tablet Take 1 tablet (10 mg total) by mouth daily as needed for allergies. 05/31/14  Yes Thermon Leyland, NP  clonazePAM (KLONOPIN) 1 MG tablet Take 1 mg by mouth 2 (two) times daily. 06/16/14  Yes Historical Provider, MD  sennosides-docusate sodium (SENOKOT-S) 8.6-50 MG tablet Take 1 tablet by mouth daily as needed for constipation.   Yes Historical Provider, MD  traZODone (DESYREL) 100 MG tablet TAKE 1 OR 2 TABLETS AT BEDTIME AS NEEDED FOR SLEEP OR DEPRESSION. 07/08/14  Yes Historical Provider, MD  clonazePAM (KLONOPIN) 0.5 MG tablet Take 0.5 tablets (0.25 mg total) by mouth 2 (two) times daily as needed (anxiety/withdrawal). Patient taking differently: Take  1 mg by mouth 2 (two) times daily as needed (anxiety/withdrawal). 1mg  bid 05/31/14   Thermon Leyland, NP  Ferrous Sulfate (IRON) 325 (65 FE) MG TABS Take 1 tablet by mouth daily. Patient not taking: Reported on 07/10/2014 05/31/14   Thermon Leyland, NP  fluticasone [facility]) 50 MCG/ACT nasal spray Place 2 sprays into both nostrils daily as needed for allergies. 05/31/14   Thermon Leyland, NP  traZODone (DESYREL) 50 MG tablet Take 1 tablet (50 mg total) by mouth at bedtime and may repeat dose  one time if needed. Patient not taking: Reported on 07/10/2014 05/31/14   Thermon Leyland, NP  ziprasidone (GEODON) 20 MG capsule Take 1 capsule (20 mg total) by mouth daily with breakfast. For mood control. Patient not taking: Reported on 07/10/2014 05/31/14   Thermon Leyland, NP  ziprasidone (GEODON) 40 MG capsule Take 1 capsule (40 mg total) by mouth daily with supper. For mood control Patient not taking: Reported on 07/10/2014 05/31/14   Thermon Leyland, NP   BP 98/56 mmHg  Pulse 81  Temp(Src) 98 F (36.7 C) (Oral)  Resp 16  SpO2 96% Physical Exam  Constitutional: She is oriented to person, place, and time. She appears well-developed and well-nourished. No distress.  HENT:  Head: Normocephalic and atraumatic.  Mouth/Throat: Oropharynx is clear and moist.  Eyes: Conjunctivae and EOM are normal.  Neck: Normal range of motion. Neck supple.  Cardiovascular: Normal rate, regular rhythm and normal heart sounds.   Pulmonary/Chest: Effort normal and breath sounds normal. No respiratory distress.  Musculoskeletal: Normal range of motion. She exhibits no edema.  Neurological: She is alert and oriented to person, place, and time. No sensory deficit.  Skin: Skin is warm and dry.  Urticarial rash on bilateral arms. No secondary infection.  Psychiatric: Her behavior is normal. Her mood appears anxious. She exhibits a depressed mood. She expresses no homicidal and no suicidal ideation.  Tearful.  Nursing note and vitals reviewed.   ED Course  Procedures (including critical care time) Labs Review Labs Reviewed  ACETAMINOPHEN LEVEL - Abnormal; Notable for the following:    Acetaminophen (Tylenol), Serum <10 (*)    All other components within normal limits  CBC - Abnormal; Notable for the following:    WBC 11.6 (*)    Hemoglobin 15.1 (*)    All other components within normal limits  COMPREHENSIVE METABOLIC PANEL - Abnormal; Notable for the following:    CO2 21 (*)    All other components within normal  limits  URINE RAPID DRUG SCREEN, HOSP PERFORMED - Abnormal; Notable for the following:    Tetrahydrocannabinol POSITIVE (*)    All other components within normal limits  ETHANOL  SALICYLATE LEVEL  POC URINE PREG, ED    Imaging Review No results found.   EKG Interpretation None      MDM   Final diagnoses:  Visual hallucinations  Anxiety   NAD. Anxious and has visual hallucinations. Medically cleared. TTS consult completed before arrival. Inpatient treatment. Awaiting placement.  Kathrynn Speed, PA-C 07/10/14 1740  Toy Cookey, MD 07/11/14 1023

## 2014-07-10 NOTE — ED Notes (Signed)
Pt reports she feels anxious. Pt thinks her adderall is putting her into a psychosis. Pt reports auditory and visual hallucinations. No HI. Has had some thoughts of SI, but none today.

## 2014-07-10 NOTE — BHH Counselor (Signed)
Patient reviewed and signed voluntary consent to treat and ROI, provided to Jillyn Hidden, Charity fundraiser.

## 2014-07-10 NOTE — Progress Notes (Signed)
Disposition CSW completed referrals to the following inpatient psych facilities:  [facility]  CSW will continue to assist patient with their disposition needs.  Seward Speck [facility] Disposition CSW [phone]

## 2014-07-10 NOTE — ED Provider Notes (Signed)
Patient accepted to behavioral health. Accepting physician is Dr. Elna Breslow.  Pricilla Loveless, MD 07/10/14 [phone]

## 2014-07-10 NOTE — ED Notes (Signed)
Pt. Noted sleeping in room. No complaints or concerns voiced. No distress or abnormal behavior noted. Will continue to monitor with security cameras. Q 15 minute rounds continue. 

## 2014-07-10 NOTE — ED Notes (Signed)
Pt. Noted in room. No complaints or concerns voiced. No distress or abnormal behavior noted. Will continue to monitor with security cameras. Q 15 minute rounds continue. 

## 2014-07-10 NOTE — BH Assessment (Addendum)
Tele Assessment Note   [name] is a 34 y.o. female. Pt presents voluntarily to [facility] for an assessment accompanied by her ex partner and father of her two children (2 & 7), [name]. Pt is cooperative and oriented x 4. Her affect is anxious. She is very tearful. She sits with legs crossed while moving hands up and down her arms and her lap. She occasionally rocks. Pt says, "I don't feel like a human being anymore, and I just want to die." She also sts, "My brain feels like mush." Pt says she is experiencing "psychosis". She reports she is hearing things and seeing shadows move which are not really there.  Pt endorses SI with no plan. Per chart review, pt was admitted to [facility] April 2016 after [facility] placed her under IVC. Pt sts, "I need help." Pt reports paranoia. She sts she has an ACTT team with [facility] and sees Dr Clyde Canterbury for med management. Pt sts she stopped eating for a long time but has been eating lately. She reports she sleeps approx 6 hrs or none at all. Pt reports she hasn't been taking Vyvanse as directed. 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 She endorses fatigue, tearfulness, guilt and worthlessness. Pt is a Buyer, retail of [employer]  [name] provides collateral info. He says for the past three days pt will speak to people who aren't there. He says that their two children live with her. Her reports great concern about pt's mental health and physical safety. [name] reports pt hasn't called to check in on her kids in approx 6 weeks. He says pt has never gone this long without speaking to or seeing the kids.  Writer ran pt by Claudette Head NP who recommends inpatient treatment. [facility] is currently at capacity, so pt is being transferred to [facility] for med clearance. TTS will continue to seek inpatient placement for pt.    Axis I: Bipolar Disorder, Type I              Generalized Anxiety Disorder Axis II: Deferred Axis III:  Past Medical History  Diagnosis Date  . Abnormal Pap smear   . Headache(784.0)   . Anemia   . Anxiety   . PPH (postpartum hemorrhage)   . Asthma   . Blood transfusion     Pt delivery in 2009  . Complication of anesthesia   . Spinal headache    Axis IV: housing problems, other psychosocial or environmental problems, problems related to social environment and problems with primary support group Axis V: 31-40 impairment in reality testing  Past Medical History:  Past Medical History  Diagnosis Date  . Abnormal Pap smear   . Headache(784.0)   . Anemia   . Anxiety   . PPH (postpartum hemorrhage)   . Asthma   . Blood transfusion     Pt delivery in 2009  . Complication of anesthesia   . Spinal headache     History reviewed. No pertinent past surgical history.  Family History:  Family History  Problem Relation Age of Onset  . Thrombophlebitis Mother   . Hypertension Father   . Heart disease Maternal Grandmother   . Heart disease Paternal Grandfather   . Hypertension Paternal Grandfather     Social History:  reports that she has quit smoking. She has never used smokeless tobacco. She reports that  she uses illicit drugs (Marijuana). She reports that she does not drink alcohol.  Additional Social History:  Alcohol / Drug Use Pain Medications: SEE MAR Prescriptions: SEE MAR Over the Counter: SEE MAR History of alcohol / drug use?: No history of alcohol / drug abuse Negative Consequences of Use: Personal relationships  CIWA: CIWA-Ar BP: 91/71 mmHg Pulse Rate: 77 COWS:    PATIENT STRENGTHS: (choose at least two) Average or above average intelligence Communication skills Motivation for treatment/growth  Allergies:  Allergies  Allergen Reactions  . Codeine Nausea And Vomiting  . Lamictal [Lamotrigine] Swelling    Of the face and throat  . Amoxicillin Rash  . Keflex [Cephalexin] Rash  . Penicillins Rash     Home Medications:  No prescriptions prior to admission    OB/GYN Status:  No LMP recorded.  General Assessment Data Marital status: Single Living Arrangements: Children (Lives w children (2, 7) at home) Admission Status: Involuntary     Crisis Care Plan Living Arrangements: Children (Lives w children (2, 7) at home)  Education Status Highest grade of school patient has completed: college graduate  Risk to self with the past 6 months Is patient at risk for suicide?: No What has been your use of drugs/alcohol within the last 12 months?: denies significant use of any alcohol or drugs; drinks a "couple of times/year", only take "what ive been given, I dont take any illegal drugs" Substance abuse history and/or treatment for substance abuse?: No        Mental Status Report Appearance/Hygiene: Unremarkable Speech: Logical/coherent Mood: Pleasant Affect: Appropriate to circumstance     ADLScreening [facility] Assessment Services) Patient's cognitive ability adequate to safely complete daily activities?: Yes Patient able to express need for assistance with ADLs?: Yes Independently performs ADLs?: Yes (appropriate for developmental age)        ADL Screening (condition at time of admission) Patient's cognitive ability adequate to safely complete daily activities?: Yes Is the patient deaf or have difficulty hearing?: No Does the patient have difficulty seeing, even when wearing glasses/contacts?: No Does the patient have difficulty concentrating, remembering, or making decisions?: Yes Patient able to express need for assistance with ADLs?: Yes Does the patient have difficulty dressing or bathing?: No Independently performs ADLs?: Yes (appropriate for developmental age) Communication: Independent Dressing (OT): Independent Grooming: Independent Feeding: Independent Bathing: Independent Toileting: Independent In/Out Bed: Independent Walks in Home: Independent Does the  patient have difficulty walking or climbing stairs?: No Weakness of Legs: None Weakness of Arms/Hands: None  Home Assistive Devices/Equipment Home Assistive Devices/Equipment: None  Therapy Consults (therapy consults require a physician order) PT Evaluation Needed: No OT Evalulation Needed: No SLP Evaluation Needed: No Abuse/Neglect Assessment (Assessment to be complete while patient is alone) Physical Abuse: Denies Verbal Abuse: Denies Sexual Abuse: Denies Exploitation of patient/patient's resources: Denies Self-Neglect: Denies Values / Beliefs Cultural Requests During Hospitalization: None Spiritual Requests During Hospitalization: None Consults Spiritual Care Consult Needed: No Social Work Consult Needed: No Merchant navy officer (For Healthcare) Does patient have an advance directive?: No Would patient like information on creating an advanced directive?: No - patient declined information Nutrition Screen- [facility] Adult/WL/AP Patient's home diet: Regular Has the patient recently lost weight without trying?: No Has the patient been eating poorly because of a decreased appetite?: No Malnutrition Screening Tool Score: 0        Disposition:    Claudette Head NP recommends inpatient treatment. [facility] is currently at capacity, so pt is being transferred to [facility] for med clearance.  TTS will continue to seek inpatient placement for pt.   Aryaan Persichetti P 07/10/2014 10:18 AM

## 2014-07-10 NOTE — ED Notes (Signed)
Up to the bathroom 

## 2014-07-10 NOTE — ED Notes (Signed)
Pt ambulatory w/o difficulty from triage 

## 2014-07-10 NOTE — ED Notes (Addendum)
Report received from Lizbeth Bark RN. Pt. Alert and oriented in no distress denies SI, HI, AH and pain.  Pt. States that she sees things occasionally like movement. Pt. Instructed to come to me with problems or concerns.Will continue to monitor for safety via security cameras and Q 15 minute checks.

## 2014-07-11 ENCOUNTER — Inpatient Hospital Stay (HOSPITAL_COMMUNITY)
Admission: AD | Admit: 2014-07-11 | Discharge: 2014-07-16 | DRG: 885 | Disposition: A | Discharge status: Home / Self Care | Payer: Medicaid Other | Source: Intra-hospital | Attending: Psychiatry | Admitting: Psychiatry

## 2014-07-11 ENCOUNTER — Encounter (HOSPITAL_COMMUNITY): Payer: Self-pay

## 2014-07-11 DIAGNOSIS — F3163 Bipolar disorder, current episode mixed, severe, without psychotic features: Secondary | ICD-10-CM | POA: Diagnosis not present

## 2014-07-11 DIAGNOSIS — F1510 Other stimulant abuse, uncomplicated: Secondary | ICD-10-CM | POA: Diagnosis present

## 2014-07-11 DIAGNOSIS — J45909 Unspecified asthma, uncomplicated: Secondary | ICD-10-CM | POA: Diagnosis present

## 2014-07-11 DIAGNOSIS — R45851 Suicidal ideations: Secondary | ICD-10-CM

## 2014-07-11 DIAGNOSIS — F1220 Cannabis dependence, uncomplicated: Secondary | ICD-10-CM | POA: Diagnosis present

## 2014-07-11 DIAGNOSIS — F909 Attention-deficit hyperactivity disorder, unspecified type: Secondary | ICD-10-CM | POA: Diagnosis present

## 2014-07-11 DIAGNOSIS — Z8249 Family history of ischemic heart disease and other diseases of the circulatory system: Secondary | ICD-10-CM | POA: Diagnosis not present

## 2014-07-11 DIAGNOSIS — F419 Anxiety disorder, unspecified: Secondary | ICD-10-CM | POA: Diagnosis present

## 2014-07-11 DIAGNOSIS — F1590 Other stimulant use, unspecified, uncomplicated: Secondary | ICD-10-CM | POA: Diagnosis present

## 2014-07-11 DIAGNOSIS — F319 Bipolar disorder, unspecified: Secondary | ICD-10-CM | POA: Diagnosis present

## 2014-07-11 DIAGNOSIS — G4700 Insomnia, unspecified: Secondary | ICD-10-CM | POA: Diagnosis present

## 2014-07-11 MED ORDER — TRAZODONE HCL 100 MG PO TABS
ORAL_TABLET | ORAL | Status: AC
  Administered 2014-07-11: 23:00:00
  Filled 2014-07-11: qty 1

## 2014-07-11 MED ORDER — TRAZODONE HCL 100 MG PO TABS
100.0000 mg | ORAL_TABLET | Freq: Every day | ORAL | Status: DC
  Administered 2014-07-11: 02:00:00 via ORAL
  Administered 2014-07-11 – 2014-07-12 (×2): 100 mg via ORAL
  Filled 2014-07-11 (×2): qty 1

## 2014-07-11 MED ORDER — GABAPENTIN 100 MG PO CAPS
100.0000 mg | ORAL_CAPSULE | Freq: Three times a day (TID) | ORAL | Status: DC
  Administered 2014-07-11 – 2014-07-14 (×9): 100 mg via ORAL
  Filled 2014-07-11 (×12): qty 1

## 2014-07-11 MED ORDER — ACETAMINOPHEN 325 MG PO TABS
650.0000 mg | ORAL_TABLET | Freq: Four times a day (QID) | ORAL | Status: DC | PRN
  Administered 2014-07-14: 650 mg via ORAL
  Filled 2014-07-11: qty 2

## 2014-07-11 MED ORDER — ZIPRASIDONE HCL 20 MG PO CAPS
20.0000 mg | ORAL_CAPSULE | Freq: Two times a day (BID) | ORAL | Status: DC
  Administered 2014-07-11 – 2014-07-12 (×4): 20 mg via ORAL
  Filled 2014-07-11 (×7): qty 1

## 2014-07-11 MED ORDER — HYDROCORTISONE 0.5 % EX CREA
TOPICAL_CREAM | Freq: Three times a day (TID) | CUTANEOUS | Status: DC
  Administered 2014-07-11 – 2014-07-13 (×6): via TOPICAL
  Filled 2014-07-11: qty 28.35

## 2014-07-11 MED ORDER — ALUM & MAG HYDROXIDE-SIMETH 200-200-20 MG/5ML PO SUSP
30.0000 mL | ORAL | Status: DC | PRN
  Administered 2014-07-11: 30 mL via ORAL
  Filled 2014-07-11: qty 30

## 2014-07-11 MED ORDER — OLANZAPINE 5 MG PO TBDP
5.0000 mg | ORAL_TABLET | Freq: Four times a day (QID) | ORAL | Status: DC | PRN
Start: 2014-07-11 — End: 2014-07-17
  Administered 2014-07-11 – 2014-07-15 (×6): 5 mg via ORAL
  Filled 2014-07-11 (×6): qty 1

## 2014-07-11 MED ORDER — NICOTINE POLACRILEX 2 MG MT GUM
2.0000 mg | CHEWING_GUM | OROMUCOSAL | Status: DC | PRN
Start: 2014-07-11 — End: 2014-07-17
  Administered 2014-07-11 – 2014-07-16 (×18): 2 mg via ORAL
  Filled 2014-07-11 (×4): qty 1

## 2014-07-11 MED ORDER — LORAZEPAM 1 MG PO TABS
1.0000 mg | ORAL_TABLET | Freq: Once | ORAL | Status: AC
  Administered 2014-07-11: 1 mg via ORAL
  Filled 2014-07-11: qty 1

## 2014-07-11 MED ORDER — LITHIUM CARBONATE 300 MG PO CAPS
300.0000 mg | ORAL_CAPSULE | Freq: Two times a day (BID) | ORAL | Status: DC
  Administered 2014-07-11 – 2014-07-12 (×3): 300 mg via ORAL
  Filled 2014-07-11 (×6): qty 1

## 2014-07-11 MED ORDER — TRAZODONE HCL 100 MG PO TABS
ORAL_TABLET | ORAL | Status: AC
  Filled 2014-07-11: qty 1

## 2014-07-11 NOTE — [facility] Group Notes (Signed)
[facility] Group Notes:  (Clinical Social Work)  07/11/2014  [facility] Group Notes:  (Clinical Social Work)  07/11/2014  11:00AM-12:00PM  Summary of Progress/Problems:  The main focus of today's process group was to listen to a variety of genres of music and to identify that different types of music provoke different responses.  The patient then was able to identify personally what was soothing for them, as well as energizing.  Handouts were used to record feelings evoked, as well as how patient can personally use this knowledge in sleep habits, with depression, and with other symptoms.  The patient expressed understanding of concepts, as well as knowledge of how each type of music affected him/her and how this can be used at home as a wellness/recovery tool.  She was engaged and helpful to other patients.  Type of Therapy:  Music Therapy   Participation Level:  Active  Participation Quality:  Attentive and Sharing  Affect:  Blunted  Cognitive:  Oriented  Insight:  Engaged  Engagement in Therapy:  Engaged  Modes of Intervention:   Activity, Exploration  Ambrose Mantle, LCSW 07/11/2014

## 2014-07-11 NOTE — Tx Team (Signed)
Initial Interdisciplinary Treatment Plan   PATIENT STRESSORS: Financial difficulties Health problems Medication change or noncompliance Substance abuse   PATIENT STRENGTHS: Capable of independent living Manufacturing systems engineer Motivation for treatment/growth Supportive family/friends   PROBLEM LIST: Problem List/Patient Goals Date to be addressed Date deferred Reason deferred Estimated date of resolution  Depression       Anxiety      Hallucinations      "I want to really get better this time around instead of faking feeling better like I did last time"                                     DISCHARGE CRITERIA:  Adequate post-discharge living arrangements Improved stabilization in mood, thinking, and/or behavior Motivation to continue treatment in a less acute level of care Safe-care adequate arrangements made  PRELIMINARY DISCHARGE PLAN: Attend PHP/IOP Participate in family therapy Return to previous living arrangement  PATIENT/FAMIILY INVOLVEMENT: This treatment plan has been presented to and reviewed with the patient, [name], and/or family member.  The patient and family have been given the opportunity to ask questions and make suggestions.  [name] T Marisella Puccio 07/11/2014, 2:41 AM

## 2014-07-11 NOTE — Progress Notes (Signed)
Admission note  [name] is a 33y/o female who came into the unit voluntary complaining of anxiety, depression, hopelessness and some visual hallucinations; she states, "I see shadowy figures." [name] at this time alert and cooperative denies SI/HI. [name] states she had been smoking THC for about 3 weeks however denies other substance abuse. Pt was here in April 2016. [name] state, "I want to really get better this time around instead of faking feeling better like I did last time" Pt also states that she went back to her old med after her D/C from [facility]. Pt would always be monitored for safety.

## 2014-07-11 NOTE — Progress Notes (Signed)
D: Pt presents anxious on approach, fidgety and pacing. Pt denies suicidal thoughts. Pt endorses AVH. Pt unable to elaborate on AVH.  Pt rates depression 5/10. Hopeless 5/10. Anxiety 10. Pt c/o rash to bilateral arms. Elna Breslow, MD made aware along with [facility] this morning. Pt started back on hydrocortisone. Pt compliant with taking meds and attending groups.  A: Medications administered as ordered per MD. Verbal support given. Pt encouraged to attend groups. 15 minute checks performed for safety. R: Pt stated goal is to get settled and get on the right meds. Pt receptive to treatment.
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
## 2014-07-11 NOTE — B[facility] Suicide Risk Assessment (Signed)
[facility] Admission Suicide Risk Assessment   Nursing information obtained from:    Demographic factors:    Current Mental Status:    Loss Factors:    Historical Factors:    Risk Reduction Factors:    Total Time spent with patient: 30 minutes Principal Problem: Bipolar disorder, curr episode mixed, severe, w/o psychotic features Diagnosis:   Patient Active Problem List   Diagnosis Date Noted  . Bipolar disorder, curr episode mixed, severe, w/o psychotic features [F31.63] 07/11/2014  . 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 Sinusitis, acute [J01.90] 10/13/2013  . Overweight (BMI 25.0-29.9) [E66.3] 10/12/2013  . Yeast vaginitis [B37.3] 02/04/2013  . Other malaise and fatigue [R53.81, R53.83] 11/06/2012  . Low back pain [M54.5] 11/06/2012  . Menstrual cramp [N94.6] 03/24/2012  . Asthma [493] 06/28/2010  . ADHD [F90.9] 06/13/2007     Continued Clinical Symptoms:  Alcohol Use Disorder Identification Test Final Score (AUDIT): 0 The "Alcohol Use Disorders Identification Test", Guidelines for Use in Primary Care, Second Edition.  World Science writer [facility]). Score between 0-7:  no or low risk or alcohol related problems. Score between 8-15:  moderate risk of alcohol related problems. Score between 16-19:  high risk of alcohol related problems. Score 20 or above:  warrants further diagnostic evaluation for alcohol dependence and treatment.   CLINICAL FACTORS:   Alcohol/Substance Abuse/Dependencies Unstable or Poor Therapeutic Relationship Previous Psychiatric Diagnoses and Treatments   Musculoskeletal: Strength & Muscle Tone: within normal limits Gait & Station: normal Patient leans: N/A  Psychiatric Specialty Exam: Physical Exam  Review of Systems  Psychiatric/Behavioral: Positive for depression, suicidal ideas and substance abuse. The patient is nervous/anxious and has insomnia.   All other systems reviewed and are negative.   Blood pressure 105/67, pulse 66,  temperature 97.4 F (36.3 C), temperature source Oral, resp. rate 20, height 5\' 5"  (1.651 m), weight 64.864 kg (143 lb), SpO2 100 %.Body mass index is 23.8 kg/(m^2).                Please see H&P.                                           COGNITIVE FEATURES THAT CONTRIBUTE TO RISK:  Closed-mindedness, Polarized thinking and Thought constriction (tunnel vision)    SUICIDE RISK:   Moderate:  Frequent suicidal ideation with limited intensity, and duration, some specificity in terms of plans, no associated intent, good self-control, limited dysphoria/symptomatology, some risk factors present, and identifiable protective factors, including available and accessible social support.  PLAN OF CARE: Please see H&P.    Medical Decision Making:  Review of Psycho-Social Stressors (1), Review or order clinical lab tests (1), Review and summation of old records (2), Order AIMS Test (2), Established Problem, Worsening (2), Review of Last Therapy Session (1), Review or order medicine tests (1), Independent Review of image, tracing or specimen (2), Review of Medication Regimen & Side Effects (2) and Review of New Medication or Change in Dosage (2)  I certify that inpatient services furnished can reasonably be expected to improve the patient's condition.   Jalexa Pifer MD 07/11/2014, 1:48 PM
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
## 2014-07-11 NOTE — Progress Notes (Signed)
Adult Psychoeducational Group Note  Date:  07/11/2014 Time:  9:18 PM  Group Topic/Focus:  Wrap-Up Group:   The focus of this group is to help patients review their daily goal of treatment and discuss progress on daily workbooks.  Participation Level:  Active  Participation Quality:  Appropriate  Affect:  Appropriate  Cognitive:  Appropriate  Insight: Appropriate  Engagement in Group:  Engaged  Modes of Intervention:  Discussion  Additional Comments:  The patient expressed that she attended Music Therapy.The patient also said that music put her in a mood.  Octavio Manns 07/11/2014, 9:18 PM

## 2014-07-12 MED ORDER — CLONAZEPAM 0.5 MG PO TABS
0.5000 mg | ORAL_TABLET | Freq: Two times a day (BID) | ORAL | Status: DC | PRN
  Administered 2014-07-13: 0.5 mg via ORAL
  Filled 2014-07-12: qty 1

## 2014-07-12 MED ORDER — DIPHENHYDRAMINE HCL 25 MG PO CAPS
25.0000 mg | ORAL_CAPSULE | Freq: Four times a day (QID) | ORAL | Status: DC | PRN
  Administered 2014-07-12 – 2014-07-14 (×5): 25 mg via ORAL
  Filled 2014-07-12 (×5): qty 1

## 2014-07-12 MED ORDER — MAGNESIUM CITRATE PO SOLN
1.0000 | Freq: Once | ORAL | Status: AC
  Administered 2014-07-12: 1 via ORAL

## 2014-07-12 MED ORDER — LITHIUM CARBONATE 300 MG PO CAPS
300.0000 mg | ORAL_CAPSULE | Freq: Three times a day (TID) | ORAL | Status: DC
  Administered 2014-07-13 – 2014-07-16 (×10): 300 mg via ORAL
  Filled 2014-07-12 (×13): qty 1

## 2014-07-12 MED ORDER — ZIPRASIDONE HCL 40 MG PO CAPS
40.0000 mg | ORAL_CAPSULE | Freq: Two times a day (BID) | ORAL | Status: DC
  Administered 2014-07-13 – 2014-07-14 (×3): 40 mg via ORAL
  Filled 2014-07-12 (×5): qty 1

## 2014-07-12 NOTE — [facility] Group Notes (Signed)
[facility] LCSW Group Therapy  07/12/2014 1:15 pm  Type of Therapy: Process Group Therapy  Participation Level:  Invited.  Chose to not attend  Summary of Progress/Problems: Today's group addressed the issue of overcoming obstacles.  Patients were asked to identify their biggest obstacle post d/c that stands in the way of their on-going success, and then problem solve as to how to manage this.  Ida Rogue 07/12/2014   2:32 PM

## 2014-07-12 NOTE — Progress Notes (Signed)
D: Pt visible in milieu for majority of this shift. Observed pacing at intervals and restless. Presents with congruent affect and anxious mood on approach. Pt rates her depression 8/10, hopelessness 7/10 and anxiety level 10/10 on self inventory sheet. Pt denied SI, HI and AVH when assessed, stated "not right now". Contracts for safety. Pt did not attend scheduled groups despite multiple prompts from staff. Off unit to courtyard for leisure with peers, returned without behavioral issues. New order for Benadryl 25 mg PO Q 6 hrs was added to pt's regimen in conjunction with hydrocortisone cream for itching related to rash on bilateral arms and legs. Pt is aware of the change and is agreement. A: Verbal education done on medications prior to administration. Encouragement and support provided to pt towards treatment compliance (groups and meds). Availability offered. Q 15 minutes checks continues for safety as ordered.  R:Pt denied adverse drug reaction when assessed. Pt stated her goal for today "getting myself stable" which she plans to attend by "taking her medications and working with her doctor and nurses". Voice no concerns at this time. Will monitor pt for safety. Continue POC.

## 2014-07-12 NOTE — Progress Notes (Signed)
D: Patient seen pacing the hall way requesting sleeping pill @ 19:40. Patient made aware it's rather too early for a sleeping pill. Denies pain, SI, AH/VH at this time. Offered and accepted Prn of zyprexa 5 mg for anxiety. Medication was effective. No further complaint.   A: support and encouragement offered. Medications given as ordered. Every 15 minutes check for safety maintained. Will continue to monitor patient. R: Patient receptive to nursing interventions.

## 2014-07-12 NOTE — Tx Team (Signed)
Interdisciplinary Treatment Plan Update (Adult)  Date:  07/12/2014   Time Reviewed:  8:22 AM   Progress in Treatment: Attending groups: Yes. Participating in groups:  Yes. Taking medication as prescribed:  Yes. Tolerating medication:  Yes. 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 Medical problems stabilized or resolved:  Yes. Denies suicidal/homicidal ideation: Yes. Issues/concerns per patient self-inventory:  No. 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  Asking for referral to rehab  Reason for Continuation of Hospitalization: Depression Medication stabilization Withdrawal symptoms  Comments:  [name] is a 34 y.o. female. Pt presents voluntarily to [facility] for an assessment accompanied by her ex partner and father of her two children (2 & 7), [name]. Pt is cooperative and oriented x 4. Her affect is anxious. She is very tearful. She sits with legs crossed while moving hands up and down her arms and her lap. She occasionally rocks. Pt says, "I don't feel like a human being anymore, and I just want to die." She also sts, "My brain feels like mush." Pt says she is experiencing "psychosis". She reports she is hearing things and seeing shadows move which are not really there. Pt endorses SI with no plan. Per chart review, pt was admitted to [facility] April 2016 after [facility] placed her under IVC. Pt sts, "I need help." Pt reports paranoia. She sts she has an ACTT team with [facility] and sees Dr Clyde Canterbury for med management. Pt sts she stopped eating for a long time but has been eating lately. She reports she sleeps approx 6 hrs or none at all. Pt reports she hasn't been taking Vyvanse as  directed. 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  Estimated length of stay: 4-5 days  New goal(s):  Review of initial/current patient goals per problem list:     Attendees: Patient:  07/12/2014 8:22 AM   Family:   07/12/2014 8:22 AM   Physician:  Jomarie Longs, MD 07/12/2014 8:22 AM   Nursing:   Marzetta Board, RN 07/12/2014 8:22 AM   CSW:    Daryel Gerald, LCSW   07/12/2014 8:22 AM   Other:  07/12/2014 8:22 AM   Other:   07/12/2014 8:22 AM   Other:  Onnie Boer, Nurse CM 07/12/2014 8:22 AM   Other:  Leisa Lenz, [facility] 07/12/2014 8:22 AM   Other:  Tomasita Morrow, P4CC  07/12/2014 8:22 AM   Other:  07/12/2014 8:22 AM   Other:  07/12/2014 8:22 AM   Other:  07/12/2014 8:22 AM   Other:  07/12/2014 8:22 AM   Other:  07/12/2014 8:22 AM   Other:   07/12/2014 8:22 AM    Scribe for Treatment Team:   Ida Rogue, 07/12/2014 8:22 AM

## 2014-07-12 NOTE — Plan of Care (Signed)
Problem: Alteration in mood; excessive anxiety as evidenced by: Goal: STG-Pt will report an absence of self-harm thoughts/actions (Patient will report an absence of self-harm thoughts or actions)  Outcome: Progressing Pt denied thoughts of self harm / SI when assessed. Pt has not made gestures or attempts to harm self thus far this shift.

## 2014-07-12 NOTE — Progress Notes (Signed)
[facility] MD Progress Note  07/12/2014 6:34 PM [name]  MRN:  [id number] Subjective:  [name] states she was abusing the Vyvance. She states she has been on one sort of stimulant or anther since she was a child. She is diagnosed with ADHD. She states she was trying to get high on the Vyvanse. She states she still feels very agitated. She asked to have some Klonopin  available to her as last time she was here. She does not plan to get back on stimulants Principal Problem: Bipolar disorder, curr episode mixed, severe, w/o psychotic features Diagnosis:   Patient Active Problem List   Diagnosis Date Noted  . Bipolar disorder, curr episode mixed, severe, w/o psychotic features [F31.63] 07/11/2014  . Cannabis use disorder, moderate, dependence [F12.20] 07/11/2014  . 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 Sinusitis, acute [J01.90] 10/13/2013  . Overweight (BMI 25.0-29.9) [E66.3] 10/12/2013  . Yeast vaginitis [B37.3] 02/04/2013  . Other malaise and fatigue [R53.81, R53.83] 11/06/2012  . Low back pain [M54.5] 11/06/2012  . Menstrual cramp [N94.6] 03/24/2012  . Asthma [493] 06/28/2010  . ADHD [F90.9] 06/13/2007   Total Time spent with patient: 30 minutes   Past Medical History:  Past Medical History  Diagnosis Date  . Abnormal Pap smear   . Headache(784.0)   . Anemia   . Anxiety   . PPH (postpartum hemorrhage)   . Asthma   . Blood transfusion     Pt delivery in 2009  . Complication of anesthesia   . Spinal headache    History reviewed. No pertinent past surgical history. Family History:  Family History  Problem Relation Age of Onset  . Thrombophlebitis Mother   . Hypertension Father   . Heart disease Maternal Grandmother   . Heart disease Paternal Grandfather   . Hypertension Paternal Grandfather    Social History:  History  Alcohol Use No     History  Drug Use  . Yes  . Special: Marijuana, Amphetamines    History   Social History  . Marital Status: Single     Spouse Name: N/A  . Number of Children: N/A  . Years of Education: N/A   Social History Main Topics  . Smoking status: Former Games developer  . Smokeless tobacco: Never Used  . Alcohol Use: No  . Drug Use: Yes    Special: Marijuana, Amphetamines  . Sexual Activity: Not Currently     Comment: Pt currently pregnant   Other Topics Concern  . None   Social History Narrative   Additional History:    Sleep: Fair  Appetite:  Fair   Assessment:   Musculoskeletal: Strength & Muscle Tone: within normal limits Gait & Station: normal Patient leans: N/A   Psychiatric Specialty Exam: Physical Exam  Review of Systems  Constitutional: Negative.   HENT: Negative.   Eyes: Negative.   Respiratory: Negative.   Cardiovascular: Negative.   Gastrointestinal: Negative.   Genitourinary: Negative.   Musculoskeletal: Negative.   Skin: Negative.   Neurological: Negative.   Endo/Heme/Allergies: Negative.   Psychiatric/Behavioral: Positive for depression and substance abuse. The patient is nervous/anxious.     Blood pressure 117/73, pulse 88, temperature 98.4 F (36.9 C), temperature source Oral, resp. rate 16, height 5\' 5"  (1.651 m), weight 64.864 kg (143 lb), SpO2 100 %.Body mass index is 23.8 kg/(m^2).  General Appearance: Fairly Groomed  Patent attorney::  Fair  Speech:  Clear and Coherent  Volume:  fluctuates  Mood:  Anxious and Dysphoric  Affect:  anxious worried  Thought Process:  Coherent and Goal Directed  Orientation:  Full (Time, Place, and Person)  Thought Content:  symptoms events worries concerns  Suicidal Thoughts:  No  Homicidal Thoughts:  No  Memory:  Immediate;   Fair Recent;   Fair Remote;   Fair  Judgement:  Fair  Insight:  Present and Shallow  Psychomotor Activity:  Restlessness  Concentration:  Fair  Recall:  [employer] of Knowledge:Fair  Language: Fair  Akathisia:  No  Handed:  Right  AIMS (if indicated):     Assets:  Desire for Improvement  ADL's:  Intact   Cognition: WNL  Sleep:  Number of Hours: 6.75     Current Medications: Current Facility-Administered Medications  Medication Dose Route Frequency Provider Last Rate Last Dose  . acetaminophen (TYLENOL) tablet 650 mg  650 mg Oral Q6H PRN Kristeen Mans, NP      . alum & mag hydroxide-simeth (MAALOX/MYLANTA) 200-200-20 MG/5ML suspension 30 mL  30 mL Oral Q4H PRN Kristeen Mans, NP   30 mL at 07/11/14 1715  . diphenhydrAMINE (BENADRYL) capsule 25 mg  25 mg Oral Q6H PRN Sanjuana Kava, NP   25 mg at 07/12/14 1202  . gabapentin (NEURONTIN) capsule 100 mg  100 mg Oral TID Jomarie Longs, MD   100 mg at 07/12/14 1700  . hydrocortisone cream 0.5 %   Topical TID Jomarie Longs, MD      . lithium carbonate capsule 300 mg  300 mg Oral BID WC Saramma Eappen, MD   300 mg at 07/12/14 1700  . nicotine polacrilex (NICORETTE) gum 2 mg  2 mg Oral PRN Jomarie Longs, MD   2 mg at 07/12/14 1816  . OLANZapine zydis (ZYPREXA) disintegrating tablet 5 mg  5 mg Oral Q6H PRN Jomarie Longs, MD   5 mg at 07/12/14 1703  . traZODone (DESYREL) tablet 100 mg  100 mg Oral QHS Kristeen Mans, NP      . ziprasidone (GEODON) capsule 20 mg  20 mg Oral BID WC Kristeen Mans, NP   20 mg at 07/12/14 1700    Lab Results: No results found for this or any previous visit (from the past 48 hour(s)).  Physical Findings: AIMS: Facial and Oral Movements Muscles of Facial Expression: None, normal Lips and Perioral Area: None, normal Jaw: None, normal Tongue: None, normal,Extremity Movements Upper (arms, wrists, hands, fingers): None, normal Lower (legs, knees, ankles, toes): None, normal, Trunk Movements Neck, shoulders, hips: None, normal, Overall Severity Severity of abnormal movements (highest score from questions above): None, normal Incapacitation due to abnormal movements: None, normal Patient's awareness of abnormal movements (rate only patient's report): No Awareness, Dental Status Current problems with teeth and/or  dentures?: No Does patient usually wear dentures?: No  CIWA:    COWS:     Treatment Plan Summary: Daily contact with patient to assess and evaluate symptoms and progress in treatment and Medication management Supportive approach/coping skills Stimulant abuse; will use klonopin 0.5 mg BID  PRN for the acute anxiety-agitation coming off the stimulants Will work a relapse prevention plan Mood instability; will optimize response to the Lithium and the Geodon Will increase the Lithium to 300 mg TID and the Geodon to 40 mg BID Medical Decision Making:  Review of Psycho-Social Stressors (1), Review or order clinical lab tests (1), Review of Medication Regimen & Side Effects (2) and Review of New Medication or Change in Dosage (2)     [name] A  07/12/2014, 6:34 PM

## 2014-07-12 NOTE — Progress Notes (Signed)
Adult Psychoeducational Group Note  Date:  07/12/2014 Time:  10:27 PM  Group Topic/Focus:  Wrap-Up Group:   The focus of this group is to help patients review their daily goal of treatment and discuss progress on daily workbooks.  Participation Level:  Minimal  Participation Quality:  Appropriate  Affect:  Excited  Cognitive:  Appropriate  Insight: Limited  Engagement in Group:  Limited  Modes of Intervention:  Socialization and Support  Additional Comments:  Patient attended and participated in group tonight. She reports that today was OK, it was better than yesterday. She advised that the medication they gave her today made her sleepy. She talked to the Child psychotherapist about her continuning rehab when she leave here. She reports that she did went for her meals and attended her groups.  Lita Mains [facility] 07/12/2014, 10:27 PM

## 2014-07-12 NOTE — [facility] Group Notes (Signed)
[facility] LCSW Aftercare Discharge Planning Group Note   07/12/2014 11:15 AM  Participation Quality:  Engaged  Mood/Affect:  Appropriate  Depression Rating:    Anxiety Rating:    Thoughts of Suicide:  No Will you contract for safety?   NA  Current AVH:  No  Plan for Discharge/Comments:  "[name] got me back here because he knew I needed help.  I was using adderal again, and I need to get back on track.  I don't have much support because both my parents are substance abusers as well."  After group pt signed release for [facility], [facility].  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  [employer]:   Supports:  [place], [place]

## 2014-07-12 NOTE — Progress Notes (Signed)
CSW attempted to contact Pt's ex-boyfriend, [name], however was unable to leave a voicemail because it was full. CSW will continue to attempt to contact [name] for collateral.   Referrals to [facility] and [facility] were made. The following documents were faxed: demographics, H&P, labs, vital signs, and medication list.  Chad Cordial, LCSWA [phone]

## 2014-07-13 ENCOUNTER — Other Ambulatory Visit: Payer: Self-pay

## 2014-07-13 MED ORDER — PREDNISONE 50 MG PO TABS
50.0000 mg | ORAL_TABLET | Freq: Every day | ORAL | Status: AC
  Administered 2014-07-14: 50 mg via ORAL
  Filled 2014-07-13: qty 1

## 2014-07-13 MED ORDER — PREDNISONE 10 MG PO TABS
10.0000 mg | ORAL_TABLET | Freq: Every day | ORAL | Status: DC
  Filled 2014-07-13: qty 1

## 2014-07-13 MED ORDER — PREDNISONE 20 MG PO TABS
40.0000 mg | ORAL_TABLET | Freq: Every day | ORAL | Status: AC
  Administered 2014-07-15: 40 mg via ORAL
  Filled 2014-07-13: qty 2

## 2014-07-13 MED ORDER — PREDNISONE 20 MG PO TABS
20.0000 mg | ORAL_TABLET | Freq: Every day | ORAL | Status: DC
  Filled 2014-07-13: qty 1

## 2014-07-13 MED ORDER — PREDNISONE 20 MG PO TABS
60.0000 mg | ORAL_TABLET | Freq: Once | ORAL | Status: AC
  Administered 2014-07-13: 60 mg via ORAL
  Filled 2014-07-13: qty 3

## 2014-07-13 MED ORDER — ZOLPIDEM TARTRATE 5 MG PO TABS
5.0000 mg | ORAL_TABLET | Freq: Every evening | ORAL | Status: DC | PRN
  Administered 2014-07-13 – 2014-07-14 (×2): 5 mg via ORAL
  Filled 2014-07-13 (×2): qty 1

## 2014-07-13 MED ORDER — PREDNISONE 20 MG PO TABS
30.0000 mg | ORAL_TABLET | Freq: Every day | ORAL | Status: AC
  Administered 2014-07-16: 30 mg via ORAL
  Filled 2014-07-13: qty 1

## 2014-07-13 MED ORDER — HYDROCORTISONE 1 % EX CREA
TOPICAL_CREAM | Freq: Three times a day (TID) | CUTANEOUS | Status: DC
  Administered 2014-07-13 – 2014-07-16 (×10): via TOPICAL
  Filled 2014-07-13 (×2): qty 28

## 2014-07-13 MED ORDER — PREDNISONE 10 MG PO TABS
10.0000 mg | ORAL_TABLET | Freq: Three times a day (TID) | ORAL | Status: DC
  Administered 2014-07-13: 10 mg via ORAL
  Filled 2014-07-13 (×6): qty 1

## 2014-07-13 NOTE — Progress Notes (Signed)
Adult Psychoeducational Group Note  Date:  07/13/2014 Time:  0900  Group Topic/Focus:  Recovery Goals:   The focus of this group is to identify appropriate goals for recovery and establish a plan to achieve them.  Participation Level:  Active  Participation Quality:  Appropriate  Affect:  Appropriate  Cognitive:  Appropriate  Insight: Appropriate  Engagement in Group:  Engaged  Modes of Intervention:  Education  Additional Comments:    Kian Gamarra L 07/13/2014, 11:01 AM

## 2014-07-13 NOTE — Plan of Care (Signed)
Problem: Diagnosis: Increased Risk For Suicide Attempt Goal: STG-Patient Will Attend All Groups On The Unit Outcome: Progressing Pt attended even wrap up group. Pt was engaged

## 2014-07-13 NOTE — Progress Notes (Signed)
D: Patient in room on approach. Pt reports her day was well. Pt stated goal is to go to a long term rehab center and to be a better mother for her kids. Pt stated not having any support at home except the father of her children. Pt mood and affect is appropriate to situation. Pt denies SI/HI and pain. Pt attended evening wrap up group and Interacted appropriately with peers. Cooperative with assessment.  A: Met with pt 1:1. Medications administered as prescribed. Writer encouraged pt to discuss feelings. Pt encouraged to come to staff with any question or concerns.  R: Patient remains safe. She is complaint with medications and denies any adverse reaction. Continue current POC.  

## 2014-07-13 NOTE — [facility] Suicide Risk Assessment (Signed)
[facility] INPATIENT:  Family/Significant Other Suicide Prevention Education  Suicide Prevention Education:  Contact Attempts: [name], Pt's ex-boyfriend, has been identified by the patient as the family member/significant other with whom the patient will be residing, and identified as the person(s) who will aid the patient in the event of a mental health crisis.  With written consent from the patient, two attempts were made to provide suicide prevention education, prior to and/or following the patient's discharge.  We were unsuccessful in providing suicide prevention education.  A suicide education pamphlet was given to the patient to share with family/significant other.  Date and time of first attempt:07/12/14 at 11:00am Date and time of second attempt: 07/13/14 at 10:30am  Elaina Hoops 07/13/2014, 4:02 PM

## 2014-07-13 NOTE — Progress Notes (Signed)
[facility] MD Progress Note  07/13/2014 2:23 PM [name]  MRN:  [id number] Subjective:  Patient states " I need some thing for sleep. I also have these rashes all over, its also down there around my vagina.'  Objectives:Patient seen and chart reviewed.Discussed patient with treatment team. Pt is a 20 y old [name], who has a hx of bipolar do , presented very tearful , labile . Pt today with more stable mood, than on admission. She continues to have pressured speech, as well as anxiety sx, sleep issues. Pt to be started on ambien for sleep, her Dierdre Searles is now at 900 mg, Li level on 6/17. Pt today also with rashes all over , which has been spreading since admission- pt reports she started having it after she cleaned up the yard- ? Poison ivy. Discussed starting prednisone oral for the same , she is already on hydrocortisone cream topical. Per staff- pt has concerns of the spreading rash - will address it today.Pt also reports wanting to get help with her substance abuse problems - reports abusing adderal, cannabis a lot prior to admission.    Principal Problem: Bipolar disorder, curr episode mixed, severe, w/o psychotic features Diagnosis:   Patient Active Problem List   Diagnosis Date Noted  . Bipolar disorder, curr episode mixed, severe, w/o psychotic features [F31.63] 07/11/2014  . Cannabis use disorder, moderate, dependence [F12.20] 07/11/2014  . 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 Sinusitis, acute [J01.90] 10/13/2013  . Overweight (BMI 25.0-29.9) [E66.3] 10/12/2013  . Yeast vaginitis [B37.3] 02/04/2013  . Other malaise and fatigue [R53.81, R53.83] 11/06/2012  . Low back pain [M54.5] 11/06/2012  . Menstrual cramp [N94.6] 03/24/2012  . Asthma [493] 06/28/2010  . ADHD [F90.9] 06/13/2007   Total Time spent with patient: 30 minutes   Past Medical History:  Past Medical History  Diagnosis Date  . Abnormal Pap smear   . Headache(784.0)   . Anemia   . Anxiety   . PPH (postpartum  hemorrhage)   . Asthma   . Blood transfusion     Pt delivery in 2009  . Complication of anesthesia   . Spinal headache    History reviewed. No pertinent past surgical history. Family History:  Family History  Problem Relation Age of Onset  . Thrombophlebitis Mother   . Hypertension Father   . Heart disease Maternal Grandmother   . Heart disease Paternal Grandfather   . Hypertension Paternal Grandfather    Social History:  History  Alcohol Use No     History  Drug Use  . Yes  . Special: Marijuana, Amphetamines    History   Social History  . Marital Status: Single    Spouse Name: N/A  . Number of Children: N/A  . Years of Education: N/A   Social History Main Topics  . Smoking status: Former Games developer  . Smokeless tobacco: Never Used  . Alcohol Use: No  . Drug Use: Yes    Special: Marijuana, Amphetamines  . Sexual Activity: Not Currently     Comment: Pt currently pregnant   Other Topics Concern  . None   Social History Narrative   Additional History:    Sleep: Fair  Appetite:  Fair     Musculoskeletal: Strength & Muscle Tone: within normal limits Gait & Station: normal Patient leans: N/A   Psychiatric Specialty Exam: Physical Exam  Review of Systems  Constitutional: Negative.   HENT: Negative.   Eyes: Negative.   Respiratory: Negative.   Cardiovascular: Negative.  Gastrointestinal: Negative.   Genitourinary: Negative.   Musculoskeletal: Negative.   Skin: Positive for itching and rash (maculopapular rashes all over her chest, upper extremitites, genital area).  Neurological: Negative.   Endo/Heme/Allergies: Negative.   Psychiatric/Behavioral: Positive for depression and substance abuse. The patient is nervous/anxious.   All other systems reviewed and are negative.   Blood pressure 100/68, pulse 70, temperature 98.1 F (36.7 C), temperature source Oral, resp. rate 20, height  (1.651 m), weight 64.864 kg (143 lb), SpO2 100 %.Body mass index  is 23.8 kg/(m^2).  General Appearance: Fairly Groomed  Patent attorney::  Fair  Speech:  Pressured  Volume:  fluctuates  Mood:  Anxious  Affect:  Labile  Thought Process:  Linear  Orientation:  Full (Time, Place, and Person)  Thought Content:  Rumination  Suicidal Thoughts:  No  Homicidal Thoughts:  No  Memory:  Immediate;   Fair Recent;   Fair Remote;   Fair  Judgement:  Fair  Insight:  Present and Shallow  Psychomotor Activity:  Restlessness  Concentration:  Fair  Recall:  [employer] of Knowledge:Fair  Language: Fair  Akathisia:  No  Handed:  Right  AIMS (if indicated):     Assets:  Desire for Improvement  ADL's:  Intact  Cognition: WNL  Sleep:  Number of Hours: 6.75     Current Medications: Current Facility-Administered Medications  Medication Dose Route Frequency Provider Last Rate Last Dose  . acetaminophen (TYLENOL) tablet 650 mg  650 mg Oral Q6H PRN Kristeen Mans, NP      . alum & mag hydroxide-simeth (MAALOX/MYLANTA) 200-200-20 MG/5ML suspension 30 mL  30 mL Oral Q4H PRN Kristeen Mans, NP   30 mL at 07/11/14 1715  . clonazePAM (KLONOPIN) tablet 0.5 mg  0.5 mg Oral BID PRN Rachael Fee, MD      . diphenhydrAMINE (BENADRYL) capsule 25 mg  25 mg Oral Q6H PRN Sanjuana Kava, NP   25 mg at 07/13/14 1416  . gabapentin (NEURONTIN) capsule 100 mg  100 mg Oral TID Jomarie Longs, MD   100 mg at 07/13/14 1216  . hydrocortisone cream 1 %   Topical TID Jomarie Longs, MD      . lithium carbonate capsule 300 mg  300 mg Oral TID WC Rachael Fee, MD   300 mg at 07/13/14 1216  . nicotine polacrilex (NICORETTE) gum 2 mg  2 mg Oral PRN Jomarie Longs, MD   2 mg at 07/13/14 1416  . OLANZapine zydis (ZYPREXA) disintegrating tablet 5 mg  5 mg Oral Q6H PRN Jomarie Longs, MD   5 mg at 07/13/14 0802  . predniSONE (DELTASONE) tablet 60 mg  60 mg Oral Once Jomarie Longs, MD       Followed by  . [START ON 07/14/2014] predniSONE (DELTASONE) tablet 50 mg  50 mg Oral Q breakfast Jomarie Longs,  MD       Followed by  . [START ON 07/15/2014] predniSONE (DELTASONE) tablet 40 mg  40 mg Oral Q breakfast Jomarie Longs, MD       Followed by  . [START ON 07/16/2014] predniSONE (DELTASONE) tablet 30 mg  30 mg Oral Q breakfast Jomarie Longs, MD       Followed by  . [START ON 07/17/2014] predniSONE (DELTASONE) tablet 20 mg  20 mg Oral Q breakfast Jomarie Longs, MD       Followed by  . [START ON 07/18/2014] predniSONE (DELTASONE) tablet 10 mg  10 mg Oral Q  breakfast Jomarie Longs, MD      . ziprasidone (GEODON) capsule 40 mg  40 mg Oral BID WC Rachael Fee, MD   40 mg at 07/13/14 0802  . zolpidem (AMBIEN) tablet 5 mg  5 mg Oral QHS PRN,MR X 1 Breccan Galant, MD        Lab Results: No results found for this or any previous visit (from the past 48 hour(s)).  Physical Findings: AIMS: Facial and Oral Movements Muscles of Facial Expression: None, normal Lips and Perioral Area: None, normal Jaw: None, normal Tongue: None, normal,Extremity Movements Upper (arms, wrists, hands, fingers): None, normal Lower (legs, knees, ankles, toes): None, normal, Trunk Movements Neck, shoulders, hips: None, normal, Overall Severity Severity of abnormal movements (highest score from questions above): None, normal Incapacitation due to abnormal movements: None, normal Patient's awareness of abnormal movements (rate only patient's report): No Awareness, Dental Status Current problems with teeth and/or dentures?: No Does patient usually wear dentures?: No  CIWA:    COWS:     Assessment: Patient is a 54 y old [name] who has a hx of bipolar do , as well as polysubstance abuse - presented with mood lability, pt continues to improve. Has sleep issues, to be addressed with medication changes.    Treatment Plan Summary: Daily contact with patient to assess and evaluate symptoms and progress in treatment and Medication management   Will continue Geodon 40  mg po bid with meals - for bipolar disorder. Will continue  Lithium 300 mg po tid with meals for mood lability.Li level on 07/16/14. Will continue Vistaril 25 mg po q6h prn medications for anxiety/agitation. Will continue Gabapentin 100 mg po tid for anxiety sx. Will  DC Trazodone . Will start Ambien 5 mg po qhs for sleep, repeat x1 prn. Hydrocortisone cream 1 % for rashes.Add Prednsione 60 mg po daily today , to be tapered off slowly. Will continue to monitor vitals ,medication compliance and treatment side effects while patient is here.  Will monitor for medical issues as well as call consult as needed.  CSW will start working on disposition.  Patient to participate in therapeutic milieu .    Medical Decision Making:  Review of Psycho-Social Stressors (1), Review or order clinical lab tests (1), Review of Medication Regimen & Side Effects (2) and Review of New Medication or Change in Dosage (2)     Witt Plitt MD 07/13/2014, 2:23 PM

## 2014-07-13 NOTE — Progress Notes (Signed)
D: Pt presents anxious this morning. Pt fidgety and pacing hallway. Pt child-like and attention seeking. Pt frequently requesting anxiety medications this morning. Pt denies suicidal thoughts. Pt denies AVH. Pt rates depression 5/10. Hopeless 4/10. Anxiety 8/10. Pt c/o rash spreading from her arms to her leg and vagina. Elna Breslow, MD., made aware of pt complaint. Pt compliant with taking meds. No adverse reactions to meds verbalized by pt. A: Medications administered as ordered per MD. Pt started on prednisone for rash per MD. Verbal support given. Pt encouraged to attend groups. 15 minute checks performed for safety. R:Pt stated goal is to try and do something about poison oak. Pt receptive to treatment.

## 2014-07-13 NOTE — [facility] Group Notes (Signed)
[facility] LCSW Group Therapy  07/13/2014 1:15 pm  Type of Therapy: Process Group Therapy  Participation Level:  Active  Participation Quality:  Appropriate  Affect:  Flat  Cognitive:  Oriented  Insight:  Improving  Engagement in Group:  Limited  Engagement in Therapy:  Limited  Modes of Intervention:  Activity, Clarification, Education, Problem-solving and Support  Summary of Progress/Problems: Today's group addressed the issue of overcoming obstacles.  Patients were asked to identify their biggest obstacle post d/c that stands in the way of their on-going success, and then problem solve as to how to manage this.  "When people yell and treat me disrespectfully, I get stressed out."  Talked further about family and friends who at times do this.  Especially when they are being mean spirited. "So I guess I need to excuse myself and walk away and maybe run to decompress, and then come back, because maybe they really have something important they are trying to tell me.  I've also learned I have to pick my battles.  If I choose to fight over every little thing, it becomes too much."  Although patient was in and out several times, she was invested while there.  Daryel Gerald B 07/13/2014   10:47 AM

## 2014-07-13 NOTE — Plan of Care (Signed)
Problem: Alteration in mood Goal: STG-Patient is able to discuss feelings and issues (Patient is able to discuss feelings and issues leading to depression)  Outcome: Progressing Pt reports goal after discharge is to go to long term rehab.

## 2014-07-13 NOTE — Progress Notes (Signed)
D: Patient reports her day was well. Report she talked to treatment team and is hopeful she will get into [facility]. Pt mood and affect is appropriate to situation. Pt denies SI/HI/AVH and pain. Pt attended evening wrap up group and Interacted appropriately with peers. Cooperative with assessment.  A: Met with pt 1:1. Medications administered as prescribed. Writer encouraged pt to discuss feelings. Pt encouraged to come to staff with any question or concerns.  R: Patient remains safe. She is complaint with medications and denies any adverse reaction.

## 2014-07-14 MED ORDER — ZIPRASIDONE HCL 60 MG PO CAPS
60.0000 mg | ORAL_CAPSULE | Freq: Two times a day (BID) | ORAL | Status: DC
  Administered 2014-07-14 – 2014-07-16 (×4): 60 mg via ORAL
  Filled 2014-07-14 (×4): qty 1
  Filled 2014-07-14: qty 28
  Filled 2014-07-14: qty 1
  Filled 2014-07-14: qty 28
  Filled 2014-07-14: qty 1

## 2014-07-14 MED ORDER — TRAZODONE HCL 100 MG PO TABS
100.0000 mg | ORAL_TABLET | Freq: Every day | ORAL | Status: DC
  Administered 2014-07-14 – 2014-07-15 (×2): 100 mg via ORAL
  Filled 2014-07-14 (×2): qty 1
  Filled 2014-07-14: qty 14
  Filled 2014-07-14: qty 1

## 2014-07-14 MED ORDER — HYDROXYZINE HCL 25 MG PO TABS: 25.0000 mg | ORAL_TABLET | ORAL | Status: DC | PRN

## 2014-07-14 MED ORDER — HYDROXYZINE HCL 25 MG PO TABS
25.0000 mg | ORAL_TABLET | ORAL | Status: DC | PRN
  Administered 2014-07-14 – 2014-07-16 (×4): 25 mg via ORAL
  Filled 2014-07-14: qty 20
  Filled 2014-07-14 (×4): qty 1

## 2014-07-14 MED ORDER — GABAPENTIN 100 MG PO CAPS
200.0000 mg | ORAL_CAPSULE | ORAL | Status: DC
  Administered 2014-07-14 – 2014-07-16 (×6): 200 mg via ORAL
  Filled 2014-07-14 (×2): qty 2
  Filled 2014-07-14: qty 84
  Filled 2014-07-14 (×3): qty 2
  Filled 2014-07-14 (×2): qty 84
  Filled 2014-07-14 (×4): qty 2

## 2014-07-14 NOTE — [facility] Group Notes (Signed)
[facility] Group Therapy  07/14/2014 , 1:34 PM    Type of Therapy:  [facility] Presentation  Participation Level: Invited.  Chose to not attend  Summary of Progress/Problems:  Onalee Hua from [facility] [employer] [facility] came to present his recovery story and play the guitar.    Daryel Gerald B 07/14/2014 , 1:34 PM

## 2014-07-14 NOTE — Progress Notes (Signed)
D: Pt presents anxious this morning. Pt c/o not sleeping well last night and reported that the Ambien gives her a headache. Pt denies suicidal/homicidal thoughts. Pt denies depression. Pt denies AVH. Pt c/o rash this morning. Pt given scheduled meds for rash. Pt c/o anxiety throughout the day and requested prn med. Pt verbalized that she is seeking long-term substance treatment. Pt compliant with taking meds and attending groups. A: Medications administered as ordered per MD. Prn meds given at pt request. Pt encouraged to use coping skills and to not depend on prn meds for relief. Verbal support given. Pt encouraged to attend groups. 15 minute checks performed for safety.  R: Pt receptive to treatment.

## 2014-07-14 NOTE — Progress Notes (Signed)
[facility] MD Progress Note  07/14/2014 2:03 PM [name]  MRN:  [id number] Subjective:  Patient states " I am very anxious today , being on the steroid is making me nervous today.'    Objectives:Patient seen and chart reviewed.Discussed patient with treatment team. Pt is a 52 y old CF, who has a hx of bipolar do , presented very tearful , labile . Pt today appears to be labile , anxious , tearful. Pt reports that her anxiety could possibly be worse due to being on steroid , however wants to stay on it.  Pt reports decreased sleep last night , Remus Loffler is not working and its giving her a headache.Pt feels trazodone worked better. Discussed with pt about the risk of being on klonopin and the need to work on her coping skills. Pt agrees - states she will try to take vistaril. Pt is very motivated to get in to a substance abuse program. Per staff pt has been having sleep issues /anxiety.  Principal Problem: Bipolar disorder, curr episode mixed, severe, w/o psychotic features Diagnosis:   Patient Active Problem List   Diagnosis Date Noted  . Bipolar disorder, curr episode mixed, severe, w/o psychotic features [F31.63] 07/11/2014  . Cannabis use disorder, moderate, dependence [F12.20] 07/11/2014  . 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 Sinusitis, acute [J01.90] 10/13/2013  . Overweight (BMI 25.0-29.9) [E66.3] 10/12/2013  . Yeast vaginitis [B37.3] 02/04/2013  . Other malaise and fatigue [R53.81, R53.83] 11/06/2012  . Low back pain [M54.5] 11/06/2012  . Menstrual cramp [N94.6] 03/24/2012  . Asthma [493] 06/28/2010  . ADHD [F90.9] 06/13/2007   Total Time spent with patient: 30 minutes   Past Medical History:  Past Medical History  Diagnosis Date  . Abnormal Pap smear   . Headache(784.0)   . Anemia   . Anxiety   . PPH (postpartum hemorrhage)   . Asthma   . Blood transfusion     Pt delivery in 2009  . Complication of anesthesia   . Spinal headache    History reviewed. No  pertinent past surgical history. Family History:  Family History  Problem Relation Age of Onset  . Thrombophlebitis Mother   . Hypertension Father   . Heart disease Maternal Grandmother   . Heart disease Paternal Grandfather   . Hypertension Paternal Grandfather    Social History:  History  Alcohol Use No     History  Drug Use  . Yes  . Special: Marijuana, Amphetamines    History   Social History  . Marital Status: Single    Spouse Name: N/A  . Number of Children: N/A  . Years of Education: N/A   Social History Main Topics  . Smoking status: Former Games developer  . Smokeless tobacco: Never Used  . Alcohol Use: No  . Drug Use: Yes    Special: Marijuana, Amphetamines  . Sexual Activity: Not Currently     Comment: Pt currently pregnant   Other Topics Concern  . None   Social History Narrative   Additional History:    Sleep: Fair  Appetite:  Fair     Musculoskeletal: Strength & Muscle Tone: within normal limits Gait & Station: normal Patient leans: N/A   Psychiatric Specialty Exam: Physical Exam  Review of Systems  Constitutional: Negative.   Eyes: Negative.   Respiratory: Negative.   Cardiovascular: Negative.   Gastrointestinal: Negative.   Genitourinary: Negative.   Musculoskeletal: Negative.   Skin: Positive for itching and rash (maculopapular rashes all over her chest,  upper extremitites, genital area).  Neurological: Positive for headaches.  Endo/Heme/Allergies: Negative.   Psychiatric/Behavioral: Positive for depression and substance abuse. The patient is nervous/anxious.   All other systems reviewed and are negative.   Blood pressure 105/82, pulse 75, temperature 98.3 F (36.8 C), temperature source Oral, resp. rate 14, height 5\' 5"  (1.651 m), weight 64.864 kg (143 lb), SpO2 100 %.Body mass index is 23.8 kg/(m^2).  General Appearance: Fairly Groomed  Patent attorney::  Fair  Speech:  Pressured  Volume:  fluctuates  Mood:  Anxious  Affect:  Labile  and Tearful  Thought Process:  Linear  Orientation:  Full (Time, Place, and Person)  Thought Content:  Rumination  Suicidal Thoughts:  No  Homicidal Thoughts:  No  Memory:  Immediate;   Fair Recent;   Fair Remote;   Fair  Judgement:  Fair  Insight:  Present and Shallow  Psychomotor Activity:  Restlessness  Concentration:  Fair  Recall:  [employer] of Knowledge:Fair  Language: Fair  Akathisia:  No  Handed:  Right  AIMS (if indicated):     Assets:  Desire for Improvement  ADL's:  Intact  Cognition: WNL  Sleep:  Number of Hours: 6.75     Current Medications: Current Facility-Administered Medications  Medication Dose Route Frequency Provider Last Rate Last Dose  . acetaminophen (TYLENOL) tablet 650 mg  650 mg Oral Q6H PRN Kristeen Mans, NP   650 mg at 07/14/14 0558  . alum & mag hydroxide-simeth (MAALOX/MYLANTA) 200-200-20 MG/5ML suspension 30 mL  30 mL Oral Q4H PRN Kristeen Mans, NP   30 mL at 07/11/14 1715  . gabapentin (NEURONTIN) capsule 200 mg  200 mg Oral [id number] Jonnell Hentges, MD      . hydrocortisone cream 1 %   Topical TID Jomarie Longs, MD      . hydrOXYzine (ATARAX/VISTARIL) tablet 25 mg  25 mg Oral Q4H PRN Adamarie Izzo, MD      . lithium carbonate capsule 300 mg  300 mg Oral TID WC Rachael Fee, MD   300 mg at 07/14/14 1204  . nicotine polacrilex (NICORETTE) gum 2 mg  2 mg Oral PRN Jomarie Longs, MD   2 mg at 07/14/14 1040  . OLANZapine zydis (ZYPREXA) disintegrating tablet 5 mg  5 mg Oral Q6H PRN Jomarie Longs, MD   5 mg at 07/14/14 1039  . [START ON 07/15/2014] predniSONE (DELTASONE) tablet 40 mg  40 mg Oral Q breakfast Jomarie Longs, MD       Followed by  . [START ON 07/16/2014] predniSONE (DELTASONE) tablet 30 mg  30 mg Oral Q breakfast Jomarie Longs, MD       Followed by  . [START ON 07/17/2014] predniSONE (DELTASONE) tablet 20 mg  20 mg Oral Q breakfast Jomarie Longs, MD       Followed by  . [START ON 07/18/2014] predniSONE (DELTASONE) tablet 10 mg   10 mg Oral Q breakfast Stephan Draughn, MD      . traZODone (DESYREL) tablet 100 mg  100 mg Oral QHS Darnise Montag, MD      . ziprasidone (GEODON) capsule 60 mg  60 mg Oral BID WC Jomarie Longs, MD        Lab Results: No results found for this or any previous visit (from the past 48 hour(s)).  Physical Findings: AIMS: Facial and Oral Movements Muscles of Facial Expression: None, normal Lips and Perioral Area: None, normal Jaw: None, normal Tongue: None, normal,Extremity Movements Upper (arms, wrists,  hands, fingers): None, normal Lower (legs, knees, ankles, toes): None, normal, Trunk Movements Neck, shoulders, hips: None, normal, Overall Severity Severity of abnormal movements (highest score from questions above): None, normal Incapacitation due to abnormal movements: None, normal Patient's awareness of abnormal movements (rate only patient's report): No Awareness, Dental Status Current problems with teeth and/or dentures?: No Does patient usually wear dentures?: No  CIWA:    COWS:     Assessment: Patient is a 70 y old CF who has a hx of bipolar do , as well as polysubstance abuse - presented with mood lability, pt continues to be anxious , tearful and has sleep issues. Will make medication changes .    Treatment Plan Summary: Daily contact with patient to assess and evaluate symptoms and progress in treatment and Medication management   Will increase Geodon to 60 mg po bid with meals - for bipolar disorder. Will continue Lithium 300 mg po tid with meals for mood lability.Li level on 07/16/14. Will continue Vistaril 25 mg po q6h prn medications for anxiety/agitation. Will continue Gabapentin 100 mg po tid for anxiety sx. Will restart Trazodone 100 mg po qhs for sleep. Will DC Ambien.  Hydrocortisone cream 1 % for rashes.Add Prednsione 60 mg po daily today , to be tapered off slowly. Will continue to monitor vitals ,medication compliance and treatment side effects while patient is  here.  Will monitor for medical issues as well as call consult as needed.  CSW will start working on disposition. Pt to be referred to substance abuse program. Patient to participate in therapeutic milieu .    Medical Decision Making:  Review of Psycho-Social Stressors (1), Review or order clinical lab tests (1), Review of Medication Regimen & Side Effects (2) and Review of New Medication or Change in Dosage (2)     Darrin Koman MD 07/14/2014, 2:03 PM

## 2014-07-14 NOTE — [facility] Group Notes (Signed)
[facility] LCSW Aftercare Discharge Planning Group Note   07/14/2014 1:30 PM  Participation Quality:  Engaged  Mood/Affect:  Appropriate  Depression Rating:  denies  Anxiety Rating:  "High"  Thoughts of Suicide:  No Will you contract for safety?   NA  Current AVH:  No  Plan for Discharge/Comments:  Gave her an update on [facility]-still waiting to hear.  Talked about a plan B, which is outpt follow up at [facility].  Has no current place to stay.  We called her SO to give him an update, and he is hoping for rehab.  Called ACT team, who stated without income she has no options but shelters.  Pt is hoping for rehab, but is also open to the possibility of shelter.  Transportation Means: ACT  Supports:ACT  [place], [place] B

## 2014-07-14 NOTE — Progress Notes (Signed)
Adult Psychoeducational Group Note  Date:  07/14/2014 Time:  10:47 PM  Group Topic/Focus:  Wrap-Up Group:   The focus of this group is to help patients review their daily goal of treatment and discuss progress on daily workbooks.  Participation Level:  Minimal  Participation Quality:  Appropriate  Affect:  Appropriate  Cognitive:  Appropriate  Insight: Appropriate  Engagement in Group:  Engaged  Modes of Intervention:  Socialization and Support  Additional Comments:  Patient attended and participated in group tonight. She report having a good day. She went outside, went to her groups and went for her meals. She is excited about going to Rehab on Monday.  Lita Mains [facility] 07/14/2014, 10:47 PM

## 2014-07-15 MED ORDER — MAGNESIUM CITRATE PO SOLN
1.0000 | Freq: Once | ORAL | Status: AC
  Administered 2014-07-15: 1 via ORAL

## 2014-07-15 NOTE — Tx Team (Signed)
Interdisciplinary Treatment Plan Update (Adult)  Date:  07/15/2014   Time Reviewed:  3:12 PM   Progress in Treatment: Attending groups: Yes. Participating in groups:  Yes. Taking medication as prescribed:  Yes. Tolerating medication:  Yes. Family/Significant othe contact made:  Yes Patient understands diagnosis:  Yes  As evidenced by seeking help with Discussing patient identified problems/goals with staff:  Yes, see initial care plan. Medical problems stabilized or resolved:  Yes. Denies suicidal/homicidal ideation: Yes. Issues/concerns per patient self-inventory:  No. Other:  New problem(s) identified:  Discharge Plan or Barriers:  Stay with friend over the weekend; go to [facility] first thing Mon AM for screening for admission; continue to work with [facility] ACT team  Reason for Continuation of Hospitalization:  Comments:  Estimated length of stay: Likely d/c tomorrow, Sat. at the latest  New goal(s):  Review of initial/current patient goals per problem list:     Attendees: Patient:  07/15/2014 3:12 PM   Family:   07/15/2014 3:12 PM   Physician:  Jomarie Longs, MD 07/15/2014 3:12 PM   Nursing:   Quintella Reichert, RN 07/15/2014 3:12 PM   CSW:    Daryel Gerald, LCSW   07/15/2014 3:12 PM   Other:  07/15/2014 3:12 PM   Other:   07/15/2014 3:12 PM   Other:  Onnie Boer, Nurse CM 07/15/2014 3:12 PM   Other:  Leisa Lenz, [facility] 07/15/2014 3:12 PM   Other:  Tomasita Morrow, P4CC  07/15/2014 3:12 PM   Other:  07/15/2014 3:12 PM   Other:  07/15/2014 3:12 PM   Other:  07/15/2014 3:12 PM   Other:  07/15/2014 3:12 PM   Other:  07/15/2014 3:12 PM   Other:   07/15/2014 3:12 PM    Scribe for Treatment Team:   Ida Rogue, 07/15/2014 3:12 PM

## 2014-07-15 NOTE — Progress Notes (Signed)
[facility] MD Progress Note  07/15/2014 12:02 PM [name]  MRN:  [id number] Subjective:  Patient states " I am ok, my anxiety is a bit better today , now that I know I am going to be accepted at the treatment facility.'     Objectives:Patient seen and chart reviewed.Discussed patient with treatment team. Pt is a 64 y old CF, who has a hx of bipolar do , presented very tearful , labile . Pt today appears anxious , but not as tearful or labile as the previous days . Pt has been working on Pharmacologist , feels that medications are working. Pt slept last night with the Trazodone . Pt with multiple rashes all over her body - on prednisone taper - rashes seems to be improving. Pt reports Prednisone as contributing to her nervousness and restlessness - but would like to stay on it since she wants her rashes to be treated.Her rashes seems to be from [place]- per pt hx. Pt with hx of adderall , cannabis abuse, - pt continues to be motivated to get help with her   Per staff - pt has been compliant with medications - pt appears less anxious , continues to require prn medications on and off . Will observe.     Principal Problem: Bipolar disorder, curr episode mixed, severe, w/o psychotic features Diagnosis:   Patient Active Problem List   Diagnosis Date Noted  . Bipolar disorder, curr episode mixed, severe, w/o psychotic features [F31.63] 07/11/2014  . Cannabis use disorder, moderate, dependence [F12.20] 07/11/2014  . 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 Sinusitis, acute [J01.90] 10/13/2013  . Overweight (BMI 25.0-29.9) [E66.3] 10/12/2013  . Yeast vaginitis [B37.3] 02/04/2013  . Other malaise and fatigue [R53.81, R53.83] 11/06/2012  . Low back pain [M54.5] 11/06/2012  . Menstrual cramp [N94.6] 03/24/2012  . Asthma [493] 06/28/2010  . ADHD [F90.9] 06/13/2007   Total Time spent with patient: 30 minutes   Past Medical History:  Past Medical History  Diagnosis Date  . Abnormal  Pap smear   . Headache(784.0)   . Anemia   . Anxiety   . PPH (postpartum hemorrhage)   . Asthma   . Blood transfusion     Pt delivery in 2009  . Complication of anesthesia   . Spinal headache    History reviewed. No pertinent past surgical history. Family History:  Family History  Problem Relation Age of Onset  . Thrombophlebitis Mother   . Hypertension Father   . Heart disease Maternal Grandmother   . Heart disease Paternal Grandfather   . Hypertension Paternal Grandfather    Social History:  History  Alcohol Use No     History  Drug Use  . Yes  . Special: Marijuana, Amphetamines    History   Social History  . Marital Status: Single    Spouse Name: N/A  . Number of Children: N/A  . Years of Education: N/A   Social History Main Topics  . Smoking status: Former Games developer  . Smokeless tobacco: Never Used  . Alcohol Use: No  . Drug Use: Yes    Special: Marijuana, Amphetamines  . Sexual Activity: Not Currently     Comment: Pt currently pregnant   Other Topics Concern  . None   Social History Narrative   Additional History:    Sleep: Fair  Appetite:  Fair     Musculoskeletal: Strength & Muscle Tone: within normal limits Gait & Station: normal Patient leans: N/A   Psychiatric  Specialty Exam: Physical Exam  Review of Systems  Constitutional: Negative.   HENT: Negative.   Eyes: Negative.   Respiratory: Negative.   Cardiovascular: Negative.   Gastrointestinal: Negative.   Genitourinary: Negative.   Musculoskeletal: Negative.   Skin: Positive for itching and rash (maculopapular rashes all over her chest, upper extremitites, genital area, improving).  Endo/Heme/Allergies: Negative.   Psychiatric/Behavioral: Positive for depression and substance abuse. The patient is nervous/anxious.   All other systems reviewed and are negative.   Blood pressure 100/77, pulse 74, temperature 97.4 F (36.3 C), temperature source Oral, resp. rate 20, height   (1.651 m), weight 64.864 kg (143 lb), SpO2 100 %.Body mass index is 23.8 kg/(m^2).  General Appearance: Fairly Groomed  Patent attorney::  Fair  Speech:  Pressured improving  Volume:  fluctuates  Mood:  Anxious improving  Affect:  Labile  Thought Process:  Linear  Orientation:  Full (Time, Place, and Person)  Thought Content:  Rumination  Suicidal Thoughts:  No  Homicidal Thoughts:  No  Memory:  Immediate;   Fair Recent;   Fair Remote;   Fair  Judgement:  Fair  Insight:  Shallow  Psychomotor Activity:  Restlessness  Concentration:  Fair  Recall:  [employer] of Knowledge:Fair  Language: Fair  Akathisia:  No  Handed:  Right  AIMS (if indicated):     Assets:  Desire for Improvement  ADL's:  Intact  Cognition: WNL  Sleep:  Number of Hours: 6.75     Current Medications: Current Facility-Administered Medications  Medication Dose Route Frequency Provider Last Rate Last Dose  . acetaminophen (TYLENOL) tablet 650 mg  650 mg Oral Q6H PRN Kristeen Mans, NP   650 mg at 07/14/14 0558  . alum & mag hydroxide-simeth (MAALOX/MYLANTA) 200-200-20 MG/5ML suspension 30 mL  30 mL Oral Q4H PRN Kristeen Mans, NP   30 mL at 07/11/14 1715  . gabapentin (NEURONTIN) capsule 200 mg  200 mg Oral [id number] Phill Steck, MD   200 mg at 07/15/14 0827  . hydrocortisone cream 1 %   Topical TID Jomarie Longs, MD      . hydrOXYzine (ATARAX/VISTARIL) tablet 25 mg  25 mg Oral Q4H PRN Jomarie Longs, MD   25 mg at 07/14/14 1809  . lithium carbonate capsule 300 mg  300 mg Oral TID WC Rachael Fee, MD   300 mg at 07/15/14 1136  . nicotine polacrilex (NICORETTE) gum 2 mg  2 mg Oral PRN Jomarie Longs, MD   2 mg at 07/15/14 1138  . OLANZapine zydis (ZYPREXA) disintegrating tablet 5 mg  5 mg Oral Q6H PRN Jomarie Longs, MD   5 mg at 07/14/14 1039  . [START ON 07/16/2014] predniSONE (DELTASONE) tablet 30 mg  30 mg Oral Q breakfast Jomarie Longs, MD       Followed by  . [START ON 07/17/2014] predniSONE (DELTASONE)  tablet 20 mg  20 mg Oral Q breakfast Jomarie Longs, MD       Followed by  . [START ON 07/18/2014] predniSONE (DELTASONE) tablet 10 mg  10 mg Oral Q breakfast Monicia Tse, MD      . traZODone (DESYREL) tablet 100 mg  100 mg Oral QHS Jomarie Longs, MD   100 mg at 07/14/14 2106  . ziprasidone (GEODON) capsule 60 mg  60 mg Oral BID WC Jomarie Longs, MD   60 mg at 07/15/14 0454    Lab Results: No results found for this or any previous visit (from the past  48 hour(s)).  Physical Findings: AIMS: Facial and Oral Movements Muscles of Facial Expression: None, normal Lips and Perioral Area: None, normal Jaw: None, normal Tongue: None, normal,Extremity Movements Upper (arms, wrists, hands, fingers): None, normal Lower (legs, knees, ankles, toes): None, normal, Trunk Movements Neck, shoulders, hips: None, normal, Overall Severity Severity of abnormal movements (highest score from questions above): None, normal Incapacitation due to abnormal movements: None, normal Patient's awareness of abnormal movements (rate only patient's report): No Awareness, Dental Status Current problems with teeth and/or dentures?: No Does patient usually wear dentures?: No  CIWA:  CIWA-Ar Total: 1 COWS:  COWS Total Score: 1  Assessment: Patient is a 43 y old CF who has a hx of bipolar do , as well as polysubstance abuse - presented with mood lability, pt is less anxious today, tolerating her medications well. Will continue treatment.    Treatment Plan Summary: Daily contact with patient to assess and evaluate symptoms and progress in treatment and Medication management   Will continue Geodon  60 mg po bid with meals - for bipolar disorder. Will continue Lithium 300 mg po tid with meals for mood lability.Li level on 07/16/14. Will continue Vistaril 25 mg po q6h prn medications for anxiety/agitation. Will continue Gabapentin 100 mg po tid for anxiety sx. Will continueTrazodone 100 mg po qhs for sleep.   Hydrocortisone cream 1 % for rashes.Continue Prednsione taper. Will continue to monitor vitals ,medication compliance and treatment side effects while patient is here.  Will monitor for medical issues as well as call consult as needed.  CSW will start working on disposition. Pt to be referred to substance abuse program. Patient to participate in therapeutic milieu .    Medical Decision Making:  Review of Psycho-Social Stressors (1), Review of Last Therapy Session (1) and Review of Medication Regimen & Side Effects (2)     Mylasia Vorhees MD 07/15/2014, 12:02 PM

## 2014-07-15 NOTE — [facility] Group Notes (Signed)
The focus of this group is to educate the patient on the purpose and policies of crisis stabilization and provide a format to answer questions about their admission.  The group details unit policies and expectations of patients while admitted.  Patient did not attend 0900 nurse education orientation group this morning.  Patient was in her room.

## 2014-07-15 NOTE — Progress Notes (Signed)
D:  Patient went to dining room for dinner and tonight's recreation.  Denied SI and HI, contracts for safety.  Denied A/V hallucinations.  Denied pain. A:  Medications administered per MD orders.  Emotional support and encouragement given patient. R:  Safety maintained with 15 minute checks.

## 2014-07-15 NOTE — Progress Notes (Signed)
Pt attended evening karaoke group tonight. Pt did not sing, however did appear to enjoy group.

## 2014-07-15 NOTE — Progress Notes (Signed)
D:  Patient's self inventory sheet, patient sleeps good, sleep medication is helpful.  Good appetite, normal to high energy level, good concentration.  Rated depression and hopeless 3, anxiety 5-6.  Denied withdrawals.  Denied SI.  Physical problems of rash which has improved.  Denied SI.  Denied pain.  Goal is to "just to keep getting better and going to rehab Monday".  Plans to do "anything that helps me, being my own advocate".  Does have discharge plans.  No problems anticipated after discharge. A:  Medications administered per MD orders.  Emotional support and encouragement given patient. R:  Denied SI and HI, contracts for safety.  Denied A/V hallucinations.  Denied pain.  Safety maintained with 15 minute checks.

## 2014-07-15 NOTE — Plan of Care (Signed)
Problem: Consults Goal: Depression Patient Education See Patient Education Module for education specifics.  Outcome: Progressing Nurse discussed depression/coping skills with patient.        

## 2014-07-15 NOTE — Progress Notes (Signed)
D: Writer asked pt the circumstances surrounding her adm. Pt stated that "adderall wasn't good for her". Stated that she's not going to take it any longer. Pt stated she plans to go to [facility] on Monday. Stated that she feels the best thing for her is to stay at [facility] until Monday. Stated that her mother is available to take her.  A:  Support and encouragement was offered. 15 min checks continued for safety.  R: Pt remains safe.

## 2014-07-15 NOTE — [facility] Group Notes (Signed)
[facility] LCSW Group Therapy  07/15/2014 , 3:04 PM   Type of Therapy:  Group Therapy  Participation Level:  Active  Participation Quality:  Attentive  Affect:  Appropriate  Cognitive:  Alert  Insight:  Improving  Engagement in Therapy:  Engaged  Modes of Intervention:  Discussion, Exploration and Socialization  Summary of Progress/Problems: Today's group focused on the term Diagnosis.  Participants were asked to define the term, and then pronounce whether it is a negative, positive or neutral term.  Was engaged throughout and stayed the entire time.  We were talking about diagnosis as a label that results in stigma, and she shared her experience of this when she was not allowed to go to her mother's beauty salon because one of the women there told others about her bi-polar diagnosis.  "But you have to find a way to rise above.  I just told myself they were ignorant, and it bothered me less."  Demonstrated a nice example of positive self talk later, for which she got reinforcement.  Daryel Gerald B 07/15/2014 , 3:04 PM

## 2014-07-16 LAB — LITHIUM LEVEL: Lithium Lvl: 0.48 mmol/L — ABNORMAL LOW (ref 0.60–1.20)

## 2014-07-16 MED ORDER — HYDROCORTISONE 1 % EX CREA: TOPICAL_CREAM | Freq: Three times a day (TID) | CUTANEOUS | Status: DC

## 2014-07-16 MED ORDER — ZIPRASIDONE HCL 60 MG PO CAPS: 60.0000 mg | ORAL_CAPSULE | Freq: Two times a day (BID) | ORAL | Status: DC

## 2014-07-16 MED ORDER — ALBUTEROL SULFATE HFA 108 (90 BASE) MCG/ACT IN AERS: 1.0000 | INHALATION_SPRAY | RESPIRATORY_TRACT | Status: DC | PRN

## 2014-07-16 MED ORDER — GABAPENTIN 100 MG PO CAPS: 200.0000 mg | ORAL_CAPSULE | ORAL | Status: DC

## 2014-07-16 MED ORDER — LITHIUM CARBONATE 300 MG PO CAPS
600.0000 mg | ORAL_CAPSULE | Freq: Every day | ORAL | Status: DC
  Filled 2014-07-16 (×2): qty 2

## 2014-07-16 MED ORDER — LITHIUM CARBONATE 300 MG PO CAPS
300.0000 mg | ORAL_CAPSULE | ORAL | Status: DC
  Administered 2014-07-16: 300 mg via ORAL
  Filled 2014-07-16: qty 56
  Filled 2014-07-16 (×2): qty 1
  Filled 2014-07-16: qty 56

## 2014-07-16 MED ORDER — HYDROXYZINE HCL 25 MG PO TABS: 25.0000 mg | ORAL_TABLET | ORAL | Status: DC | PRN

## 2014-07-16 MED ORDER — NICOTINE POLACRILEX 2 MG MT GUM: 2.0000 mg | CHEWING_GUM | OROMUCOSAL | Status: DC | PRN

## 2014-07-16 MED ORDER — LITHIUM CARBONATE 300 MG PO CAPS: ORAL_CAPSULE | ORAL | Status: DC

## 2014-07-16 MED ORDER — TRAZODONE HCL 100 MG PO TABS: 100.0000 mg | ORAL_TABLET | Freq: Every day | ORAL | Status: DC

## 2014-07-16 NOTE — Progress Notes (Signed)
  [facility] Adult Case Management Discharge Plan :  Will you be returning to the same living situation after discharge:  Yes,  home, with plans to go to rehab on Monday At discharge, do you have transportation home?: Yes,  mother Do you have the ability to pay for your medications: Yes,  MCD  Release of information consent forms completed and in the chart;  Patient's signature needed at discharge.  Patient to Follow up at: Follow-up Information    Follow up with [facility].   Why:  Make sure you let them know when you get into [facility] so that they can you bring you a supply of medication while you are there   Contact information:   [address] [place], [place] [address] [phone]      Follow up with [facility] On 07/19/2014.   Why:  Arrive on Monday at 8AM sharp for your screening for admission appointment   Contact information:   [address]  [facility]  [phone]      Patient denies SI/HI: Yes,  yes    Safety Planning and Suicide Prevention discussed: Yes,  uyes  Have you used any form of tobacco in the last 30 days? (Cigarettes, Smokeless Tobacco, Cigars, and/or Pipes): Yes  Has patient been referred to the Quitline?: Patient refused referral  "I'm OK with the prescription for gum that the Dr gave me."  Daryel Gerald B 07/16/2014, 11:22 AM

## 2014-07-16 NOTE — Progress Notes (Signed)
Discharge note:  Patient discharged home per MD order.  Patient will follow up at [facility] and [facility].  Reviewed patient's discharge paperwork and medications.  She indicated understanding.  Patient denies SI/HI/AVH.  Patient received all personal belongings, prescription and medication samples.  Patient left ambulatory with her mother.

## 2014-07-16 NOTE — [facility] Suicide Risk Assessment (Signed)
[facility] Discharge Suicide Risk Assessment   Demographic Factors:  Caucasian and Unemployed  Total Time spent with patient: 30 minutes  Musculoskeletal: Strength & Muscle Tone: within normal limits Gait & Station: normal Patient leans: N/A  Psychiatric Specialty Exam: Physical Exam  Review of Systems  Psychiatric/Behavioral: Positive for substance abuse.  All other systems reviewed and are negative.   Blood pressure 100/66, pulse 71, temperature 97.6 F (36.4 C), temperature source Oral, resp. rate 18, height 5\' 5"  (1.651 m), weight 64.864 kg (143 lb), SpO2 100 %.Body mass index is 23.8 kg/(m^2).  General Appearance: Casual  Eye Contact::  Fair  Speech:  Normal Rate409  Volume:  Normal  Mood:  Euthymic  Affect:  Appropriate  Thought Process:  Coherent  Orientation:  Full (Time, Place, and Person)  Thought Content:  WDL  Suicidal Thoughts:  No  Homicidal Thoughts:  No  Memory:  Immediate;   Fair Recent;   Fair Remote;   Fair  Judgement:  Fair  Insight:  Shallow  Psychomotor Activity:  Normal  Concentration:  Fair  Recall:  [employer] of Knowledge:Fair  Language: Fair  Akathisia:  No  Handed:  Right  AIMS (if indicated):     Assets:  Desire for Improvement Physical Health  Sleep:  Number of Hours: 5.75  Cognition: WNL  ADL's:  Intact   Have you used any form of tobacco in the last 30 days? (Cigarettes, Smokeless Tobacco, Cigars, and/or Pipes): Yes  Has this patient used any form of tobacco in the last 30 days? (Cigarettes, Smokeless Tobacco, Cigars, and/or Pipes) Yes, Prescription given for nicotine gum.  Mental Status Per Nursing Assessment::   On Admission:     Current Mental Status by Physician: pt denies SI/HI/AH/VH  Loss Factors: Decrease in vocational status and Loss of significant relationship  Historical Factors: Impulsivity  Risk Reduction Factors:   Positive social support  Continued Clinical Symptoms:  Alcohol/Substance  Abuse/Dependencies Previous Psychiatric Diagnoses and Treatments  Cognitive Features That Contribute To Risk:  None    Suicide Risk:  Minimal: No identifiable suicidal ideation.  Patients presenting with no risk factors but with morbid ruminations; may be classified as minimal risk based on the severity of the depressive symptoms  Principal Problem: Bipolar disorder, curr episode mixed, severe, w/o psychotic features (Acute phase resolved) Discharge Diagnoses:  Patient Active Problem List   Diagnosis Date Noted  . Bipolar disorder, curr episode mixed, severe, w/o psychotic features [F31.63] 07/11/2014  . Cannabis use disorder, moderate, dependence [F12.20] 07/11/2014  . 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 Sinusitis, acute [J01.90] 10/13/2013  . Overweight (BMI 25.0-29.9) [E66.3] 10/12/2013  . Yeast vaginitis [B37.3] 02/04/2013  . Other malaise and fatigue [R53.81, R53.83] 11/06/2012  . Low back pain [M54.5] 11/06/2012  . Menstrual cramp [N94.6] 03/24/2012  . Asthma [493] 06/28/2010  . ADHD [F90.9] 06/13/2007    Follow-up Information    Follow up with [facility].   Why:  Make sure you let them know when you get into [facility] so that they can you bring you a supply of medication while you are there   Contact information:   [address] [place], [place] [address] [phone]      Follow up with [facility] On 07/19/2014.   Why:  Arrive on Monday at 8AM sharp for your screening for admission appointment   Contact information:   [address]  [employer]  [phone]      Plan Of  Care/Follow-up recommendations:  Activity:  No restrictions Diet:  regular Tests:  Li level in 5 days . Pt also needs to follow up BMP, TSH as needed per out patient recommendations Other:  Follow up with [facility] as scheduled  Is patient on multiple antipsychotic therapies at discharge:  No   Has Patient had three or more failed trials of antipsychotic monotherapy by history:   No  Recommended Plan for Multiple Antipsychotic Therapies: NA    Crystall Donaldson MD 07/16/2014, 9:23 AM

## 2014-07-16 NOTE — Discharge Summary (Signed)
Physician Discharge Summary Note  Patient:  [name] is an 34 y.o., female MRN:  [id number] DOB:  [birth year] Patient phone:  [phone] (home)  Patient address:   [address] [place] [address],  Total Time spent with patient: Greater than 30 minutes  Date of Admission:  07/11/2014  Date of Discharge: 07-16-14  Reason for Admission:   Principal Problem: Bipolar disorder, curr episode mixed, severe, w/o psychotic features Discharge Diagnoses: Patient Active Problem List   Diagnosis Date Noted  . Bipolar disorder, curr episode mixed, severe, w/o psychotic features [F31.63] 07/11/2014  . Cannabis use disorder, moderate, dependence [F12.20] 07/11/2014  . 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 Sinusitis, acute [J01.90] 10/13/2013  . Overweight (BMI 25.0-29.9) [E66.3] 10/12/2013  . Yeast vaginitis [B37.3] 02/04/2013  . Other malaise and fatigue [R53.81, R53.83] 11/06/2012  . Low back pain [M54.5] 11/06/2012  . Menstrual cramp [N94.6] 03/24/2012  . Asthma [493] 06/28/2010  . ADHD [F90.9] 06/13/2007   Musculoskeletal: Strength & Muscle Tone: within normal limits Gait & Station: normal Patient leans: N/A  Psychiatric Specialty Exam: Physical Exam  Constitutional: She is oriented to person, place, and time. She appears well-developed.  HENT:  Head: Normocephalic.  Eyes: Pupils are equal, round, and reactive to light.  Neck: Normal range of motion.  Cardiovascular: Normal rate.   Respiratory: Effort normal.  GI: Soft.  Genitourinary:  Denies any issues in this area  Musculoskeletal: Normal range of motion.  Neurological: She is alert and oriented to person, place, and time.  Skin: Skin is warm and dry.  Psychiatric: Her speech is normal and behavior is normal. Judgment and thought content normal. Her mood appears not anxious. Her affect is not angry, not blunt, not labile and not inappropriate. Cognition and memory are normal. She does not  exhibit a depressed mood.    Review of Systems  Constitutional: Negative.   HENT: Negative.   Eyes: Negative.   Respiratory: Negative.   Cardiovascular: Negative.   Gastrointestinal: Negative.   Genitourinary: Negative.   Musculoskeletal: Negative.   Skin: Negative.   Neurological: Negative.   Endo/Heme/Allergies: Negative.   Psychiatric/Behavioral: Positive for depression (Stable) and substance abuse (Cannabis & Stimulant abuse). Negative for suicidal ideas, hallucinations and memory loss. The patient has insomnia (Stable). The patient is not nervous/anxious.     Blood pressure 100/66, pulse 71, temperature 97.6 F (36.4 C), temperature source Oral, resp. rate 18, height 5\' 5"  (1.651 m), weight 64.864 kg (143 lb), SpO2 100 %.Body mass index is 23.8 kg/(m^2).  See Md's SRA   Have you used any form of tobacco in the last 30 days? (Cigarettes, Smokeless Tobacco, Cigars, and/or Pipes): Yes  Has this patient used any form of tobacco in the last 30 days? (Cigarettes, Smokeless Tobacco, Cigars, and/or Pipes): Yes, prescription provided.  Past Medical History:  Past Medical History  Diagnosis Date  . Abnormal Pap smear   . Headache(784.0)   . Anemia   . Anxiety   . PPH (postpartum hemorrhage)   . Asthma   . Blood transfusion     Pt delivery in 2009  . Complication of anesthesia   . Spinal headache    History reviewed. No pertinent past surgical history. Family History:  Family History  Problem Relation Age of Onset  . Thrombophlebitis Mother   . Hypertension Father   . Heart disease Maternal Grandmother   . Heart disease Paternal Grandfather   . Hypertension Paternal Grandfather    Social History:  History  Alcohol Use No     History  Drug Use  . Yes  . Special: Marijuana, Amphetamines    History   Social History  . Marital Status: Single    Spouse Name: N/A  . Number of Children: N/A  . Years of Education: N/A   Social History Main Topics  . Smoking status:  Former Games developer  . Smokeless tobacco: Never Used  . Alcohol Use: No  . Drug Use: Yes    Special: Marijuana, Amphetamines  . Sexual Activity: Not Currently     Comment: Pt currently pregnant   Other Topics Concern  . None   Social History Narrative   Risk to Self: Suicidal Ideation: Yes-Currently Present Suicidal Intent: No Is patient at risk for suicide?: No Suicidal Plan?: No Access to Means:  (n/a) What has been your use of drugs/alcohol within the last 12 months?: frequent marijuana use How many times?: 0 Other Self Harm Risks: none Triggers for Past Attempts:  (n/a) Intentional Self Injurious Behavior: None Risk to Others: Homicidal Ideation: No Thoughts of Harm to Others: No Current Homicidal Intent: No Current Homicidal Plan: No Access to Homicidal Means: No Identified Victim: none History of harm to others?: No Assessment of Violence: None Noted Violent Behavior Description: pt denies hx of harm - is cooperative Does patient have access to weapons?: No Criminal Charges Pending?: No Does patient have a court date: No Prior Inpatient Therapy: Prior Inpatient Therapy: Yes Prior Therapy Dates: 2016 and earlier years Prior Therapy Facilty/Provider(s): [facility], [facility] Reason for Treatment: bipolar, psychosis Prior Outpatient Therapy: Prior Outpatient Therapy: Yes Prior Therapy Dates: currently Prior Therapy Facilty/Provider(s): [facility] Reason for Treatment: med management, therapy Does patient have an ACCT team?: Yes Does patient have Intensive In-House Services?  : No Does patient have [facility] services? : No Does patient have P4CC services?: Unknown  Level of Care:  OP  Hospital Course:  [name] is an 34 y.o.caucasian female who presented voluntarily to [name] for an assessment accompanied by her ex partner and father of her two children (2 & 7), [name]. Patient appears to be very tearful, endorses anxiety , depression as well as  paranoia. She appears depressed & labile. Patient reports that she - after her discharge from [facility] in April 2016- did not stay on her medications. Pt was started on Lamictal by her out patient provider to which she had a bad reaction. Pt started abusing Vyvanse again . Pt was kicked out of the apartment by her mother. Pt went to live with her dad for a few days and then ended up homeless again. Pt requested her ex boyfriend [name] to help her and he brought her to the ED.  Anvika was admitted to the adult unit for symptoms of bipolar disorder. She was also abusing some drugs (Vyvanse). During her admission assessment, she was evaluated and her symptoms were identified. Medication management was discussed and initiated targeting her presenting symptoms. She was oriented to the unit and encouraged to participate in the unit programming.          During her hospital stay, Syriah was evaluated each day by a clinical provider to ascertain her response to her treatment regimen. As the day goes by, improvement was noted by the patient's report of decreasing symptoms, improved sleep, appetite, affect, medication tolerance, behavior, and participation in the unit programming. She was required on daily basis to complete a self inventory asssessment noting mood, mental status, pain,  new symptoms, anxiety and concerns. Her symptoms responded well to her treatment regimen. Also, being in a therapeutic and supportive environment assisted in her mood stability. Alenis did present appropriate behavior & was motivated for recovery. She worked closely with the treatment team and case manager to develop a discharge plan with appropriate goals to maintain mood stability after discharge. Coping skills, problem solving as well as relaxation therapies were also part of the unit programming.  On this day of her hospital discharge, Josey was in much improved condition than upon admission. Her symptoms were reported as significantly  decreased or resolved completely. Upon discharge, she denies SI/HI and voiced no AVH. She was motivated to continue taking medication with a goal of continued improvement in mental health. She was medicated & discharged on; Gabapentin 200 mg for agitation, Hydroxyzine 25 mg for anxiety prn, Lithium Carbonate 300 mg in am & 600 mg Q bedtime, Geodon 60 mg for mood control & Trazodone 60 mg Q hs for insomnia. She is being discharged to follow-up care as noted below. She received a 14 days worth, supply samples of her [facility] discharge medications. She left [facility] with all personal belongings in no distress. Transportation per mother.   Consults:  psychiatry  Significant Diagnostic Studies:  labs: CBC with diff, CMP, UDS, toxicology tests, U/A, results reviewed, stable  Discharge Vitals:   Blood pressure 100/66, pulse 71, temperature 97.6 F (36.4 C), temperature source Oral, resp. rate 18, height 5\' 5"  (1.651 m), weight 64.864 kg (143 lb), SpO2 100 %. Body mass index is 23.8 kg/(m^2). Lab Results:   Results for orders placed or performed during the hospital encounter of 07/11/14 (from the past 72 hour(s))  Lithium level     Status: Abnormal   Collection Time: 07/16/14  6:43 AM  Result Value Ref Range   Lithium Lvl 0.48 (L) 0.60 - 1.20 mmol/L    Comment: Performed at [facility]    Physical Findings: AIMS: Facial and Oral Movements Muscles of Facial Expression: None, normal Lips and Perioral Area: None, normal Jaw: None, normal Tongue: None, normal,Extremity Movements Upper (arms, wrists, hands, fingers): None, normal Lower (legs, knees, ankles, toes): None, normal, Trunk Movements Neck, shoulders, hips: None, normal, Overall Severity Severity of abnormal movements (highest score from questions above): None, normal Incapacitation due to abnormal movements: None, normal Patient's awareness of abnormal movements (rate only patient's report): No Awareness, Dental Status Current  problems with teeth and/or dentures?: No Does patient usually wear dentures?: No  CIWA:  CIWA-Ar Total: 1 COWS:  COWS Total Score: 1  See Psychiatric Specialty Exam and Suicide Risk Assessment completed by Attending Physician prior to discharge.  Discharge destination:  Home, then to [facility] on Monday  Is patient on multiple antipsychotic therapies at discharge:  No   Has Patient had three or more failed trials of antipsychotic monotherapy by history:  No  Recommended Plan for Multiple Antipsychotic Therapies: NA     Discharge Instructions    Discharge instructions    Complete by:  As directed   Check Lithium levels in 5 days (Wednesday - 07-21-14)            Medication List    STOP taking these medications        carbamazepine 100 MG 12 hr tablet  Commonly known as:  TEGRETOL XR     cetirizine 10 MG tablet  Commonly known as:  ZYRTEC     clonazePAM 0.5 MG tablet  Commonly known as:  KLONOPIN     clonazePAM 1 MG tablet  Commonly known as:  KLONOPIN     fluticasone 50 MCG/ACT nasal spray  Commonly known as:  FLONASE     Iron 325 (65 FE) MG Tabs     sennosides-docusate sodium 8.6-50 MG tablet  Commonly known as:  SENOKOT-S      TAKE these medications      Indication   albuterol 108 (90 BASE) MCG/ACT inhaler  Commonly known as:  PROAIR HFA  Inhale 1 puff into the lungs every 4 (four) hours as needed. Dispense with spacer. For wheezing   Indication:  Asthma     gabapentin 100 MG capsule  Commonly known as:  NEURONTIN  Take 2 capsules (200 mg total) by mouth 3 (three) times daily at 8am, 3pm and bedtime. For substance withdrawal syndrome/agitation   Indication:  Agitation, Substance withdrawal syndrome     hydrocortisone cream 1 %  Apply topically 3 (three) times daily. Itching   Indication:  Itching     hydrOXYzine 25 MG tablet  Commonly known as:  ATARAX/VISTARIL  Take 1 tablet (25 mg total) by mouth every 4 (four) hours as needed for itching or  anxiety.   Indication:  Itching with Atopic Dermatitis, Anxiety     lithium carbonate 300 MG capsule  Take 1 tablet (300 mg at 7 am, 12 noon & 2 tablets at supper: For mood stabilization   Indication:  Mood stabilization     nicotine polacrilex 2 MG gum  Commonly known as:  NICORETTE  Take 1 each (2 mg total) by mouth as needed for smoking cessation.   Indication:  Nicotine Addiction     traZODone 100 MG tablet  Commonly known as:  DESYREL  Take 1 tablet (100 mg total) by mouth at bedtime. For insomnia   Indication:  Trouble Sleeping     ziprasidone 60 MG capsule  Commonly known as:  GEODON  Take 1 capsule (60 mg total) by mouth 2 (two) times daily with a meal. For mood control   Indication:  Manic-Depression       Follow-up Information    Follow up with [facility].   Why:  Make sure you let them know when you get into [facility] so that they can you bring you a supply of medication while you are there   Contact information:   [address] [place], [place] [address] [phone]      Follow up with [facility] On 07/19/2014.   Why:  Arrive on Monday at 8AM sharp for your screening for admission appointment   Contact information:   [address]  [facility]  [phone]     Follow-up recommendations: Activity:  As tolerated Diet: As recommended by your primary care doctor. Keep all scheduled follow-up appointments as recommended.  Comments: Take all your medications as prescribed by your mental healthcare provider. Report any adverse effects and or reactions from your medicines to your outpatient provider promptly. Patient is instructed and cautioned to not engage in alcohol and or illegal drug use while on prescription medicines. In the event of worsening symptoms, patient is instructed to call the crisis hotline, 911 and or go to the nearest ED for appropriate evaluation and treatment of symptoms. Follow-up with your primary care provider for your other  medical issues, concerns and or health care needs.   Total Discharge Time: Greater than 30 minutes  Signed: Sanjuana Kava, PMHNP, FNp-BC 07/16/2014, 4:01 PM

## 2014-07-19 NOTE — Clinical Social Work Note (Signed)
Care Coordinator Darnelle Maffucci [phone]) wanted update on patient discharge referrals, information left on CC VM.  Santa Genera, LCSW Clinical Social Worker

## 2014-09-14 ENCOUNTER — Ambulatory Visit (INDEPENDENT_AMBULATORY_CARE_PROVIDER_SITE_OTHER): Payer: Medicaid Other | Admitting: Family Medicine

## 2014-09-14 ENCOUNTER — Other Ambulatory Visit (HOSPITAL_COMMUNITY)
Admission: RE | Admit: 2014-09-14 | Discharge: 2014-09-14 | Disposition: A | Discharge status: Home / Self Care | Payer: Medicaid Other | Source: Ambulatory Visit | Attending: Family Medicine | Admitting: Family Medicine

## 2014-09-14 ENCOUNTER — Encounter: Payer: Self-pay | Admitting: Family Medicine

## 2014-09-14 VITALS — BP 143/97 | HR 91 | Temp 98.0°F | Ht 65.0 in | Wt 146.3 lb

## 2014-09-14 DIAGNOSIS — K921 Melena: Secondary | ICD-10-CM

## 2014-09-14 DIAGNOSIS — R5381 Other malaise: Secondary | ICD-10-CM

## 2014-09-14 DIAGNOSIS — N63 Unspecified lump in unspecified breast: Secondary | ICD-10-CM

## 2014-09-14 DIAGNOSIS — E041 Nontoxic single thyroid nodule: Secondary | ICD-10-CM | POA: Diagnosis not present

## 2014-09-14 DIAGNOSIS — N926 Irregular menstruation, unspecified: Secondary | ICD-10-CM

## 2014-09-14 DIAGNOSIS — J45901 Unspecified asthma with (acute) exacerbation: Secondary | ICD-10-CM | POA: Diagnosis not present

## 2014-09-14 DIAGNOSIS — Z113 Encounter for screening for infections with a predominantly sexual mode of transmission: Secondary | ICD-10-CM | POA: Diagnosis present

## 2014-09-14 DIAGNOSIS — N898 Other specified noninflammatory disorders of vagina: Secondary | ICD-10-CM | POA: Diagnosis present

## 2014-09-14 DIAGNOSIS — R5383 Other fatigue: Secondary | ICD-10-CM | POA: Diagnosis not present

## 2014-09-14 DIAGNOSIS — N649 Disorder of breast, unspecified: Secondary | ICD-10-CM | POA: Diagnosis not present

## 2014-09-14 DIAGNOSIS — L988 Other specified disorders of the skin and subcutaneous tissue: Secondary | ICD-10-CM | POA: Insufficient documentation

## 2014-09-14 DIAGNOSIS — K625 Hemorrhage of anus and rectum: Secondary | ICD-10-CM | POA: Diagnosis not present

## 2014-09-14 LAB — CBC WITH DIFFERENTIAL/PLATELET
Basophils Absolute: 0.1 10*3/uL (ref 0.0–0.1)
Basophils Relative: 1 % (ref 0–1)
EOS PCT: 2 % (ref 0–5)
Eosinophils Absolute: 0.2 10*3/uL (ref 0.0–0.7)
HEMATOCRIT: 42.6 % (ref 36.0–46.0)
Hemoglobin: 14.4 g/dL (ref 12.0–15.0)
LYMPHS PCT: 31 % (ref 12–46)
Lymphs Abs: 2.6 10*3/uL (ref 0.7–4.0)
MCH: 30.7 pg (ref 26.0–34.0)
MCHC: 33.8 g/dL (ref 30.0–36.0)
MCV: 90.8 fL (ref 78.0–100.0)
MONO ABS: 0.6 10*3/uL (ref 0.1–1.0)
MPV: 10.2 fL (ref 8.6–12.4)
Monocytes Relative: 7 % (ref 3–12)
Neutro Abs: 5 10*3/uL (ref 1.7–7.7)
Neutrophils Relative %: 59 % (ref 43–77)
Platelets: 218 10*3/uL (ref 150–400)
RBC: 4.69 MIL/uL (ref 3.87–5.11)
RDW: 12.3 % (ref 11.5–15.5)
WBC: 8.5 10*3/uL (ref 4.0–10.5)

## 2014-09-14 LAB — COMPREHENSIVE METABOLIC PANEL
ALK PHOS: 58 U/L (ref 33–115)
ALT: 11 U/L (ref 6–29)
AST: 16 U/L (ref 10–30)
Albumin: 4.2 g/dL (ref 3.6–5.1)
BUN: 11 mg/dL (ref 7–25)
CALCIUM: 9.4 mg/dL (ref 8.6–10.2)
CO2: 30 mmol/L (ref 20–31)
Chloride: 104 mmol/L (ref 98–110)
Creat: 0.7 mg/dL (ref 0.50–1.10)
GLUCOSE: 102 mg/dL — AB (ref 65–99)
Potassium: 3.8 mmol/L (ref 3.5–5.3)
Sodium: 143 mmol/L (ref 135–146)
Total Bilirubin: 0.6 mg/dL (ref 0.2–1.2)
Total Protein: 6.6 g/dL (ref 6.1–8.1)

## 2014-09-14 LAB — HEMOCCULT GUIAC POC 1CARD (OFFICE): FECAL OCCULT BLD: NEGATIVE

## 2014-09-14 LAB — POCT WET PREP (WET MOUNT): Clue Cells Wet Prep Whiff POC: NEGATIVE

## 2014-09-14 LAB — TSH: TSH: 0.439 u[IU]/mL (ref 0.350–4.500)

## 2014-09-14 LAB — POCT HEMOGLOBIN: Hemoglobin: 15.2 g/dL (ref 12.2–16.2)

## 2014-09-14 MED ORDER — PREDNISONE 50 MG PO TABS: ORAL_TABLET | ORAL | Status: DC

## 2014-09-14 NOTE — Assessment & Plan Note (Signed)
Small 1 cm nodule palpated over the left thyroid -check TSH -further imaging pending TSH result

## 2014-09-14 NOTE — Progress Notes (Signed)
Subjective:     Patient ID: [name], female   DOB: 1980/09/29, 34 y.o.   MRN: [id number]  HPI  Documentation by Bing Quarry Pisanie MS3 (black writing).  34 y/o female presents for evaluation of multiple acute issues.   ASTHMA Patient describes feeling extreme fatigue, cough and shortness of breath for the past couple of months which interferes with her exercise and daily life. She wakes up SOB a 1-2 nights a week. She has difficulty running and going up stairs. She denies any chest pain but does endorse chest tightness. Patient went to the ED for asthma when she was younger but has not been in her adulthood. Patient uses her albuterol inhaler ~2x per week. Not on a controller medication. No recent exacerbations.   IRREGULAR HEAVY MENSTRUAL BLEEDING [id number] who for the past few weeks has been experiencing heavy flow and spotting in between periods. Does not have any dyspareunia, dysmenorrhea, dysuria, or hematuria. Patient reports regular monthly periods lasting 7 days since last pregnancy 3 years ago, she reports occasional intermittent spotting in between periods over the past few years.   RECTAL BLEEDING For the past 5 days and intermittently over the past few months the patient has been having dark stools with bright red blood in the bowl. She denies any pain on defecation or blood after wiping but does have intermittent spells of constipation which require her to use an enema. She has been to the ED for disimpaction 3 times in the past. She attributes this to the psychiatric medications that she is on. Has cycling diarrhea with constipation. Takes Senna PRN constipation.   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 Patient says she feels a new mass which is larger and more painful with her periods at the 4 o'clock position in her right breast. She also says that she sometimes sees clear yellow discharge coming from her right breast a few days a  week but denies any blood, gross breast erythema or pain.  MOLE ON RIGHT NIPPLE A month ago she noticed a small mole / skin tag pop up on her right nipple. It is painful when it rubs on her sportsbra and clothing. She says that it hasn't really changed since it showed up.   GENERALIZED FATIGUE Patient has had extreme fatigue and night sweats for the past few months. She denies any signs fevers, weight loss or other constitutional symptoms. She denies any symptoms of depression or suicidal ideation, just frustration with her fatigue.  Social - lives with husband and two children, former smoker   Review of Systems Pertinent ROS as per HPI    Objective:   Physical Exam  General: Mildly anxious appearing, NAD, fidgety.  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 No anterior or posterior cervical lymphadenopathy CV: RRR, nl S1 S2, no m/r/g no cyanosis edema or clubbing  PULM: diffuse expiratory wheezing heard bilaterally, no crackles, rales or rhonchi heard. Breath sounds heard throughout. GI: normal bowel sounds, soft, slight LLQ tenderness to palpation, non distended, no HSM Neuro: AOx3, normal strength throughout, normal ROM.  GU: No external vulvar lesions, slight uterine tenderness during the bimanual exam, on speculum exam white discharge noted in the vaginal canal. Cervix was healthy appearing, no vaginal lesions/lacerations BREAST: Small ~2 c  2 mm dark brown mole on the outer edge of her right nipple. Areolas smooth non erythematous or crusty. No other lesions noted on breasts bilaterally. Small round mobile ~1cm mass palpated at the 4 o'clock position near her outer areola of her right breast. Slightly tender to palpation. No axial lymphadenopathy or other masses  palpated bilaterally.   RECTAL: No external hemorrhoids seen, no internal hemorrhoids palpated or seen. No lesions on the anus. Rectal tone normal. Stool in rectal vault no gross blood.  No lesions seen on rectum. PSYCH: Normal affect, slightly anxious, speech fluid but slightly disjointed, no signs of acute mania or psychosis.   POC hemoglobin 15 POC hemoccult negative    Assessment and Plan     ASTHMA: Most likely an exacerbation due to diffuse end expiratory wheezing and SOB. No signs of pneumothorax or pleural effusion. No crackles or concern for pneumonia (productive cough / fever).  - Prescribed prednisone 50 mg for 5 days, use albuterol as needed  IRREGULAR MENSTRUAL PERIODS: No concern for malignancy, GU exam normal. POC Hb 15.  - will monitor -TSH and CBC - Set up patient for ob/gyn since previous physician no longer accepts her insurance  - Patient states that she recently had a pap smear <77yrs before her insurance issue with er previous gynecologist  RECTAL BLEEDING: May be due to internal hemorrhoids given history of constipation although none palpted or seen on rectal exam. No external hemorrhoids noted either. FOBT negative. Diverticulosis or inflammatory bowel disease are on the ddx however unlikely and Sx will be monitored. - CBC - CMP  BREAST MASS: Fibrocystic change vs fibroadenoma. No concern for malignancy, small mobile mass which is slightly tender to palpation. Worse with menstrual cycle all consistent with fibrocystic change or fibroadenoma. Unilateral yellow clear nipple discharge could be due to her ziprasidone and is most probably benign considering it is intermittent and non bloody. Previous breast biopsy of different lesion in same breast showed fibroadenoma.  -will monitor for change in symptoms or new masses - informed patient that is discharge get bloody or mass gets bigger to inform physician.   MOLE ON RIGHT NIPPLE: Most likely benign nevus but must rule out  other etiologies such as melanoma or padget's disease of breast. - will perform a mole removal/biopsy at follow up appointment - Patient will make follow-up appointment for comprehensive skin exam.  GENERALIZED FATIGUE: will monitor etiology is unclear at the moment. Will test thyroid and CBC to start. Will monitor.  - CBC - TSH  - CMP  Armando Bukhari du Pisanie MS3  I personally saw and evaluated the patient. I have reviewed the medical student note and agree with the documentation. I personally completed the physical exam. Additions to the medical student note are made in blue.   Donnella Sham MD

## 2014-09-14 NOTE — Assessment & Plan Note (Signed)
Patient with acute exacerbation of asthma. -start Prednisone 50 mg daily for 5 days -use Albuterol prn

## 2014-09-14 NOTE — Assessment & Plan Note (Signed)
Patient reports 5 day history of rectal bleeding. POC hemoccult negative. POC hemoglobin 15. Rectal exam unremarkable. Suspect related to intermittent constipation. FM of colon cancer of grandmother in 8's. No early FM of cancer.  -continue PRN stool softeners -patient also has intermittent diarrhea followed by constipation (may be consistent with IBS), if symptoms continue consider referral to GI.

## 2014-09-14 NOTE — Assessment & Plan Note (Signed)
Patient reports a few week history of increasing fatigue. Does also have complaints of rectal and GU bleeding. See specific assessment and plans. POC hemoglobin 15 suggesting against anemia as the cause. Also has acute asthma exacerbation which may be contributing.  -check basic labs TSH/BMP/CBC

## 2014-09-14 NOTE — Assessment & Plan Note (Signed)
Patient with heavy flow and intermittent spotting since birth of child 3 years ago. GYN unremarkable except slight uterine tenderness and moderate amount white discharge. -check GC/Chlam/Wet prep -TSH/CBC/BMP -if labs negative will order vaginal [place] and set up for endometrial biopsy

## 2014-09-14 NOTE — Assessment & Plan Note (Signed)
Patient has former fibroadenoma at 10 o'clock position on the right. Now has small approx. 1 cmm mobile soft mass at 4 oclock position of the right breast most consistent with fibroadenoma (history also consistent with fibroadenoma).  -no plan for imaging at this time -patient to monitor for signs of increased size and pain

## 2014-09-14 NOTE — Patient Instructions (Addendum)
It was nice to see you today.  Mole on breast - Please make a follow up appointment to have the mole on your right breast removed.  Skin issues - Please make a follow up appointment to have your entire skin examined.   Knot in breast - I think that this is likely a fibroadenoma (a benign breast mass), you may take ibuprofen with your periods to help relieve the pain.  Blood in stool - there was no blood in your stool today, I suspect that the blood was from some constipation  Asthma - take prednisone 50 mg for 5 days, use albuterol as needed  Irregular periods with spotting - your exam was normal today, Dr. Randolm Idol will call you with your lab tests.   Fatigue - I will check your thyroid as this is a common cause of fatigue

## 2014-09-14 NOTE — Assessment & Plan Note (Signed)
2 mm by 2 mm homogenous hyperpigmented macule of right outer nipple. Most consistent with benign mole however given concurrent irritation will set up for removal. -patient to schedule follow up for removal.

## 2014-09-15 ENCOUNTER — Other Ambulatory Visit: Payer: Self-pay | Admitting: Family Medicine

## 2014-09-15 ENCOUNTER — Telehealth: Payer: Self-pay | Admitting: Family Medicine

## 2014-09-15 DIAGNOSIS — E041 Nontoxic single thyroid nodule: Secondary | ICD-10-CM

## 2014-09-15 DIAGNOSIS — N926 Irregular menstruation, unspecified: Secondary | ICD-10-CM

## 2014-09-15 DIAGNOSIS — N939 Abnormal uterine and vaginal bleeding, unspecified: Secondary | ICD-10-CM

## 2014-09-15 LAB — CERVICOVAGINAL ANCILLARY ONLY
CHLAMYDIA, DNA PROBE: NEGATIVE
Neisseria Gonorrhea: NEGATIVE

## 2014-09-15 NOTE — Assessment & Plan Note (Signed)
Metabolic workup unremarkable. -check Pelvic US -hold on endometrial biopsy at this time

## 2014-09-15 NOTE — Telephone Encounter (Signed)
Discussed lab results. Grossly negative workup including CBC/BMP/TSH/vaginal cultures.   In regards to menometthoragia will order transvaginal US. Patient also requests referral to GYN. Will place orders in EPIC. For the thyroid nodule will check thyroid US. Will place orders in EPIC.  RN staff - please schedule thyroid US and Pelvic US

## 2014-09-16 ENCOUNTER — Telehealth: Payer: Self-pay | Admitting: Family Medicine

## 2014-09-16 NOTE — Telephone Encounter (Signed)
RN staff - I recently ordered at thyroid US and Pelvic US for this patient, can you look into when they were scheduled and contact the patient, thanks

## 2014-09-16 NOTE — Telephone Encounter (Signed)
Ms. [name] calling to inquire about U./S for her neck and stomach.  Please contact to inform when appt/

## 2014-09-17 NOTE — Telephone Encounter (Signed)
Our referral coordinator spoke with pt and she wants to schedule appt when its approved by her insurance. Waiting on one approval to come back. [name], CMA

## 2014-09-21 ENCOUNTER — Telehealth: Payer: Self-pay | Admitting: Family Medicine

## 2014-09-21 NOTE — Telephone Encounter (Signed)
Pt is aware of appt

## 2014-09-21 NOTE — Telephone Encounter (Signed)
Error

## 2014-09-21 NOTE — Telephone Encounter (Signed)
LMOVM. Please advise patient that her Pelvic and thyroid ultrasounds have been scheduled for Friday September 24, 2014 at 7:30 AM (arrival 7:15 AM) at [facility], 1st Floor radiology. Patient should come to this appt with a full bladder.

## 2014-09-22 ENCOUNTER — Ambulatory Visit: Payer: Self-pay | Admitting: Obstetrics

## 2014-09-24 ENCOUNTER — Ambulatory Visit (HOSPITAL_COMMUNITY): Payer: Medicaid Other

## 2014-10-11 ENCOUNTER — Ambulatory Visit (HOSPITAL_COMMUNITY): Payer: Medicaid Other

## 2014-10-11 ENCOUNTER — Other Ambulatory Visit (HOSPITAL_COMMUNITY): Payer: Medicaid Other

## 2015-04-05 ENCOUNTER — Ambulatory Visit (INDEPENDENT_AMBULATORY_CARE_PROVIDER_SITE_OTHER): Payer: Medicaid Other | Admitting: Obstetrics and Gynecology

## 2015-04-05 ENCOUNTER — Encounter: Payer: Self-pay | Admitting: Obstetrics and Gynecology

## 2015-04-05 VITALS — BP 119/67 | HR 102 | Temp 97.7°F | Ht 65.0 in | Wt 150.0 lb

## 2015-04-05 DIAGNOSIS — M25532 Pain in left wrist: Secondary | ICD-10-CM | POA: Diagnosis not present

## 2015-04-05 MED ORDER — WRIST SPLINT/COCK-UP/LEFT M MISC: Status: DC

## 2015-04-05 NOTE — Progress Notes (Deleted)
   Subjective:   Patient ID: [name], female    DOB: [birth year], 35 y.o.   MRN: [id number]  Patient presents for Same Day Appointment  Chief Complaint  Patient presents with  . Back Pain  . Knee Pain  . Wrist Pain    HPI: EXTREMITY PAIN  Location: *** Pain started: *** Pain is: *** Severity: *** Medications tried: *** Recent trauma: *** Similar pain previously: ***  Symptoms Redness:***  Swelling: some Fever: *** Weakness: yes Weight loss: *** Rash: ***   Review of Symptoms - see HPI PMH - Smoking status noted.      #BACK PAIN  Back pain began *** days ago. Pain is described as ***. Patient has tried ***. Pain radiates ***. History of trauma or injury: *** Prior history of similar pain: *** History of cancer: *** Weak immune system:  *** History of IV drug use: *** History of steroid use: ***  Symptoms Incontinence of bowel or bladder:  *** Numbness of leg: *** Fever: *** Rest or Night pain: *** Weight Loss:  **** Rash: ***  Patient believes *** might be causing their pain.  ROS see HPI Smoking Status noted.  ***   Review of Systems   See HPI for ROS.   Past medical history, surgical, family, and social history reviewed and updated in the EMR as appropriate.  Objective:  BP 119/67 mmHg  Pulse 102  Temp(Src) 97.7 F (36.5 C) (Oral)  Ht 5\' 5"  (1.651 m)  Wt 150 lb (68.04 kg)  BMI 24.96 kg/m2  LMP 03/23/2015 Vitals and nursing note reviewed  Physical Exam  Assessment & Plan:  See Problem List Documentation   Caryl AdaJazma Araeya Lamb, DO 04/05/2015, 8:36 AM PGY-2, [facility]

## 2015-04-05 NOTE — Patient Instructions (Addendum)
Was only able to discuss one issue this visit -- wrist pain If wrist pain not better by next week please return Use brace and ibuprofen and rest the wrist  Please schedule an appointment for regular clinic visit to discuss multiple other concerns NOT SDA - Schedule with PCP  Carpal Tunnel Syndrome Carpal tunnel syndrome is a condition that causes pain in your hand and arm. The carpal tunnel is a narrow area that is on the palm side of your wrist. Repeated wrist motion or certain diseases may cause swelling in the tunnel. This swelling can pinch the main nerve in the wrist (median nerve).  HOME CARE If You Have a Splint:  Wear it as told by your doctor. Remove it only as told by your doctor.  Loosen the splint if your fingers:  Become numb and tingle.  Turn blue and cold.  Keep the splint clean and dry. General Instructions  Take over-the-counter and prescription medicines only as told by your doctor.  Rest your wrist from any activity that may be causing your pain. If needed, talk to your employer about changes that can be made in your work, such as getting a wrist pad to use while typing.  If directed, apply ice to the painful area:  Put ice in a plastic bag.  Place a towel between your skin and the bag.  Leave the ice on for 20 minutes, 2-3 times per day.  Keep all follow-up visits as told by your doctor. This is important.  Do any exercises as told by your doctor, physical therapist, or occupational therapist. GET HELP IF:  You have new symptoms.  Medicine does not help your pain.  Your symptoms get worse.   This information is not intended to replace advice given to you by your health care provider. Make sure you discuss any questions you have with your health care provider.   Document Released: 01/04/2011 Document Revised: 10/06/2014 Document Reviewed: 06/02/2014 Elsevier Interactive Patient Education [employer]2016 Elsevier Inc.

## 2015-04-05 NOTE — Progress Notes (Signed)
   Subjective:   Patient ID: [name], female    DOB: [birth year], 35 y.o.   MRN: [id number]  Patient presents for Same Day Appointment. Of note, patient with multiple complaints. Since this is SDA clinic patient decided to talk about her wrist pain.   Chief Complaint  Patient presents with  . Back Pain  . Knee Pain  . Wrist Pain    HPI: EXTREMITY PAIN: Location: Left wrist Pain started: Several days ago but no longer than 2 weeks Pain is: worse with certain movements and picking up things Pain is sharp in nature and is mostly located in her thumb and first digits which has change in sensation. The nerve pain goes up midway to her forearm Medications tried: None Recent trauma: Denies Similar pain previously: No Right-hand-dominant Stays at home with children  Symptoms Redness: No Swelling: some Fever: yes 100 (intermittent for last 2 days), some chills Weakness: yes Rash: no  Review of Symptoms - see HPI PMH - Smoking status noted.    Past medical history, surgical, family, and social history reviewed and updated in the EMR as appropriate.  Objective:  BP 119/67 mmHg  Pulse 102  Temp(Src) 97.7 F (36.5 C) (Oral)  Ht 5\' 5"  (1.651 m)  Wt 150 lb (68.04 kg)  BMI 24.96 kg/m2  LMP 03/23/2015 Vitals and nursing note reviewed  Physical Exam  Constitutional: She is well-developed, well-nourished, and in no distress.  Musculoskeletal: Normal range of motion.       Left wrist: She exhibits normal range of motion and no swelling.  Neurological: She is alert. She has intact cranial nerves. She displays weakness. A sensory deficit is present.  Paresthesia of palmar thumb and index fingers with proximal radiation into forearm. Decreased grip strength on left. Positive Phalen's test and negative Tinel's.  Skin: Skin is warm and dry. No rash noted.  Psychiatric: Her mood appears anxious. She is agitated.    Assessment & Plan:  1. Left wrist pain Patient with left wrist  pain consistent with carpal tunnel syndrome. She had this before when she was pregnant but has not had any issues since. Physical exam and history also consistent with carpal tunnel. Patient prescribed wrist splint to use. She can continue using her ibuprofen 800mg  for pain relief and anti-inflammatory effects. Handout given on carpal tunnel.   Discussed with patient and she will need to come back in for her other concerns as this was a same day appointment and one issue is usually discussed at a time.   Caryl AdaJazma Phelps, DO 04/05/2015, 9:24 AM PGY-2, [facility]

## 2015-04-13 ENCOUNTER — Encounter: Payer: Self-pay | Admitting: Family Medicine

## 2015-04-13 ENCOUNTER — Ambulatory Visit (INDEPENDENT_AMBULATORY_CARE_PROVIDER_SITE_OTHER): Payer: Medicaid Other | Admitting: Family Medicine

## 2015-04-13 VITALS — BP 130/86 | HR 82 | Temp 98.0°F | Ht 65.0 in | Wt 155.9 lb

## 2015-04-13 DIAGNOSIS — M654 Radial styloid tenosynovitis [de Quervain]: Secondary | ICD-10-CM | POA: Diagnosis not present

## 2015-04-13 DIAGNOSIS — M545 Low back pain, unspecified: Secondary | ICD-10-CM

## 2015-04-13 DIAGNOSIS — M25561 Pain in right knee: Secondary | ICD-10-CM | POA: Diagnosis not present

## 2015-04-13 DIAGNOSIS — H7292 Unspecified perforation of tympanic membrane, left ear: Secondary | ICD-10-CM

## 2015-04-13 DIAGNOSIS — Z8669 Personal history of other diseases of the nervous system and sense organs: Secondary | ICD-10-CM

## 2015-04-13 NOTE — Patient Instructions (Signed)
You should start to have some relief in the next few days from the steroid injection  I have referred you for an MRI, I will call you with the results. I have referred you to ENT as well, their office should call you.  Knee Injection, Care After Refer to this sheet in the next few weeks. These instructions provide you with information about caring for yourself after your procedure. Your health care provider may also give you more specific instructions. Your treatment has been planned according to current medical practices, but problems sometimes occur. Call your health care provider if you have any problems or questions after your procedure. WHAT TO EXPECT AFTER THE PROCEDURE After your procedure, it is common to have:  Soreness.  Warmth.  Swelling. You may have more pain, swelling, and warmth than you did before the injection. This reaction may last for about one day.  HOME CARE INSTRUCTIONS Bathing  If you were given a bandage (dressing), keep it dry until your health care provider says it can be removed. Ask your health care provider when you can start showering or taking a bath. Managing Pain, Stiffness, and Swelling  If directed, apply ice to the injection area:  Put ice in a plastic bag.  Place a towel between your skin and the bag.  Leave the ice on for 20 minutes, 2-3 times per day.  Do not apply heat to your knee.  Raise the injection area above the level of your heart while you are sitting or lying down. Activity  Avoid strenuous activities for as long as directed by your health care provider. Ask your health care provider when you can return to your normal activities. General Instructions  Take medicines only as directed by your health care provider.  Do not take aspirin or other over-the-counter medicines unless your health care provider says you can.  Check your injection site every day for signs of infection. Watch for:  Redness, swelling, or pain.  Fluid,  blood, or pus.  Follow your health care provider's instructions about dressing changes and removal. SEEK MEDICAL CARE IF:  You have symptoms at your injection site that last longer than two days after your procedure.  You have redness, swelling, or pain in your injection area.  You have fluid, blood, or pus coming from your injection site.  You have warmth in your injection area.  You have a fever.  Your pain is not controlled with medicine. SEEK IMMEDIATE MEDICAL CARE IF:  Your knee turns very red.  Your knee becomes very swollen.  Your knee pain is severe.   This information is not intended to replace advice given to you by your health care provider. Make sure you discuss any questions you have with your health care provider.   Document Released: 02/05/2014 Document Reviewed: 02/05/2014 Elsevier Interactive Patient Education [employer]2016 Elsevier Inc.

## 2015-04-13 NOTE — Progress Notes (Signed)
Patient ID: [name], female   DOB: March 24, 1980, 35 y.o.   MRN: [id number]    Subjective: CC: "annual exam"  HPI: Patient is a 35 y.o. female with a past medical history of bipolar disorder, asthma, lower back pain presenting to clinic today for what she states is simply an annual exam (but not a pap smear) but then notes a long list of issues, some of which we chose to talk about today.  Right knee pain: noticed it a few months ago. Notes she used to run 3-5 miles 4-5 times per week. She has stopped running since her last appt 1-2 weeks ago.  She has difficulty bending her knee. Not swollen or bruise. Pain is only on the medial aspect. It is sharp/shooting pain without radiation.  It is intermittent with bending. She feels like it is "bone on bone" and notes something gives out. No falling from injury. No pain with going up and down stairs. She denies any clicking sensation or feeling like her knee is popping.  No history of fall/injury that precipitated this. She has not tried anything for the pain.   Right lower back pain: calmed down after not running for the last week. Pain is sharp and shoots down towards her bottom. Pain is not constant.  Notices pain with picking her son up. She notes leaning over sometimes helps. No shooting pains. She tried heat patches that help temporarily for a few hours. No saddle paresthesias, bowel or urinary incontinence.   Left wrist pain: Continues to endorse pain is in the left thumb and 2nd digit. She notes occasional swelling in the thenar emimence. Patient notes she was diagnosed with carpal tunnel at a SDA but did not feel like this was similar to her previously carpal tunnel when she was pregnant. Pain is not worse with picking things (such as her toddler up). Pain currently gone but patient just wanted to talk about it.    Social History: never smoker  Health Maintenance: due for pap smear, patient to make appt with OB/gyn. Declines influenza  vaccine.   ROS: All other systems reviewed and are negative.  Past Medical History Patient Active Problem List   Diagnosis Date Noted  . De Quervain's tenosynovitis 04/15/2015  . Right knee pain 04/15/2015  . History of perforated ear drum 04/15/2015  . Irregular periods 09/14/2014  . Asthma with acute exacerbation 09/14/2014  . Skin lesion of breast 09/14/2014  . Thyroid nodule 09/14/2014  . Rectal bleeding 09/14/2014  . Bipolar disorder, curr episode mixed, severe, w/o psychotic features (HCC) 07/11/2014  . Cannabis use disorder, moderate, dependence (HCC) 07/11/2014  . Stimulant use disorder (HCC) 05/27/2014  . Overweight (BMI 25.0-29.9) 10/12/2013  . Yeast vaginitis 02/04/2013  . Malaise and fatigue 11/06/2012  . Low back pain 11/06/2012  . Menstrual cramp 03/24/2012  . Asthma 06/28/2010  . ADHD 06/13/2007    Medications- reviewed and updated Current Outpatient Prescriptions  Medication Sig Dispense Refill  . albuterol (PROAIR HFA) 108 (90 BASE) MCG/ACT inhaler Inhale 1 puff into the lungs every 4 (four) hours as needed. Dispense with spacer. For wheezing 2 Inhaler 1  . clonazePAM (KLONOPIN) 1 MG tablet Take 1 mg by mouth 2 (two) times daily as needed for anxiety.    Jae Dire. Elastic Bandages & Supports (WRIST SPLINT/COCK-UP/LEFT M) MISC Apply to left wrist as needed for pain. 1 each 0  . lamoTRIgine (LAMICTAL) 200 MG tablet Take 200 mg by mouth daily.    . nicotine polacrilex (  NICORETTE) 2 MG gum Take 1 each (2 mg total) by mouth as needed for smoking cessation. 100 tablet 0  . predniSONE (DELTASONE) 50 MG tablet Take one tablet daily for 5 days. (Patient not taking: Reported on 04/05/2015) 5 tablet 0  . traZODone (DESYREL) 100 MG tablet Take 1 tablet (100 mg total) by mouth at bedtime. For insomnia 30 tablet 0   No current facility-administered medications for this visit.    Objective: Office vital signs reviewed. BP 130/86 mmHg  Pulse 82  Temp(Src) 98 F (36.7 C) (Oral)   Ht  (1.651 m)  Wt 155 lb 14.4 oz (70.716 kg)  BMI 25.94 kg/m2  LMP 03/23/2015   Physical Examination:  General: Awake, alert, well nourished, NAD, very talkative ENMT: right TM TMs intact, normal light reflex, no erythema, no bulging. Left TM with scarring from previous rupture with some serous effusion, without erythema or effusion.  Nasal turbinates moist. MMM, Oropharynx clear without erythema or tonsillar exudate/hypertrophy Eyes: Conjunctiva non-injected. PERRL.  Extremities: No swelling, erythema, or effusion. Tenderness over the medial aspect of the knee. +McMurrary's., + Thessaly's. Good endpoints at the PCL and ACL.  Negative grind test. On left wrist exam no effusions, bruising.  +Phalens. Strength intact.  MSK: Normal gait and station.  Skin: dry, intact, no rashes or lesions Neuro: Strength and sensation grossly intact, DTRs 2/4  INJECTION: Patient was given informed consent, signed copy in the chart. Appropriate time out was taken. Area (right knee) prepped and draped in usual sterile fashion. 4 cc of methylprednisolone 40 mg/ml plus  1 cc of 1% lidocaine without epinephrine was injected into the knee joint space using a(n) anteromedial approach. The patient tolerated the procedure well. There were no complications. Post procedure instructions were given.   Assessment/Plan: De Quervain's tenosynovitis I suspect de quervain's tenosynovitis of the left wrist from exam. Currently no issues. Discussed using NSAIDs PRN.    Low back pain I suspect that this may be secondary to compensation from her knee pain. No red flags on exam. Will treat knee pain and follow up on back pain after treatment for knee.   Right knee pain Patient with R knee pain that has been present for several months. She is an avid runner. No history of fall or trauma. Initially suspected pes anserine bursitis, however give exam with McMurrays and Thessaly's concern for meniscal tear. Solid end points on  ligament exam. - steroid injection today - MRI of Right knee ordered  - patient to follow up with me in 1 month or sooner as needed  History of perforated ear drum Patient with scarring and serous effusion on exam. Patient continues to endorse pain. Requests referral for ENT. No evidence of infection currently. - referral to ENT   Patient evaluated and injection done with Dr. Jennette Kettle  Orders Placed This Encounter  Procedures  . MR Knee Right Wo Contrast    Standing Status: Future     Number of Occurrences:      Standing Expiration Date: 06/12/2016    Order Specific Question:  Reason for Exam (SYMPTOM  OR DIAGNOSIS REQUIRED)    Answer:  right knee pain, concerns for meniscal tear    Order Specific Question:  Preferred imaging location?    Answer:  [facility] (table limit-500 lbs)    Order Specific Question:  Does the patient have a pacemaker or implanted devices?    Answer:  No    Order Specific Question:  What is the patient's  sedation requirement?    Answer:  No Sedation  . Ambulatory referral to ENT    Referral Priority:  Routine    Referral Type:  Consultation    Referral Reason:  Specialty Services Required    Requested Specialty:  Otolaryngology    Number of Visits Requested:  1    No orders of the defined types were placed in this encounter.    Joanna Puff PGY-2, [facility]

## 2015-04-14 ENCOUNTER — Telehealth: Payer: Self-pay | Admitting: Family Medicine

## 2015-04-14 NOTE — Telephone Encounter (Signed)
Spoke to patient she would like to stay in [place]. [facility] and [facility] will contact pt to schedule.

## 2015-04-14 NOTE — Telephone Encounter (Signed)
LMOVM. Per Dr. Derek Moundorsey's note, [name] would like to see Dr. Suszanne Connerseoh. I called his office, he only sees Medicaid [name] in his [place] office. Would like to know if [name] is willing to travel to [place] to see Dr. Suszanne Connerseoh or would she like me to send referral to [facility] in [place]. Please let me know what patient would like to do.

## 2015-04-15 DIAGNOSIS — Z8669 Personal history of other diseases of the nervous system and sense organs: Secondary | ICD-10-CM | POA: Insufficient documentation

## 2015-04-15 DIAGNOSIS — M25561 Pain in right knee: Secondary | ICD-10-CM | POA: Insufficient documentation

## 2015-04-15 DIAGNOSIS — M654 Radial styloid tenosynovitis [de Quervain]: Secondary | ICD-10-CM | POA: Insufficient documentation

## 2015-04-15 NOTE — Assessment & Plan Note (Signed)
Patient with scarring and serous effusion on exam. Patient continues to endorse pain. Requests referral for ENT. No evidence of infection currently. - referral to ENT

## 2015-04-15 NOTE — Assessment & Plan Note (Signed)
I suspect that this may be secondary to compensation from her knee pain. No red flags on exam. Will treat knee pain and follow up on back pain after treatment for knee.

## 2015-04-15 NOTE — Assessment & Plan Note (Signed)
I suspect de quervain's tenosynovitis of the left wrist from exam. Currently no issues. Discussed using NSAIDs PRN.

## 2015-04-15 NOTE — Assessment & Plan Note (Signed)
Patient with R knee pain that has been present for several months. She is an avid runner. No history of fall or trauma. Initially suspected pes anserine bursitis, however give exam with McMurrays and Thessaly's concern for meniscal tear. Solid end points on ligament exam. - steroid injection today - MRI of Right knee ordered  - patient to follow up with me in 1 month or sooner as needed

## 2015-04-21 ENCOUNTER — Telehealth: Payer: Self-pay | Admitting: Family Medicine

## 2015-04-21 NOTE — Telephone Encounter (Signed)
Our office called and canceled the patient's MRI as Medicaid has denied it. We are currently attempting to appeal this decision. Patient re-scheduled her appt this AM. I called and informed the patient of the reason why this MRI was canceled and noted that if she were to go forward with the MRI without Medicaid's approval, she would most likely be responsible for the bill. The patient asked how much it would be out of pocket as she's still in pain, I noted it would be thousands of dollars.  Joanna Puffrystal S. Chanc Kervin, MD [facility] Resident  04/21/2015, 12:24 PM

## 2015-04-22 ENCOUNTER — Ambulatory Visit (HOSPITAL_COMMUNITY): Admission: RE | Admit: 2015-04-22 | Payer: Medicaid Other | Source: Ambulatory Visit

## 2015-04-22 ENCOUNTER — Ambulatory Visit (HOSPITAL_COMMUNITY): Payer: Medicaid Other

## 2015-04-22 ENCOUNTER — Telehealth: Payer: Self-pay | Admitting: Family Medicine

## 2015-04-22 DIAGNOSIS — M25561 Pain in right knee: Secondary | ICD-10-CM

## 2015-04-22 NOTE — Telephone Encounter (Signed)
Pt informed and scheduled for a lab appt. Ana Woodroof, CMA  

## 2015-04-22 NOTE — Telephone Encounter (Signed)
Spoke with Dr. Derrell LollingLantree with [employer], the patient's insurance company.   Please let the patient know that they will not approve a MRI at this time. Please ask the patient to come in during business hours to provide a urine sample to ensure she's not pregnant. If this is negative, I've ordered an X-ray of her knee that [facility] . Once I get the results from that imaging, we will discuss a plan of action that her insurance will allow.  Please have her rest that knee for now and ice it 15 minutes three times a day.    Thank you, Joanna Puffrystal S. Dorsey, MD [facility] Resident  04/22/2015, 2:08 PM

## 2015-04-25 ENCOUNTER — Other Ambulatory Visit: Payer: Self-pay

## 2015-04-25 ENCOUNTER — Other Ambulatory Visit: Payer: Self-pay | Admitting: Family Medicine

## 2015-04-28 ENCOUNTER — Ambulatory Visit: Payer: Self-pay | Admitting: Family Medicine

## 2015-05-02 ENCOUNTER — Telehealth: Payer: Self-pay | Admitting: Family Medicine

## 2015-05-02 ENCOUNTER — Other Ambulatory Visit (INDEPENDENT_AMBULATORY_CARE_PROVIDER_SITE_OTHER): Payer: Medicaid Other

## 2015-05-02 DIAGNOSIS — Z3200 Encounter for pregnancy test, result unknown: Secondary | ICD-10-CM

## 2015-05-02 LAB — POCT URINE PREGNANCY: Preg Test, Ur: NEGATIVE

## 2015-05-02 NOTE — Telephone Encounter (Signed)
Patient asks PCP to call her about some bruises she has on her right arm and pain on her right pain too. Please, follow up.

## 2015-05-03 NOTE — Telephone Encounter (Signed)
Attempted to call patient back, however it went to voicemail. I will attempt to call again, however if she calls back prior, please ask her to make a SDA if she's really concerned about the bruising and pain.  Thanks, Joanna Puffrystal S. Dorsey, MD [facility] Resident  05/03/2015, 11:06 AM

## 2015-05-04 NOTE — Telephone Encounter (Signed)
Attempted to call patient again. It went straight to voicemail. Did not leave a message due to HIPPA. If patient returns call, please ask her to provide a good phone number and time when she can be reached (as this is the 3rd time in 24hrs I have attempted to contact her). Additionally, if there is an emergency or if she is very worried about the bruising and pain (its getting worse) please have her come to be evaluated.   Thank you, Joanna Puffrystal S. Mats Jeanlouis, MD [facility] Resident  05/04/2015, 11:18 AM

## 2015-05-06 ENCOUNTER — Ambulatory Visit (INDEPENDENT_AMBULATORY_CARE_PROVIDER_SITE_OTHER): Payer: Medicaid Other | Admitting: Family Medicine

## 2015-05-06 ENCOUNTER — Encounter: Payer: Self-pay | Admitting: Family Medicine

## 2015-05-06 VITALS — BP 137/100 | HR 91 | Temp 97.4°F | Ht 65.0 in | Wt 149.0 lb

## 2015-05-06 DIAGNOSIS — T148XXA Other injury of unspecified body region, initial encounter: Secondary | ICD-10-CM | POA: Insufficient documentation

## 2015-05-06 DIAGNOSIS — Z8669 Personal history of other diseases of the nervous system and sense organs: Secondary | ICD-10-CM

## 2015-05-06 DIAGNOSIS — T148 Other injury of unspecified body region: Secondary | ICD-10-CM

## 2015-05-06 DIAGNOSIS — M25561 Pain in right knee: Secondary | ICD-10-CM | POA: Diagnosis not present

## 2015-05-06 LAB — CBC WITH DIFFERENTIAL/PLATELET
BASOS ABS: 0 {cells}/uL (ref 0–200)
Basophils Relative: 0 %
EOS ABS: 204 {cells}/uL (ref 15–500)
EOS PCT: 2 %
HCT: 41.2 % (ref 35.0–45.0)
HEMOGLOBIN: 13.9 g/dL (ref 11.7–15.5)
Lymphocytes Relative: 30 %
Lymphs Abs: 3060 cells/uL (ref 850–3900)
MCH: 30.5 pg (ref 27.0–33.0)
MCHC: 33.7 g/dL (ref 32.0–36.0)
MCV: 90.5 fL (ref 80.0–100.0)
MONO ABS: 408 {cells}/uL (ref 200–950)
MPV: 10.8 fL (ref 7.5–12.5)
Monocytes Relative: 4 %
NEUTROS ABS: 6528 {cells}/uL (ref 1500–7800)
Neutrophils Relative %: 64 %
PLATELETS: 236 10*3/uL (ref 140–400)
RBC: 4.55 MIL/uL (ref 3.80–5.10)
RDW: 12.9 % (ref 11.0–15.0)
WBC: 10.2 10*3/uL (ref 3.8–10.8)

## 2015-05-06 LAB — PROTIME-INR
INR: 1 (ref <1.50)
PROTHROMBIN TIME: 13.3 s (ref 11.6–15.2)

## 2015-05-06 NOTE — Patient Instructions (Signed)
It was nice to see you today.  Bruising - check labs today  Knee pain - check xrays today  Left ear perforation - continue Cipro drops. See ENT on Monday.

## 2015-05-06 NOTE — Assessment & Plan Note (Signed)
Patient reported some bleeding from the right ear. No current bleeding. No acute infection. -encouraged follow up with ENT -continue Ciprodex drops.

## 2015-05-06 NOTE — Assessment & Plan Note (Signed)
Follow up of right knee pain. Exam consistent with meniscal injury.  -encouraged patient to gets xray of knee performed today so that she can eventually get the MRI -continue Tylenol/NSAID's as needed

## 2015-05-06 NOTE — Assessment & Plan Note (Signed)
Bruising of right LE in various stages of healing. No injury. No other reported bleeding. -check CBC/PT/INR

## 2015-05-06 NOTE — Progress Notes (Signed)
   Subjective:    Patient ID: [name], female    DOB: 1980-11-16, 35 y.o.   MRN: [id number]  HPI 35 y/o female presents for follow up of right knee pain.   Right knee pain - previously evaluated by Dr. Leonides Schanzorsey and thought to be meniscal injury,  Continued pain mostly over the medial knee, has not gone to get xray yet (needed xray prior to MRI for insurance reasons), had steroid injection of right knee at last appointment with no relief of symptoms, minimal relief with Tylenol and NSAID's, pain mostly after running, no pain with walking  Bruising - mostly over the right leg, present for 4-5 days, will fade in a few days, no known injury, only other bleeding is from the right ear (see below).   Left ear perforation -  noticed blood from right ear over the past few days, recent perforated ear drum, has ENT appointment on 4/10, has been taking Ciprodex drops  Social - former smoker   Review of Systems  Constitutional: Negative for fever, chills and fatigue.  Gastrointestinal: Negative for nausea, vomiting, diarrhea and blood in stool.  Multiple other complaints (rash, mouth sore, fatigue) - not addressed today (patient counseled to discuss with PCP).      Objective:   Physical Exam BP 137/100 mmHg  Pulse 91  Temp(Src) 97.4 F (36.3 C) (Oral)  Ht 5\' 5"  (1.651 m)  Wt 149 lb (67.586 kg)  BMI 24.79 kg/m2  Gen: pleasant female, NAD HEENT: left TM -scaring present, small amount of dried blood in EAC, no active bleeding Skin: multiple areas of bruising in various stages of healing on right leg MSK: Right knee - no swelling, tenderness over medial aspect of knee, no crepitus with active ROM, lachman has good endpoint, valgus stress - mild pain, good endpoint, valgus stress - good endpoint, McMurray - pain over medial aspect of knee; Left knee - no swelling, no tenderness, ligament testing lachman/varus/valgus testing normal, no pain/crepitus with Mcmurray; Bilateral Hip - negative log  roll, no pain with FABER/FADIR; lumbar back - mild tenderness over right paraspinal area, no midline tenderness, negative SLT bilaterally      Assessment & Plan:  Right knee pain Follow up of right knee pain. Exam consistent with meniscal injury.  -encouraged patient to gets xray of knee performed today so that she can eventually get the MRI -continue Tylenol/NSAID's as needed  Bruising Bruising of right LE in various stages of healing. No injury. No other reported bleeding. -check CBC/PT/INR  History of perforated ear drum Patient reported some bleeding from the right ear. No current bleeding. No acute infection. -encouraged follow up with ENT -continue Ciprodex drops.   Patient had multiple other complaints that were not addressed at today's visit. I counseled the patient to follow up with her PCP for these issues.

## 2015-05-09 ENCOUNTER — Telehealth: Payer: Self-pay | Admitting: Family Medicine

## 2015-05-09 DIAGNOSIS — H6692 Otitis media, unspecified, left ear: Secondary | ICD-10-CM | POA: Insufficient documentation

## 2015-05-09 DIAGNOSIS — H7292 Unspecified perforation of tympanic membrane, left ear: Secondary | ICD-10-CM | POA: Insufficient documentation

## 2015-05-09 NOTE — Telephone Encounter (Signed)
Notified patient of normal PT/INR/CBC (workup for bruising).

## 2015-05-16 ENCOUNTER — Telehealth: Payer: Self-pay | Admitting: Family Medicine

## 2015-05-16 ENCOUNTER — Ambulatory Visit (HOSPITAL_COMMUNITY)
Admission: RE | Admit: 2015-05-16 | Discharge: 2015-05-16 | Disposition: A | Discharge status: Home / Self Care | Payer: Medicaid Other | Source: Ambulatory Visit | Attending: Family Medicine | Admitting: Family Medicine

## 2015-05-16 DIAGNOSIS — M25561 Pain in right knee: Secondary | ICD-10-CM | POA: Diagnosis not present

## 2015-05-16 NOTE — Telephone Encounter (Signed)
Called patient to inform her of her X-ray results. Patient continues to have significant pain in her R knee. She notes it sometimes hurts to put her pants on. She is not at the point where she can start straight leg raises. Stressed continued rest at this time. Discussed with patient that around April 26th, she would have been under "physican guided care" for this knee pain x 6 weeks. Patient will follow up around that time and if there is no improvement or worsening, at that time we will try to get insurance approval for her MRI as I suspect this is a meniscal injury . Patient to continue with NSAIDs and rest until that time.  Joanna Puffrystal S. Dorsey, MD [facility] Resident  05/16/2015, 9:22 AM

## 2015-05-16 NOTE — Telephone Encounter (Signed)
Error

## 2015-05-23 ENCOUNTER — Encounter: Payer: Self-pay | Admitting: Family Medicine

## 2015-05-23 ENCOUNTER — Ambulatory Visit (INDEPENDENT_AMBULATORY_CARE_PROVIDER_SITE_OTHER): Payer: Medicaid Other | Admitting: Family Medicine

## 2015-05-23 DIAGNOSIS — J019 Acute sinusitis, unspecified: Secondary | ICD-10-CM

## 2015-05-23 MED ORDER — DOXYCYCLINE HYCLATE 100 MG PO TABS: 100.0000 mg | ORAL_TABLET | Freq: Two times a day (BID) | ORAL | Status: DC

## 2015-05-23 NOTE — Progress Notes (Signed)
   Subjective:    Patient ID: [name], female    DOB: 04/14/1980, 35 y.o.   MRN: [id number]  Seen for Same day visit for   CC: Sinus pressure  She reports 6 days of sinus pressure, low-grade fevers, (MAXIMUM TEMPERATURE 101) , as well as headache, ear pain, nasal congestion and coughing.  She denies any chest pain, shortness of breath or palpitations.  She did not receive a flu shot this year.  Sick contacts and kids that were diagnosed with bronchitis within the past week.  Past history of allergies and asthma.  Using albuterol with spacer every 4-6 hours with minimal improvement in coughing.  Reports anaphylaxis with penicillins in the past.    Smoking history noted Review of Systems   See HPI for ROS. Objective:  BP 115/75 mmHg  Pulse 80  Temp(Src) 98.7 F (37.1 C) (Oral)  Ht 5\' 5"  (1.651 m)  Wt 161 lb 1.6 oz (73.074 kg)  BMI 26.81 kg/m2  SpO2 99%  LMP 05/16/2015  General: NAD HEENT: Pharyngeal erythema and cobblestoning, nasal turbinates swollen bilaterally, TMs clear bilaterally, no cervical adenopathy, tenderness to sinuses with palpation Cardiac: RRR, normal heart sounds, no murmurs.  Respiratory: CTAB, normal effort Skin: warm and dry, no rashes noted    Assessment & Plan:   1. Acute sinusitis, recurrence not specified, unspecified location - See AVS for symptomatically treatment and return precautions - doxycycline (VIBRA-TABS) 100 MG tablet; Take 1 tablet (100 mg total) by mouth 2 (two) times daily.  Dispense: 14 tablet; Refill: 0

## 2015-05-23 NOTE — Patient Instructions (Signed)
Your symptoms are due to a viral illness. The following will help you feel better while your body fights the virus.   Drink lots of water (Guaifenesin "Mucinex" as needed)  Nasal Saline Spray  Congestion:   Nose spray: Afrin (Phenylephrine). DO NOT USE MORE THAN 3 DAYS  Oral: Pseudoephedrine  Sneezing & Runny nose: Antihistamines: Zyrtec, Claritin, Allegra  Pain/Sore throat: Tylenol, Ibuprofen  Cough: Albuterol with spacer  Wash your hands often to prevent spreading the virus  Sinusitis, adultos (Sinusitis, Adult)  La sinusitis es la irritacin, dolor, e hinchazn (inflamacin) de las cavidades de aire en los huesos de la cara (senos paranasales). La irritacin, Chief Technology Officerel dolor, e hinchazn puede hacer que el aire y el moco se queden atrapados en los senos paranasales. Esto hace que los grmenes se multipliquen y causen una infeccin.  CUIDADOS EN EL HOGAR   Beba gran cantidad de lquido para mantener el pis (orina) de tono claro o amarillo plido.  Use un humidificador en su hogar.  Deje correr el agua caliente de la ducha para producir vapor en el bao. Sintese en el bao con la puerta cerrada. Inhale vapor de agua 3 a 4 veces al da.  Ponga un pao caliente y hmedo en el rostro 3 a 4 veces al da, o segn las indicaciones de su mdico.  Use aerosoles de agua salada (aerosoles de solucin salina) para Environmental education officerhumedecer las secreciones nasales espesas. Esto puede ayudar al drenaje de los senos paranasales.  Slo tome los medicamentos que le indique el mdico. SOLICITE AYUDA DE INMEDIATO SI:   El dolor empeora.  Siente un dolor de cabeza intenso.  Tiene Programme researcher, broadcasting/film/videomalestar estomacal (nuseas).  Vomita.  Tiene mucho sueo (somnolencia).  El rostro est inflamado (hinchado).  Hay cambios en la visin.  Presenta rigidez en el cuello.  Tiene dificultad para respirar. ASEGRESE DE QUE:   Comprende estas instrucciones.  Controlar su enfermedad.  Solicitar ayuda de inmediato si no  mejora o si empeora.   Esta informacin no tiene Theme park managercomo fin reemplazar el consejo del mdico. Asegrese de hacerle al mdico cualquier pregunta que tenga.   Document Released: 10/10/2011 Document Revised: 06/01/2014 Elsevier Interactive Patient Education [employer]2016 Elsevier Inc.

## 2015-05-24 ENCOUNTER — Ambulatory Visit: Payer: Self-pay | Admitting: Family Medicine

## 2015-05-27 ENCOUNTER — Ambulatory Visit: Payer: Self-pay | Admitting: Family Medicine

## 2015-07-05 DIAGNOSIS — H9012 Conductive hearing loss, unilateral, left ear, with unrestricted hearing on the contralateral side: Secondary | ICD-10-CM | POA: Insufficient documentation

## 2015-07-18 ENCOUNTER — Ambulatory Visit (INDEPENDENT_AMBULATORY_CARE_PROVIDER_SITE_OTHER): Payer: Medicaid Other | Admitting: Diagnostic Neuroimaging

## 2015-07-18 ENCOUNTER — Encounter: Payer: Self-pay | Admitting: Diagnostic Neuroimaging

## 2015-07-18 VITALS — BP 116/72 | HR 76 | Ht 65.0 in | Wt 151.2 lb

## 2015-07-18 DIAGNOSIS — R202 Paresthesia of skin: Principal | ICD-10-CM

## 2015-07-18 DIAGNOSIS — R208 Other disturbances of skin sensation: Secondary | ICD-10-CM | POA: Diagnosis not present

## 2015-07-18 DIAGNOSIS — F809 Developmental disorder of speech and language, unspecified: Secondary | ICD-10-CM

## 2015-07-18 DIAGNOSIS — R200 Anesthesia of skin: Secondary | ICD-10-CM | POA: Diagnosis not present

## 2015-07-18 NOTE — Patient Instructions (Signed)

## 2015-07-18 NOTE — Progress Notes (Signed)
[facility]  PATIENT: [name] DOB: [birth year]  REFERRING CLINICIAN: Pollyann Kennedy  HISTORY FROM: patient  REASON FOR VISIT: new consult    HISTORICAL  CHIEF COMPLAINT:  Chief Complaint  Patient presents with  . Left Hemihypesthesia aphasia, L hearing loss    rm 7, new pt, "my ENT believes my difficulty hearing in my left ear is more related to nerve damage; also he was concerned with speech"    HISTORY OF PRESENT ILLNESS:   35 year old ambidextrous female here for evaluation of left-sided numbness, speech difficulty, swallowing difficulty. 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  Patient reports 6 month history of intermittent left arm and left leg numbness. Numbness can last for a few minutes at a time. Sometimes left arm and left leg are affected at the same time. Sometimes left face is involved. No specific triggering or aggravating factors. On Mother's Day 2017 patient had left leg numbness and heaviness leading her to fall down a day. Symptoms last for a few minutes.  Patient also having 6 months of intermittent speech/language difficulty where she switches the order of her words into an incorrect grammar. She denies any slurred speech. Denies any word finding difficulty.  Patient has had ADHD diagnosis is eighth grade. She has tendency to talk off-topic, interrupted people and sometimes be perceived as "rude" even when that was not her intention. Symptoms have continued and she has been trying variety of different medication for management. She is seeing a psychiatrist as well.  Also patient having right knee pain medially, associated with running and physical activity. She is seen her PCP who diagnosed possible meniscus tear and is treating this conservatively.    REVIEW OF SYSTEMS: Full 14 system review of systems performed and negative with exception of: Fatigue numbness  weakness speech difficulty swallowing difficulty hearing loss trouble swallowing.    ALLERGIES: Allergies  Allergen Reactions  . Codeine Nausea And Vomiting  . Haldol [Haloperidol]     swelling  . Lamictal [Lamotrigine] Swelling    Of the face and throat  . Amoxicillin Rash  . Keflex [Cephalexin] Rash  . Penicillins Rash    HOME MEDICATIONS: Outpatient Prescriptions Prior to Visit  Medication Sig Dispense Refill  . albuterol (PROAIR HFA) 108 (90 BASE) MCG/ACT inhaler Inhale 1 puff into the lungs every 4 (four) hours as needed. Dispense with spacer. For wheezing 2 Inhaler 1  . clonazePAM (KLONOPIN) 1 MG tablet Take 1 mg by mouth 2 (two) times daily as needed for anxiety.    . lamoTRIgine (LAMICTAL) 200 MG tablet Take 200 mg by mouth daily. Reported on 05/23/2015    . VYVANSE 40 MG capsule   0  . cetirizine (ZYRTEC) 10 MG tablet Take 10 mg by mouth daily as needed for allergies. Reported on 07/18/2015    . traZODone (DESYREL) 100 MG tablet Take 1 tablet (100 mg total) by mouth at bedtime. For insomnia (Patient not taking: Reported on 07/18/2015) 30 tablet 0  . dextromethorphan-guaiFENesin (MUCINEX DM) 30-600 MG 12hr tablet Take 1 tablet by mouth 2 (two) times daily.    Marland Kitchen doxycycline (VIBRA-TABS) 100 MG tablet Take 1 tablet (100 mg total) by mouth 2 (two) times daily. 14 tablet 0  . nicotine polacrilex (NICORETTE) 2 MG gum Take 1 each (2 mg total) by mouth as needed for smoking cessation. (Patient not taking: Reported on 05/23/2015) 100 tablet 0  . predniSONE (DELTASONE) 50 MG tablet Take  one tablet daily for 5 days. (Patient not taking: Reported on 04/05/2015) 5 tablet 0   No facility-administered medications prior to visit.    PAST MEDICAL HISTORY: Past Medical History  Diagnosis Date  . Abnormal Pap smear   . Headache(784.0)   . Anemia     generalized anxiety disorder  . Anxiety   . PPH (postpartum hemorrhage)   . Asthma   . Blood transfusion     Pt delivery in 2009  .  Complication of anesthesia   . Spinal headache   . ADHD (attention deficit hyperactivity disorder)     PAST SURGICAL HISTORY: Past Surgical History  Procedure Laterality Date  . Tonsillectomy  2011    FAMILY HISTORY: Family History  Problem Relation Age of Onset  . Thrombophlebitis Mother   . Hypertension Father   . Heart disease Maternal Grandmother   . Heart disease Paternal Grandfather   . Hypertension Paternal Grandfather     SOCIAL HISTORY:  Social History   Social History  . Marital Status: Single    Spouse Name: N/A  . Number of Children: 2  . Years of Education: 18   Occupational History  .      home maker   Social History Main Topics  . Smoking status: Current Every Day Smoker -- 0.50 packs/day  . Smokeless tobacco: Never Used  . Alcohol Use: No  . Drug Use: Yes    Special: Marijuana, Amphetamines     Comment: 07/18/15  "smoked off and on since age 35"  . Sexual Activity: Not Currently     Comment: Pt currently pregnant   Other Topics Concern  . Not on file   Social History Narrative   Lives with mother and children    caffeine use- coffee 1 cup in morning     PHYSICAL EXAM  GENERAL EXAM/CONSTITUTIONAL: Vitals:  Filed Vitals:   07/18/15 0928  BP: 116/72  Pulse: 76  Height: 5\' 5"  (1.651 m)  Weight: 151 lb 3.2 oz (68.584 kg)     Body mass index is 25.16 kg/(m^2).  Visual Acuity Screening   Right eye Left eye Both eyes  Without correction: 20/30 20/70   With correction:        Patient is in no distress; well developed, nourished and groomed; neck is supple  CARDIOVASCULAR:  Examination of carotid arteries is normal; no carotid bruits  Regular rate and rhythm, no murmurs  Examination of peripheral vascular system by observation and palpation is normal  EYES:  Ophthalmoscopic exam of optic discs and posterior segments is normal; no papilledema or hemorrhages  MUSCULOSKELETAL:  Gait, strength, tone, movements noted in  Neurologic exam below  NEUROLOGIC: MENTAL STATUS:  No flowsheet data found.  awake, alert, oriented to person, place and time  recent and remote memory intact  normal attention and concentration  language fluent, comprehension intact, naming intact,   fund of knowledge appropriate  CRANIAL NERVE:   2nd - no papilledema on fundoscopic exam  2nd, 3rd, 4th, 6th - pupils equal and reactive to light, visual fields full to confrontation, extraocular muscles intact, no nystagmus  5th - facial sensation --> SLIGHTLY DECR ON LEFT SIDE  7th - facial strength symmetric  8th - hearing intact  9th - palate elevates symmetrically, uvula midline  11th - shoulder shrug symmetric  12th - tongue protrusion midline  MOTOR:   normal bulk and tone, full strength in the BUE, BLE  SENSORY:   normal and symmetric to light touch,  temperature, vibration; EXCEPT DECR VIB AND TEMP IN LEFT FACE, ARM, LEG  COORDINATION:   finger-nose-finger, fine finger movements normal  REFLEXES:   deep tendon reflexes present and symmetric  GAIT/STATION:   narrow based gait; able to walk tandem; romberg is negative    DIAGNOSTIC DATA (LABS, IMAGING, TESTING) - I reviewed patient records, labs, notes, testing and imaging myself where available.  Lab Results  Component Value Date   WBC 10.2 05/06/2015   HGB 13.9 05/06/2015   HCT 41.2 05/06/2015   MCV 90.5 05/06/2015   PLT 236 05/06/2015      Component Value Date/Time   NA 143 09/14/2014 1119   K 3.8 09/14/2014 1119   CL 104 09/14/2014 1119   CO2 30 09/14/2014 1119   GLUCOSE 102* 09/14/2014 1119   BUN 11 09/14/2014 1119   CREATININE 0.70 09/14/2014 1119   CREATININE 0.69 07/10/2014 1100   CREATININE 0.51 04/10/2007 2303   CALCIUM 9.4 09/14/2014 1119   PROT 6.6 09/14/2014 1119   ALBUMIN 4.2 09/14/2014 1119   AST 16 09/14/2014 1119   ALT 11 09/14/2014 1119   ALKPHOS 58 09/14/2014 1119   BILITOT 0.6 09/14/2014 1119   GFRNONAA >60  07/10/2014 1100   GFRAA >60 07/10/2014 1100   Lab Results  Component Value Date   CHOL 130 05/27/2014   HDL 30* 05/27/2014   LDLCALC 88 05/27/2014   TRIG 61 05/27/2014   CHOLHDL 4.3 05/27/2014   Lab Results  Component Value Date   HGBA1C 5.7* 05/27/2014   No results found for: VITAMINB12 Lab Results  Component Value Date   TSH 0.439 09/14/2014   07/13/14 EKG [I reviewed images myself and agree with interpretation. -VRP]  - normal sinus rhythm   05/16/15 Right knee xray [I reviewed images myself and agree with interpretation. -VRP]  1. No acute findings. 2. 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    ASSESSMENT AND PLAN  36 y.o. year old female here with left face arm and leg numbness and tingling, word finding language difficulty, intermittent over the last 6 months. We'll check MRI brain for further evaluation.   Dx:  1. Numbness and tingling in left arm   2. Numbness in left leg   3. Numbness and tingling of left side of face   4. Language difficulty      PLAN:  Orders Placed This Encounter  Procedures  . MR Brain W Wo Contrast   Return in about 6 weeks (around 08/29/2015).  I reviewed labs, notes, records myself. I summarized findings and reviewed with patient, for this high risk condition (left sided numbness; focal neurologic symptoms) requiring high complexity decision making.     Suanne Marker, MD 07/18/2015, 10:28 AM Certified in Neurology, Neurophysiology and Neuroimaging  [facility] [address] [place], [place] [address] [phone]

## 2015-08-01 ENCOUNTER — Inpatient Hospital Stay: Admission: RE | Admit: 2015-08-01 | Payer: Self-pay | Source: Ambulatory Visit

## 2015-08-15 ENCOUNTER — Ambulatory Visit
Admission: RE | Admit: 2015-08-15 | Discharge: 2015-08-15 | Disposition: A | Discharge status: Home / Self Care | Payer: Medicaid Other | Source: Ambulatory Visit | Attending: Diagnostic Neuroimaging | Admitting: Diagnostic Neuroimaging

## 2015-08-15 DIAGNOSIS — R200 Anesthesia of skin: Secondary | ICD-10-CM

## 2015-08-15 DIAGNOSIS — R208 Other disturbances of skin sensation: Secondary | ICD-10-CM | POA: Diagnosis not present

## 2015-08-15 DIAGNOSIS — R202 Paresthesia of skin: Principal | ICD-10-CM

## 2015-08-15 MED ORDER — GADOBENATE DIMEGLUMINE 529 MG/ML IV SOLN
15.0000 mL | Freq: Once | INTRAVENOUS | Status: AC | PRN
  Administered 2015-08-15: 15 mL via INTRAVENOUS

## 2015-08-15 MED ORDER — DIPHENHYDRAMINE HCL 50 MG/ML IJ SOLN
25.0000 mg | Freq: Once | INTRAMUSCULAR | Status: AC
  Administered 2015-08-15: 25 mg via INTRAVENOUS

## 2015-08-15 NOTE — Progress Notes (Addendum)
1218 pm Pt came to nursing area from MRI with complaints of wheezing, itching, red eyes and a couple of hives on abdomen. Dr. Karin GoldenShogry in to speak with pt and ordered benadryl to be administered.  1220 pm Benadryl 25 mg IV giver thru her existing IV in left arm. 10 minutes later all symptoms have resolved, IV d/cd and pt released to her mother to drive her home.

## 2015-08-17 ENCOUNTER — Telehealth: Payer: Self-pay | Admitting: *Deleted

## 2015-08-17 NOTE — Telephone Encounter (Signed)
Per Dr Terrace ArabiaYan, spoke with patient and informed her that her MRI brain results are normal. She stated she had seen those results on her my chart; she verbalized understanding, appreciation and confirmed her FU on 08/29/15.

## 2015-08-29 ENCOUNTER — Ambulatory Visit (INDEPENDENT_AMBULATORY_CARE_PROVIDER_SITE_OTHER): Payer: Medicaid Other | Admitting: Diagnostic Neuroimaging

## 2015-08-29 ENCOUNTER — Encounter: Payer: Self-pay | Admitting: Diagnostic Neuroimaging

## 2015-08-29 VITALS — BP 104/69 | HR 76 | Wt 147.8 lb

## 2015-08-29 DIAGNOSIS — F809 Developmental disorder of speech and language, unspecified: Secondary | ICD-10-CM

## 2015-08-29 DIAGNOSIS — R208 Other disturbances of skin sensation: Secondary | ICD-10-CM | POA: Diagnosis not present

## 2015-08-29 DIAGNOSIS — R202 Paresthesia of skin: Principal | ICD-10-CM

## 2015-08-29 DIAGNOSIS — R200 Anesthesia of skin: Secondary | ICD-10-CM | POA: Diagnosis not present

## 2015-08-29 NOTE — Progress Notes (Signed)
[facility]  PATIENT: [name] DOB: 06/22/1980  REFERRING CLINICIAN: Pollyann Kennedy  HISTORY FROM: patient  REASON FOR VISIT: follow up    HISTORICAL  CHIEF COMPLAINT:  Chief Complaint  Patient presents with  . Numbness    rm 7, numbness/tingling L arm- no better; numbness occas both legs- seems like it takes a long time for sensation to come back"  . Follow-up    6 week    HISTORY OF PRESENT ILLNESS:   UPDATE 08/29/15: Since last visit, doing about the same. Speech, left arm and left leg sxs are stable. Right knee pain stable.   PRIOR HPI (07/18/15): 35 year old ambidextrous female here for evaluation of left-sided numbness, speech difficulty, swallowing difficulty. 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 Patient reports 6 month history of intermittent left arm and left leg numbness. Numbness can last for a few minutes at a time. Sometimes left arm and left leg are affected at the same time. Sometimes left face is involved. No specific triggering or aggravating factors. On Mother's Day 2017 patient had left leg numbness and heaviness leading her to fall down a day. Symptoms last for a few minutes. Patient also having 6 months of intermittent speech/language difficulty where she switches the order of her words into an incorrect grammar. She denies any slurred speech. Denies any word finding difficulty. Patient has had ADHD diagnosis is eighth grade. She has tendency to talk off-topic, interrupted people and sometimes be perceived as "rude" even when that was not her intention. Symptoms have continued and she has been trying variety of different medication for management. She is seeing a psychiatrist as well. Also patient having right knee pain medially, associated with running and physical activity. She is seen her PCP who diagnosed possible meniscus tear and is treating this  conservatively.    REVIEW OF SYSTEMS: Full 14 system review of systems performed and negative with exception of: numbness eye pain runny nose ear discharge neck pain.    ALLERGIES: Allergies  Allergen Reactions  . Codeine Nausea And Vomiting  . Gadolinium Derivatives Hives and Swelling    Pt had sneezing, swelling and itchiness of eyes and hives after gadolinium//lh  . Haldol [Haloperidol]     swelling  . Lamictal [Lamotrigine] Swelling    Of the face and throat  . Amoxicillin Rash  . Keflex [Cephalexin] Rash  . Penicillins Rash    HOME MEDICATIONS: Outpatient Medications Prior to Visit  Medication Sig Dispense Refill  . albuterol (PROAIR HFA) 108 (90 BASE) MCG/ACT inhaler Inhale 1 puff into the lungs every 4 (four) hours as needed. Dispense with spacer. For wheezing 2 Inhaler 1  . cetirizine (ZYRTEC) 10 MG tablet Take 10 mg by mouth daily as needed for allergies. Reported on 07/18/2015    . clonazePAM (KLONOPIN) 1 MG tablet Take 1 mg by mouth 2 (two) times daily as needed for anxiety.    . lamoTRIgine (LAMICTAL) 200 MG tablet Take 200 mg by mouth daily. Reported on 05/23/2015    . traZODone (DESYREL) 100 MG tablet Take 1 tablet (100 mg total) by mouth at bedtime. For insomnia 30 tablet 0  . VYVANSE 40 MG capsule   0   No facility-administered medications prior to visit.     PAST MEDICAL HISTORY: Past Medical History:  Diagnosis Date  . Abnormal Pap smear   . ADHD (attention deficit hyperactivity disorder)   . Anemia    generalized anxiety  disorder  . Anxiety   . Asthma   . Blood transfusion    Pt delivery in 2009  . Complication of anesthesia   . Headache(784.0)   . PPH (postpartum hemorrhage)   . Spinal headache     PAST SURGICAL HISTORY: Past Surgical History:  Procedure Laterality Date  . TONSILLECTOMY  2011    FAMILY HISTORY: Family History  Problem Relation Age of Onset  . Thrombophlebitis Mother   . Hypertension Father   . Heart disease Maternal  Grandmother   . Heart disease Paternal Grandfather   . Hypertension Paternal Grandfather     SOCIAL HISTORY:  Social History   Social History  . Marital status: Single    Spouse name: N/A  . Number of children: 2  . Years of education: 75   Occupational History  .      home maker   Social History Main Topics  . Smoking status: Current Every Day Smoker    Packs/day: 0.50  . Smokeless tobacco: Never Used  . Alcohol use No  . Drug use:     Types: Marijuana, Amphetamines     Comment: 07/18/15  "smoked off and on since age 22"  . Sexual activity: Not Currently     Comment: Pt currently pregnant   Other Topics Concern  . Not on file   Social History Narrative   Lives with mother and children    caffeine use- coffee 1 cup in morning     PHYSICAL EXAM  GENERAL EXAM/CONSTITUTIONAL: Vitals:  Vitals:   08/29/15 1341  BP: 104/69  Pulse: 76  Weight: 147 lb 12.8 oz (67 kg)   Body mass index is 24.6 kg/m. No exam data present  Patient is in no distress; well developed, nourished and groomed; neck is supple  CARDIOVASCULAR:  Examination of carotid arteries is normal; no carotid bruits  Regular rate and rhythm, no murmurs  Examination of peripheral vascular system by observation and palpation is normal  EYES:  Ophthalmoscopic exam of optic discs and posterior segments is normal; no papilledema or hemorrhages  MUSCULOSKELETAL:  Gait, strength, tone, movements noted in Neurologic exam below  NEUROLOGIC: MENTAL STATUS:  No flowsheet data found.  awake, alert, oriented to person, place and time  recent and remote memory intact  normal attention and concentration  language fluent, comprehension intact, naming intact,   fund of knowledge appropriate  CRANIAL NERVE:   2nd - no papilledema on fundoscopic exam  2nd, 3rd, 4th, 6th - pupils equal and reactive to light, visual fields full to confrontation, extraocular muscles intact, no nystagmus  5th -  facial sensation --> SLIGHTLY DECR ON LEFT SIDE  7th - facial strength symmetric  8th - hearing intact  9th - palate elevates symmetrically, uvula midline  11th - shoulder shrug symmetric  12th - tongue protrusion midline  MOTOR:   normal bulk and tone, full strength in the BUE, BLE  SENSORY:   normal and symmetric to light touch, temperature, vibration; EXCEPT DECR VIB AND TEMP IN LEFT FACE, ARM, LEG  COORDINATION:   finger-nose-finger, fine finger movements normal  REFLEXES:   deep tendon reflexes present and symmetric  GAIT/STATION:   narrow based gait; able to walk tandem; romberg is negative    DIAGNOSTIC DATA (LABS, IMAGING, TESTING) - I reviewed patient records, labs, notes, testing and imaging myself where available.  Lab Results  Component Value Date   WBC 10.2 05/06/2015   HGB 13.9 05/06/2015   HCT 41.2 05/06/2015  MCV 90.5 05/06/2015   PLT 236 05/06/2015      Component Value Date/Time   NA 143 09/14/2014 1119   K 3.8 09/14/2014 1119   CL 104 09/14/2014 1119   CO2 30 09/14/2014 1119   GLUCOSE 102 (H) 09/14/2014 1119   BUN 11 09/14/2014 1119   CREATININE 0.70 09/14/2014 1119   CALCIUM 9.4 09/14/2014 1119   PROT 6.6 09/14/2014 1119   ALBUMIN 4.2 09/14/2014 1119   AST 16 09/14/2014 1119   ALT 11 09/14/2014 1119   ALKPHOS 58 09/14/2014 1119   BILITOT 0.6 09/14/2014 1119   GFRNONAA >60 07/10/2014 1100   GFRAA >60 07/10/2014 1100   Lab Results  Component Value Date   CHOL 130 05/27/2014   HDL 30 (L) 05/27/2014   LDLCALC 88 05/27/2014   TRIG 61 05/27/2014   CHOLHDL 4.3 05/27/2014   Lab Results  Component Value Date   HGBA1C 5.7 (H) 05/27/2014   No results found for: VITAMINB12 Lab Results  Component Value Date   TSH 0.439 09/14/2014   07/13/14 EKG [I reviewed images myself and agree with interpretation. -VRP]  - normal sinus rhythm   05/16/15 right knee xray [I reviewed images myself and agree with interpretation. -VRP]  1. No  acute findings. 2. 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    ASSESSMENT AND PLAN  35 y.o. year old female here with left face arm and leg numbness and tingling, word finding language difficulty, intermittent over the since early 2017. MRI brain was unremarkable.    Ddx: intermittent nerve compression / irritation  1. Numbness and tingling in left arm   2. Numbness in left leg   3. Numbness and tingling of left side of face   4. Language difficulty      PLAN: - conservative mgmt; monitor symptoms  Return if symptoms worsen or fail to improve, for return to PCP.    Suanne Marker, MD 08/29/2015, 2:07 PM Certified in Neurology, Neurophysiology and Neuroimaging  [facility] [address] [place], [place] [address] [phone]

## 2015-08-29 NOTE — Patient Instructions (Signed)
-   practice verbal language output (talking out loud, board games, singing)  - monitor symptoms

## 2015-09-09 ENCOUNTER — Ambulatory Visit: Payer: Self-pay | Admitting: Family Medicine

## 2015-09-23 ENCOUNTER — Ambulatory Visit: Payer: Self-pay | Admitting: Family Medicine

## 2015-11-22 ENCOUNTER — Ambulatory Visit: Payer: Self-pay | Admitting: Family Medicine

## 2016-01-16 ENCOUNTER — Encounter: Payer: Self-pay | Admitting: Family Medicine

## 2016-01-16 DIAGNOSIS — Z91199 Patient's noncompliance with other medical treatment and regimen due to unspecified reason: Secondary | ICD-10-CM | POA: Insufficient documentation

## 2016-01-16 DIAGNOSIS — Z5329 Procedure and treatment not carried out because of patient's decision for other reasons: Secondary | ICD-10-CM

## 2016-01-16 NOTE — Progress Notes (Signed)
Subjective  Patient is presenting with the following illnesses     Chief Complaint noted Review of Symptoms - see HPI PMH - Smoking status noted.     Objective Vital Signs reviewed     Assessments/Plans  No problem-specific Assessment & Plan notes found for this encounter.   See Encounter view if individual problem A/Ps not visible See after visit summary for details of patient instuctions 

## 2016-01-19 ENCOUNTER — Ambulatory Visit (INDEPENDENT_AMBULATORY_CARE_PROVIDER_SITE_OTHER): Payer: Medicaid Other | Admitting: Family Medicine

## 2016-01-19 ENCOUNTER — Encounter: Payer: Self-pay | Admitting: Family Medicine

## 2016-01-19 VITALS — BP 102/60 | HR 84 | Ht 65.0 in | Wt 137.0 lb

## 2016-01-19 DIAGNOSIS — H60392 Other infective otitis externa, left ear: Secondary | ICD-10-CM | POA: Diagnosis present

## 2016-01-19 MED ORDER — NEOMYCIN-COLIST-HC-THONZONIUM 3.3-3-10-0.5 MG/ML OT SUSP: 5.0000 [drp] | Freq: Four times a day (QID) | OTIC | 0 refills | Status: DC

## 2016-01-19 MED ORDER — DOXYCYCLINE HYCLATE 100 MG PO TABS: 100.0000 mg | ORAL_TABLET | Freq: Two times a day (BID) | ORAL | 0 refills | Status: DC

## 2016-01-19 NOTE — Patient Instructions (Signed)
Start the cortisporin drops. Stop the ciprodex drops. Start the doxycycline.  Let us know if it gets worse or does not get better.  Take care,  Dr Jimmey RalphParker

## 2016-01-19 NOTE — Progress Notes (Signed)
    Subjective:  [name] is a 35 y.o. female who [facility] with a chief complaint of Ear Pain.   HPI:  Ear Pain Patient with a history of perforated left ear drum and has been seen by ENT in the past. Yesterday she started noting severe left ear pain. [facility] that numbed it and she was also given a prescription for ciprodex. Patient has not noticed much of an improvement. She feels like it is inflammaed. Her ear is very tender to the touch. She has been taking 800mg  ibuprofen every 6-7 hours for the pain. No trauma or other obcious precipitating event.   ROS: Per HPI  PMH: Smoking history reviewed.    Objective:  Physical Exam: BP 102/60   Pulse 84   Ht 5\' 5"  (1.651 m)   Wt 137 lb (62.1 kg)   LMP 01/15/2016 (Exact Date)   BMI 22.80 kg/m   Gen: NAD, resting comfortably HEENT: Right TM clear. Left external ear canal with significant amount of purulent debris. TM not visualized. Very tender on movement of left pinna Pulm: NWOB MSK: no edema, cyanosis, or clubbing noted Skin: warm, dry Neuro: grossly normal, moves all extremities Psych: Normal affect and thought content  Assessment/Plan:  Left Otitis Externa Difficult to visualize TM given patient's tenderness and amount of purulent debris. Instilled 4 drops of proparacaine today which improved her pain some. No signs of systemic illness. Given her severe pain and complex history, will add on doxycycline 100mg  bid (patient allergic to amoxicillin). Will also change ear drop to cortisporin per her request. Strict return precautions reviewed. Follow up as needed. If not improving, would consider referral to ENT.   Katina Degreealeb M. Jimmey RalphParker, MD [facility] Resident PGY-3 01/19/2016 10:28 AM

## 2016-01-25 DIAGNOSIS — H903 Sensorineural hearing loss, bilateral: Secondary | ICD-10-CM | POA: Insufficient documentation

## 2016-02-17 ENCOUNTER — Telehealth: Payer: Self-pay | Admitting: *Deleted

## 2016-02-17 NOTE — Telephone Encounter (Signed)
[facility] Coly-Mycin-S otic. Formulary and PA form placed in provider box for completion. Clovis PuMartin, Noble Bodie L, RN

## 2016-02-20 NOTE — Telephone Encounter (Signed)
Received prior authorization for Coly-Mycin- S Otic. Formulary alternative Cortisporin was sent in last month. If patient is still having ear pain she needs to be seen.  Katina Degreealeb M. Jimmey RalphParker, MD [facility] Resident PGY-3 02/20/2016 3:25 PM

## 2016-02-21 ENCOUNTER — Encounter: Payer: Self-pay | Admitting: *Deleted

## 2016-02-21 NOTE — Telephone Encounter (Signed)
Tried to call patient but was unable to LM due to VM not picking up.  Will also send a mychart message to patient. Jazmin Hartsell,CMA

## 2016-04-05 ENCOUNTER — Encounter: Payer: Self-pay | Admitting: Family Medicine

## 2016-04-05 ENCOUNTER — Ambulatory Visit (INDEPENDENT_AMBULATORY_CARE_PROVIDER_SITE_OTHER): Payer: Medicaid Other | Admitting: Family Medicine

## 2016-04-05 VITALS — BP 102/70 | HR 88 | Temp 98.0°F | Ht 65.0 in | Wt 150.0 lb

## 2016-04-05 DIAGNOSIS — Z30019 Encounter for initial prescription of contraceptives, unspecified: Secondary | ICD-10-CM | POA: Diagnosis not present

## 2016-04-05 DIAGNOSIS — Z30011 Encounter for initial prescription of contraceptive pills: Secondary | ICD-10-CM

## 2016-04-05 DIAGNOSIS — Z309 Encounter for contraceptive management, unspecified: Secondary | ICD-10-CM | POA: Insufficient documentation

## 2016-04-05 LAB — POCT URINE PREGNANCY: PREG TEST UR: NEGATIVE

## 2016-04-05 MED ORDER — LEVONORGESTREL-ETHINYL ESTRAD 0.15-30 MG-MCG PO TABS: 1.0000 | ORAL_TABLET | Freq: Every day | ORAL | 11 refills | Status: DC

## 2016-04-05 NOTE — Patient Instructions (Signed)
It was good to see you again. I have prescribed birth control, you can start it this Sunday. Remember to take it daily. If you miss more than 1 day you can become pregnant Oral contraceptions do not protect against STDs.   Oral Contraception Information Oral contraceptive pills (OCPs) are medicines taken to prevent pregnancy. OCPs work by preventing the ovaries from releasing eggs. The hormones in OCPs also cause the cervical mucus to thicken, preventing the sperm from entering the uterus. The hormones also cause the uterine lining to become thin, not allowing a fertilized egg to attach to the inside of the uterus. OCPs are highly effective when taken exactly as prescribed. However, OCPs do not prevent sexually transmitted diseases (STDs). Safe sex practices, such as using condoms along with the pill, can help prevent STDs. Before taking the pill, you may have a physical exam and Pap test. Your health care provider may order blood tests. The health care provider will make sure you are a good candidate for oral contraception. Discuss with your health care provider the possible side effects of the OCP you may be prescribed. When starting an OCP, it can take 2 to 3 months for the body to adjust to the changes in hormone levels in your body. Types of oral contraception  The combination pill-This pill contains estrogen and progestin (synthetic progesterone) hormones. The combination pill comes in 21-day, 28-day, or 91-day packs. Some types of combination pills are meant to be taken continuously (365-day pills). With 21-day packs, you do not take pills for 7 days after the last pill. With 28-day packs, the pill is taken every day. The last 7 pills are without hormones. Certain types of pills have more than 21 hormone-containing pills. With 91-day packs, the first 84 pills contain both hormones, and the last 7 pills contain no hormones or contain estrogen only.  The minipill-This pill contains the progesterone  hormone only. The pill is taken every day continuously. It is very important to take the pill at the same time each day. The minipill comes in packs of 28 pills. All 28 pills contain the hormone. Advantages of oral contraceptive pills  Decreases premenstrual symptoms.  Treats menstrual period cramps.  Regulates the menstrual cycle.  Decreases a heavy menstrual flow.  May treatacne, depending on the type of pill.  Treats abnormal uterine bleeding.  Treats polycystic ovarian syndrome.  Treats endometriosis.  Can be used as emergency contraception. Things that can make oral contraceptive pills less effective OCPs can be less effective if:  You forget to take the pill at the same time every day.  You have a stomach or intestinal disease that lessens the absorption of the pill.  You take OCPs with other medicines that make OCPs less effective, such as antibiotics, certain HIV medicines, and some seizure medicines.  You take expired OCPs.  You forget to restart the pill on day 7, when using the packs of 21 pills. Risks associated with oral contraceptive pills Oral contraceptive pills can sometimes cause side effects, such as:  Headache.  Nausea.  Breast tenderness.  Irregular bleeding or spotting. Combination pills are also associated with a small increased risk of:  Blood clots.  Heart attack.  Stroke. This information is not intended to replace advice given to you by your health care provider. Make sure you discuss any questions you have with your health care provider. Document Released: 04/07/2002 Document Revised: 06/23/2015 Document Reviewed: 07/06/2012 Elsevier Interactive Patient Education  2017 [employer]

## 2016-04-05 NOTE — Assessment & Plan Note (Signed)
The patient is presenting for contraception. She is not interested in LARCs. She's done well on OCPs in the past and states that she will remember to take them daily. No contraindications to oral contraception. We discussed use of condoms to protect against sexually transmitted diseases.  Urine pregnancy negative today. -Prescribed Levora -Advised patient to follow-up for an annual exam with Pap smear.

## 2016-04-05 NOTE — Progress Notes (Signed)
poc

## 2016-04-05 NOTE — Progress Notes (Signed)
    Subjective: CC: birth control  HPI: Patient is a 36 y.o. female with a past medical history of bipolar, h/o perforated ear drum, irregular periods presenting to clinic today for contraception.  She states that she hopes to start on oral contraception. She's been on it in the past and did well. She noted facial hair which she states OCPs have helped in the past. She also notes heavy, irregular periods. She is currently on her menses, LMP 04/03/16. She denies a history of migraines. She is a nonsmoker. No history of clot disorders or cardiac history.  She is currently not sexually active , but she plans to become sexually active with one partner. She is not interested in LARCs.   Social History: non-smoker  Flu Vaccine: declined previously   ROS: All other systems reviewed and are negative. Of note, her knee pain has improved. Her ear pain has also improved.  Past Medical History Patient Active Problem List   Diagnosis Date Noted  . Contraception management 04/05/2016  . Frequent No-show for appointment 01/16/2016  . Bruising 05/06/2015  . De Quervain's tenosynovitis 04/15/2015  . Right knee pain 04/15/2015  . History of perforated ear drum 04/15/2015  . Irregular periods 09/14/2014  . Asthma with acute exacerbation 09/14/2014  . Skin lesion of breast 09/14/2014  . Thyroid nodule 09/14/2014  . Rectal bleeding 09/14/2014  . Bipolar disorder, curr episode mixed, severe, w/o psychotic features (HCC) 07/11/2014  . Cannabis use disorder, moderate, dependence (HCC) 07/11/2014  . Stimulant use disorder 05/27/2014  . Overweight (BMI 25.0-29.9) 10/12/2013  . Yeast vaginitis 02/04/2013  . Malaise and fatigue 11/06/2012  . Low back pain 11/06/2012  . Menstrual cramp 03/24/2012  . Asthma 06/28/2010  . ADHD 06/13/2007    Medications- reviewed  Objective: Office vital signs reviewed. BP 102/70   Pulse 88   Temp 98 F (36.7 C) (Oral)   Ht 5\' 5"  (1.651 m)   Wt 150 lb (68 kg)   LMP  04/03/2016   BMI 24.96 kg/m    Physical Examination:  General: Awake, alert, well- nourished, NAD Cardio: RRR, no m/r/g noted.  Pulm: No increased WOB.  CTAB, without wheezes, rhonchi or crackles noted.  GI: +BS, soft, non-distended, non-tender  Urine pregnancy negative   Assessment/Plan: Contraception management The patient is presenting for contraception. She is not interested in LARCs. She's done well on OCPs in the past and states that she will remember to take them daily. No contraindications to oral contraception. We discussed use of condoms to protect against sexually transmitted diseases.  Urine pregnancy negative today. -Prescribed Levora -Advised patient to follow-up for an annual exam with Pap smear.   Orders Placed This Encounter  Procedures  . POCT urine pregnancy    Meds ordered this encounter  Medications  . levonorgestrel-ethinyl estradiol (LEVORA 0.15/30, 28,) 0.15-30 MG-MCG tablet    Sig: Take 1 tablet by mouth daily.    Dispense:  1 Package    Refill:  11    Joanna Puffrystal S. Dorsey PGY-3, [facility]

## 2016-05-31 ENCOUNTER — Ambulatory Visit: Payer: Medicaid Other | Admitting: Family Medicine

## 2016-06-18 NOTE — Progress Notes (Signed)
Cancelling order as lab not collected, no need to re-order 

## 2016-07-02 ENCOUNTER — Ambulatory Visit (INDEPENDENT_AMBULATORY_CARE_PROVIDER_SITE_OTHER): Payer: Medicaid Other | Admitting: Family Medicine

## 2016-07-02 ENCOUNTER — Encounter: Payer: Self-pay | Admitting: Family Medicine

## 2016-07-02 ENCOUNTER — Other Ambulatory Visit (HOSPITAL_COMMUNITY)
Admission: RE | Admit: 2016-07-02 | Discharge: 2016-07-02 | Disposition: A | Discharge status: Home / Self Care | Payer: Medicaid Other | Source: Ambulatory Visit | Attending: Family Medicine | Admitting: Family Medicine

## 2016-07-02 VITALS — BP 128/88 | HR 83 | Temp 98.4°F | Wt 156.0 lb

## 2016-07-02 DIAGNOSIS — N760 Acute vaginitis: Secondary | ICD-10-CM | POA: Insufficient documentation

## 2016-07-02 DIAGNOSIS — Z124 Encounter for screening for malignant neoplasm of cervix: Secondary | ICD-10-CM

## 2016-07-02 DIAGNOSIS — B9689 Other specified bacterial agents as the cause of diseases classified elsewhere: Secondary | ICD-10-CM | POA: Insufficient documentation

## 2016-07-02 DIAGNOSIS — N898 Other specified noninflammatory disorders of vagina: Secondary | ICD-10-CM | POA: Diagnosis not present

## 2016-07-02 DIAGNOSIS — Z0000 Encounter for general adult medical examination without abnormal findings: Secondary | ICD-10-CM

## 2016-07-02 LAB — POCT WET PREP (WET MOUNT)
Clue Cells Wet Prep Whiff POC: POSITIVE
Trichomonas Wet Prep HPF POC: ABSENT

## 2016-07-02 MED ORDER — METRONIDAZOLE 500 MG PO TABS: 500.0000 mg | ORAL_TABLET | Freq: Two times a day (BID) | ORAL | 0 refills | Status: DC

## 2016-07-02 MED ORDER — ALBUTEROL SULFATE HFA 108 (90 BASE) MCG/ACT IN AERS: 1.0000 | INHALATION_SPRAY | RESPIRATORY_TRACT | 1 refills | Status: DC | PRN

## 2016-07-02 NOTE — Patient Instructions (Signed)

## 2016-07-02 NOTE — Assessment & Plan Note (Signed)
History, exam, and wet prep consistent with bacterial vaginosis. - Flagyl 500mg  BID x 7 days. Discussed disulfiram reaction.

## 2016-07-02 NOTE — Progress Notes (Signed)
Subjective: CC: foul vaginal odor HPI: Patient is a 36 y.o. female presenting to clinic today for a same day appt for vaginal odor.  Vaginal odor Present since starting OCPs Odor is "fishy" No discharge No vulvar pruritus No concerns for STDs, getting married in December 2018.   Has spotted once since OCPs, no true periods any more. Overall loves OCPs  Social History: nonsmoker   ROS: All other systems reviewed and are negative.  Past Medical History Patient Active Problem List   Diagnosis Date Noted  . Bacterial vaginosis 07/02/2016  . Health care maintenance 07/02/2016  . Contraception management 04/05/2016  . Frequent No-show for appointment 01/16/2016  . Bruising 05/06/2015  . De Quervain's tenosynovitis 04/15/2015  . Right knee pain 04/15/2015  . History of perforated ear drum 04/15/2015  . Asthma with acute exacerbation 09/14/2014  . Skin lesion of breast 09/14/2014  . Thyroid nodule 09/14/2014  . Rectal bleeding 09/14/2014  . Bipolar disorder, curr episode mixed, severe, w/o psychotic features (HCC) 07/11/2014  . Cannabis use disorder, moderate, dependence (HCC) 07/11/2014  . Overweight (BMI 25.0-29.9) 10/12/2013  . Menstrual cramp 03/24/2012  . Asthma 06/28/2010  . ADHD 06/13/2007    Medications- reviewed and updated Current Outpatient Prescriptions  Medication Sig Dispense Refill  . albuterol (PROAIR HFA) 108 (90 Base) MCG/ACT inhaler Inhale 1 puff into the lungs every 4 (four) hours as needed. Dispense with spacer. For wheezing 2 Inhaler 1  . cetirizine (ZYRTEC) 10 MG tablet Take 10 mg by mouth daily as needed for allergies. Reported on 07/18/2015    . clonazePAM (KLONOPIN) 1 MG tablet Take 1 mg by mouth 2 (two) times daily as needed for anxiety.    . lamoTRIgine (LAMICTAL) 200 MG tablet Take 200 mg by mouth daily. Reported on 05/23/2015    . levonorgestrel-ethinyl estradiol (LEVORA 0.15/30, 28,) 0.15-30 MG-MCG tablet Take 1 tablet by mouth daily. 1  Package 11  . metroNIDAZOLE (FLAGYL) 500 MG tablet Take 1 tablet (500 mg total) by mouth 2 (two) times daily. 14 tablet 0  . neomycin-colistin-hydrocortisone-thonzonium (CORTISPORIN-TC) 3.03-31-08-0.5 MG/ML otic suspension Place 5 drops into the left ear 4 (four) times daily. 10 mL 0  . traZODone (DESYREL) 100 MG tablet Take 1 tablet (100 mg total) by mouth at bedtime. For insomnia 30 tablet 0  . VYVANSE 40 MG capsule   0   No current facility-administered medications for this visit.     Objective: Office vital signs reviewed. BP 128/88   Pulse 83   Temp 98.4 F (36.9 C) (Oral)   Wt 156 lb (70.8 kg)   BMI 25.96 kg/m    Physical Examination:  General: Awake, alert, well- nourished, NAD GYN:  External genitalia within normal limits.  Vaginal mucosa pink, moist, normal rugae.  Nonfriable cervix without lesions. Mild amount of foul smelling yellow discharge, no bleeding noted on speculum exam.  Bimanual exam revealed normal, nongravid uterus.  No cervical motion tenderness. No adnexal masses bilaterally.    Results for orders placed or performed in visit on 07/02/16  POCT Wet Prep [facility] [place])  Result Value Ref Range   Source Wet Prep POC VAG    WBC, Wet Prep HPF POC 1-5    Bacteria Wet Prep HPF POC Moderate (A) Few   Clue Cells Wet Prep HPF POC Moderate (A) None   Clue Cells Wet Prep Whiff POC Positive Whiff    Yeast Wet Prep HPF POC None    Trichomonas Wet Prep HPF POC Absent  Absent     Assessment/Plan: Bacterial vaginosis History, exam, and wet prep consistent with bacterial vaginosis. - Flagyl 500mg  BID x 7 days. Discussed disulfiram reaction.     Health care maintenance Noted the patient was due for a Pap smear, last was in 2013. -Pap smear performed today with co-testing   Orders Placed This Encounter  Procedures  . POCT Wet Prep [facility])    Meds ordered this encounter  Medications  . metroNIDAZOLE (FLAGYL) 500 MG tablet    Sig: Take 1 tablet (500 mg total)  by mouth 2 (two) times daily.    Dispense:  14 tablet    Refill:  0  . albuterol (PROAIR HFA) 108 (90 Base) MCG/ACT inhaler    Sig: Inhale 1 puff into the lungs every 4 (four) hours as needed. Dispense with spacer. For wheezing    Dispense:  2 Inhaler    Refill:  1    Joanna Puff PGY-3, [facility]

## 2016-07-02 NOTE — Assessment & Plan Note (Signed)
Noted the patient was due for a Pap smear, last was in 2013. -Pap smear performed today with co-testing

## 2016-07-03 ENCOUNTER — Encounter: Payer: Self-pay | Admitting: Family Medicine

## 2016-07-03 LAB — CYTOLOGY - PAP
Adequacy: ABSENT
DIAGNOSIS: NEGATIVE
HPV (WINDOPATH): NOT DETECTED

## 2016-07-06 ENCOUNTER — Telehealth: Payer: Self-pay | Admitting: *Deleted

## 2016-07-06 NOTE — Telephone Encounter (Signed)
Received message on nurse line from Zella BallRobin at [facility] [phone] stating that there had been a misfill of patient's OBC. Patient was given 91 day supply of Seasonique instead of what is listed on med record (28 day supply). They have not been able to reach patient to discuss. They will refill with correct med unless PCP feels they should continue with seasonique. Please advise. Kinnie FeilL. Lonzy Mato, RN, BSN

## 2016-07-10 NOTE — Telephone Encounter (Signed)
Called to discuss with patient. She is now on the 28 day supply, states she's gotten it straightened out with the pharmacy.  Likes this. Will continue with this.  Attempted to call pharmacy, no answer.   Additionally pt concerned about cancer. She has been giving herself enemas. Doing Senokot currently twice per day. Never tried MiraLax.   Will try Miralax BID or TID. Will make an appt if there is no improvement.  Joanna Puffrystal S. Dorsey, MD [facility] Resident  07/10/2016, 8:33 AM

## 2016-08-02 ENCOUNTER — Other Ambulatory Visit: Payer: Self-pay | Admitting: Family Medicine

## 2016-10-08 ENCOUNTER — Encounter (HOSPITAL_COMMUNITY): Payer: Self-pay | Admitting: Emergency Medicine

## 2016-10-08 ENCOUNTER — Ambulatory Visit (HOSPITAL_COMMUNITY)
Admission: EM | Admit: 2016-10-08 | Discharge: 2016-10-08 | Disposition: A | Discharge status: Home / Self Care | Payer: Medicaid Other | Attending: Family Medicine | Admitting: Family Medicine

## 2016-10-08 DIAGNOSIS — Z87891 Personal history of nicotine dependence: Secondary | ICD-10-CM | POA: Diagnosis not present

## 2016-10-08 DIAGNOSIS — Z880 Allergy status to penicillin: Secondary | ICD-10-CM | POA: Diagnosis not present

## 2016-10-08 DIAGNOSIS — R5383 Other fatigue: Secondary | ICD-10-CM | POA: Diagnosis present

## 2016-10-08 DIAGNOSIS — R0789 Other chest pain: Secondary | ICD-10-CM | POA: Diagnosis not present

## 2016-10-08 DIAGNOSIS — R109 Unspecified abdominal pain: Secondary | ICD-10-CM | POA: Diagnosis present

## 2016-10-08 DIAGNOSIS — Z79899 Other long term (current) drug therapy: Secondary | ICD-10-CM | POA: Diagnosis not present

## 2016-10-08 DIAGNOSIS — R5382 Chronic fatigue, unspecified: Secondary | ICD-10-CM | POA: Diagnosis not present

## 2016-10-08 LAB — CBC
HCT: 46.2 % — ABNORMAL HIGH (ref 36.0–46.0)
Hemoglobin: 15.5 g/dL — ABNORMAL HIGH (ref 12.0–15.0)
MCH: 31.3 pg (ref 26.0–34.0)
MCHC: 33.5 g/dL (ref 30.0–36.0)
MCV: 93.3 fL (ref 78.0–100.0)
Platelets: 226 10*3/uL (ref 150–400)
RBC: 4.95 MIL/uL (ref 3.87–5.11)
RDW: 12.1 % (ref 11.5–15.5)
WBC: 8.8 10*3/uL (ref 4.0–10.5)

## 2016-10-08 LAB — TSH: TSH: 1.179 u[IU]/mL (ref 0.350–4.500)

## 2016-10-08 NOTE — Discharge Instructions (Signed)
It was nice meeting you today Ms. Quizhpi!  Today we have measured your thyroid hormone level and your blood count. We will let you know within 3-5 business days if there are any abnormalities with your labwork. I would encourage you to schedule an appointment with your primary doctor at [facility] to discuss what the next steps are in trying to find out what is causing your symptoms. They will have access to the results of the labwork that we have collected today.   Be well,  Dr. Natale MilchLancaster

## 2016-10-08 NOTE — ED Provider Notes (Signed)
[facility]    CSN: [id number] Arrival date & time: 10/08/16  1305  History   Chief Complaint Chief Complaint  Patient presents with  . Abdominal Pain  . Fatigue    HPI [name] is a 36 y.o. female.   HPI   Patient presents with complaints of a constellation of symptoms for the past one month, primarily fatigue and chest wall pain.   Symptoms began about a month ago. Patient initially noticed fatigue. She is a runner and typically runs 3-4 miles per day, but has been so fatigued over the past month that she has not been able to do so. Says that even walking up the stairs at her house makes her very fatigued now. She also becomes out of breath easily and feels dizzy at times. She has been sleeping more than usual; she typically goes to bed around 8PM after putting her children to bed and wakes up around 7:30 AM. Has started taking naps during the day because she is so tired which is unusual for her. Appetite has been normal, but says she does occasionally feel nauseated when eating. Denies any skin changes, but says that her mother told her her hair seems thinner. Generally feels not like herself, and says she just wants to feel "normal" again.  Also reports a feeling of chest wall pain. Points to her midsternal region when describing pain. Says she is able to reproduce the pain by pressing down on that area. Says it feels as if someone has set down a heavy weight on her chest. Denies associated breathing difficulties. Denies sharp pain or radiating pain. Denies palpitations. Says she has a history of indigestion but this does not feel like that. Endorses a sense of fullness in her throat and difficulty swallowing at times, occasionally to the point that she gags.  Endorses HA occurring all day every day. HA is located across forehead. Endorses associated nausea and vomiting. Emesis is green. Has taken ibuprofen  which helps some.  Denies recent stressors or major life  changes. Says that her mood is generally good. Has a history of anxiety for which she takes Klonopin. Says that her anxiety symptoms have been well-controlled recently. Denies depressive symptoms.  Of note, patient is prescribed OCPs, but stopped taking then a while ago out of concern that they may be causing her symptoms. LMP was over a month ago. She says she has taken a couple of pregnancy tests at home that have been negative.   Past Medical History:  Diagnosis Date  . Abnormal Pap smear   . ADHD (attention deficit hyperactivity disorder)   . Anemia    generalized anxiety disorder  . Anxiety   . Asthma   . Blood transfusion    Pt delivery in 2009  . Complication of anesthesia   . Headache(784.0)   . PPH (postpartum hemorrhage)   . Spinal headache     Patient Active Problem List   Diagnosis Date Noted  . Bacterial vaginosis 07/02/2016  . Health care maintenance 07/02/2016  . Contraception management 04/05/2016  . Frequent No-show for appointment 01/16/2016  . Bruising 05/06/2015  . De Quervain's tenosynovitis 04/15/2015  . Right knee pain 04/15/2015  . History of perforated ear drum 04/15/2015  . Asthma with acute exacerbation 09/14/2014  . Skin lesion of breast 09/14/2014  . Thyroid nodule 09/14/2014  . Rectal bleeding 09/14/2014  . Bipolar disorder, curr episode mixed, severe, w/o psychotic features (HCC) 07/11/2014  . Cannabis use disorder,  moderate, dependence (HCC) 07/11/2014  . Overweight (BMI 25.0-29.9) 10/12/2013  . Menstrual cramp 03/24/2012  . Asthma 06/28/2010  . ADHD 06/13/2007    Past Surgical History:  Procedure Laterality Date  . TONSILLECTOMY  2011    OB History    Gravida Para Term Preterm AB Living   SAB TAB Ectopic Multiple Live Births       1   2       Home Medications    Prior to Admission medications   Medication Sig Start Date End Date Taking? Authorizing Provider  albuterol (PROAIR HFA) 108 (90 Base) MCG/ACT inhaler  Inhale 1 puff into the lungs every 4 (four) hours as needed. Dispense with spacer. For wheezing 07/02/16   Joanna Puff, MD  cetirizine (ZYRTEC) 10 MG tablet Take 10 mg by mouth daily as needed for allergies. Reported on 07/18/2015    [provider]  clonazePAM (KLONOPIN) 1 MG tablet Take 1 mg by mouth 2 (two) times daily as needed for anxiety.    [provider]  lamoTRIgine (LAMICTAL) 200 MG tablet Take 200 mg by mouth daily. Reported on 05/23/2015    [provider]  Levonorgestrel-Ethinyl Estradiol (AMETHIA,CAMRESE) 0.15-0.03 &0.01 MG tablet TAKE 1 TABLET BY MOUTH DAILY. 08/02/16   Wendee Beavers, DO  metroNIDAZOLE (FLAGYL) 500 MG tablet Take 1 tablet (500 mg total) by mouth 2 (two) times daily. 07/02/16   Joanna Puff, MD  neomycin-colistin-hydrocortisone-thonzonium (CORTISPORIN-TC) 3.03-31-08-0.5 MG/ML otic suspension Place 5 drops into the left ear 4 (four) times daily. 01/19/16   Ardith Dark, MD  traZODone (DESYREL) 100 MG tablet Take 1 tablet (100 mg total) by mouth at bedtime. For insomnia 07/16/14   Sanjuana Kava, NP  VYVANSE 40 MG capsule  03/31/15   [provider]    Family History Family History  Problem Relation Age of Onset  . Thrombophlebitis Mother   . Hypertension Father   . Heart disease Maternal Grandmother   . Heart disease Paternal Grandfather   . Hypertension Paternal Grandfather     Social History Social History  Substance Use Topics  . Smoking status: Former Smoker    Packs/day: 0.50  . Smokeless tobacco: Never Used  . Alcohol use No     Allergies   Codeine; Gadolinium derivatives; Haldol [haloperidol]; Lamictal [lamotrigine]; Amoxicillin; Keflex [cephalexin]; and Penicillins   Review of Systems Review of Systems  Constitutional: Positive for activity change and fatigue. Negative for appetite change.  HENT: Positive for trouble swallowing. Negative for sore throat.   Eyes: Negative for visual disturbance.    Respiratory: Positive for shortness of breath (With exertion).   Cardiovascular: Negative for chest pain and palpitations.  Gastrointestinal: Positive for nausea and vomiting. Negative for diarrhea.  Musculoskeletal:       Chest wall pain   Neurological: Positive for dizziness and headaches.  Psychiatric/Behavioral: Negative for sleep disturbance. The patient is not nervous/anxious.     Physical Exam Triage Vital Signs ED Triage Vitals [10/08/16 1338]  Enc Vitals Group     BP 122/78     Pulse Rate 75     Resp 18     Temp 97.9 F (36.6 C)     Temp Source Oral     SpO2 100 %     Weight      Height      Head Circumference      Peak Flow  Pain Score      Pain Loc      Pain Edu?      Excl. in GC?    No data found.   Updated Vital Signs BP 122/78 (BP Location: Left Arm)   Pulse 75   Temp 97.9 F (36.6 C) (Oral)   Resp 18   SpO2 100%   Physical Exam  Constitutional: She is oriented to person, place, and time. She appears well-developed and well-nourished. No distress.  HENT:  Head: Normocephalic and atraumatic.  Nose: Nose normal.  Mouth/Throat: Oropharynx is clear and moist. No oropharyngeal exudate.  Eyes: Pupils are equal, round, and reactive to light. Conjunctivae and EOM are normal. Right eye exhibits no discharge. Left eye exhibits no discharge.  Neck: Normal range of motion. Neck supple. No thyromegaly present.  Cardiovascular: Normal rate, regular rhythm and normal heart sounds.   No murmur heard. Pulmonary/Chest: Effort normal and breath sounds normal. No respiratory distress. She has no wheezes.  Abdominal: Soft. Bowel sounds are normal. She exhibits no distension. There is no tenderness.  Musculoskeletal:  TTP of midsternal region  Lymphadenopathy:    She has no cervical adenopathy.  Neurological: She is alert and oriented to person, place, and time.  Skin: Skin is warm and dry. No erythema.  Psychiatric: She has a normal mood and affect. Her  behavior is normal.    UC Treatments / Results  Labs (all labs ordered are listed, but only abnormal results are displayed) Labs Reviewed  CBC  TSH    EKG  EKG Interpretation None       Radiology No results found.  Procedures Procedures (including critical care time)  Medications Ordered in UC Medications - No data to display   Initial Impression / Assessment and Plan / UC Course  I have reviewed the triage vital signs and the nursing notes.  Pertinent labs & imaging results that were available during my care of the patient were reviewed by me and considered in my medical decision making (see chart for details).    Patient presenting with multiple complaints, primarily fatigue and chest wall tenderness. Constellation of symptoms could be consistent with hypothyroidism, given fatigue, increased sleep, and thinning hair. Less likely depression, as patient denying recent stressors and endorsing generally good mood. Would recommend following up on this however. Could also be pregnancy, as patient recently stopped taking OCPs. Will measure CBC, TSH, and urine preg today, and recommend f[facility] with [facility]CP at [facility].   Upreg negative. Patient has already scheduled appt with PCP tomorrow. Discussed with her that we likely will not be able to determine the exact cause of her symptoms right away, and that it may take some time for her symptoms to improve.   Final Clinical Impressions(s) / UC Diagnoses   Final diagnoses:  Chronic fatigue  Chest wall pain    New Prescriptions New Prescriptions   No medications on file   Tarri AbernethyAbigail J Kynzli Rease, MD, MPH PGY-3     Marquette SaaLancaster, Ilissa Rosner Joseph, MD 10/08/16 1450

## 2016-10-08 NOTE — ED Triage Notes (Signed)
Complains of chest pain for the past few weeks.  Pain to center chest, painful to the touch.  Patient reports feeling tired recently, onset 2 weeks ago.  Patient complains of indigestion.  Feels like something swollen in chest, chills.   Patient reports she is very active and feels she is not able to maintain usual active lifestyle in the last 2 weeks.

## 2016-11-20 ENCOUNTER — Ambulatory Visit: Payer: Medicaid Other | Admitting: Family Medicine

## 2016-11-23 ENCOUNTER — Ambulatory Visit: Payer: Medicaid Other | Admitting: Family Medicine

## 2017-03-01 ENCOUNTER — Other Ambulatory Visit: Payer: Self-pay

## 2017-03-04 MED ORDER — LEVONORGEST-ETH ESTRAD 91-DAY 0.15-0.03 &0.01 MG PO TABS: 1.0000 | ORAL_TABLET | Freq: Every day | ORAL | 0 refills | Status: DC

## 2017-03-14 ENCOUNTER — Ambulatory Visit: Payer: Medicaid Other

## 2017-03-15 ENCOUNTER — Ambulatory Visit: Payer: Medicaid Other

## 2017-06-22 ENCOUNTER — Other Ambulatory Visit: Payer: Self-pay | Admitting: Family Medicine

## 2017-09-08 ENCOUNTER — Encounter (HOSPITAL_COMMUNITY): Payer: Self-pay

## 2017-09-08 ENCOUNTER — Ambulatory Visit (HOSPITAL_COMMUNITY)
Admission: EM | Admit: 2017-09-08 | Discharge: 2017-09-08 | Disposition: A | Discharge status: Home / Self Care | Payer: Medicaid Other | Attending: Family Medicine | Admitting: Family Medicine

## 2017-09-08 ENCOUNTER — Other Ambulatory Visit: Payer: Self-pay

## 2017-09-08 DIAGNOSIS — Z3202 Encounter for pregnancy test, result negative: Secondary | ICD-10-CM

## 2017-09-08 DIAGNOSIS — Z87891 Personal history of nicotine dependence: Secondary | ICD-10-CM | POA: Insufficient documentation

## 2017-09-08 DIAGNOSIS — F419 Anxiety disorder, unspecified: Secondary | ICD-10-CM | POA: Insufficient documentation

## 2017-09-08 DIAGNOSIS — Z79899 Other long term (current) drug therapy: Secondary | ICD-10-CM | POA: Diagnosis not present

## 2017-09-08 DIAGNOSIS — F909 Attention-deficit hyperactivity disorder, unspecified type: Secondary | ICD-10-CM | POA: Diagnosis not present

## 2017-09-08 DIAGNOSIS — R102 Pelvic and perineal pain: Secondary | ICD-10-CM | POA: Insufficient documentation

## 2017-09-08 DIAGNOSIS — R51 Headache: Secondary | ICD-10-CM | POA: Insufficient documentation

## 2017-09-08 DIAGNOSIS — R350 Frequency of micturition: Secondary | ICD-10-CM | POA: Diagnosis not present

## 2017-09-08 DIAGNOSIS — D649 Anemia, unspecified: Secondary | ICD-10-CM | POA: Insufficient documentation

## 2017-09-08 LAB — POCT URINALYSIS DIP (DEVICE)
BILIRUBIN URINE: NEGATIVE
GLUCOSE, UA: NEGATIVE mg/dL
Hgb urine dipstick: NEGATIVE
Ketones, ur: NEGATIVE mg/dL
LEUKOCYTES UA: NEGATIVE
NITRITE: NEGATIVE
Protein, ur: NEGATIVE mg/dL
SPECIFIC GRAVITY, URINE: 1.025 (ref 1.005–1.030)
UROBILINOGEN UA: 0.2 mg/dL (ref 0.0–1.0)
pH: 6 (ref 5.0–8.0)

## 2017-09-08 LAB — POCT PREGNANCY, URINE: Preg Test, Ur: NEGATIVE

## 2017-09-08 MED ORDER — NAPROXEN 375 MG PO TABS: 375.0000 mg | ORAL_TABLET | Freq: Two times a day (BID) | ORAL | 0 refills | Status: DC

## 2017-09-08 MED ORDER — ONDANSETRON HCL 4 MG PO TABS: 4.0000 mg | ORAL_TABLET | Freq: Three times a day (TID) | ORAL | 0 refills | Status: DC | PRN

## 2017-09-08 NOTE — ED Provider Notes (Signed)
[facility]    CSN: [id number] Arrival date & time: 09/08/17  1009     History   Chief Complaint Chief Complaint  Patient presents with  . Abdominal Pain  . Back Pain    HPI [name] is a 37 y.o. female.   [name] presents with complaints of pelvic pain which has been worsening over the past 2 weeks. Soreness to her low back. Pain was intermittent but now is constant. Cramping in nature. Denies vaginal discharge or itching. Has had pain with intercourse. No blood in urine, no vaginal bleeding. LMP was 2 weeks ago. Has had urinary frequency, she states that she originally felt increased symptoms with urination. Now with pain it causes some vomiting of bile, last episode last night. Normal bowel movements but has had increased gas. Has been eating and drinking. Denies any previous similar. Hx of adhd, anemia, anxiety, asthma, headache.    ROS per HPI.      Past Medical History:  Diagnosis Date  . Abnormal Pap smear   . ADHD (attention deficit hyperactivity disorder)   . Anemia    generalized anxiety disorder  . Anxiety   . Asthma   . Blood transfusion    Pt delivery in 2009  . Complication of anesthesia   . Headache(784.0)   . PPH (postpartum hemorrhage)   . Spinal headache     Patient Active Problem List   Diagnosis Date Noted  . Bacterial vaginosis 07/02/2016  . Health care maintenance 07/02/2016  . Contraception management 04/05/2016  . Frequent No-show for appointment 01/16/2016  . Bruising 05/06/2015  . De Quervain's tenosynovitis 04/15/2015  . Right knee pain 04/15/2015  . History of perforated ear drum 04/15/2015  . Asthma with acute exacerbation 09/14/2014  . Skin lesion of breast 09/14/2014  . Thyroid nodule 09/14/2014  . Rectal bleeding 09/14/2014  . Bipolar disorder, curr episode mixed, severe, w/o psychotic features (HCC) 07/11/2014  . Cannabis use disorder, moderate, dependence (HCC) 07/11/2014  . Overweight (BMI 25.0-29.9)  10/12/2013  . Menstrual cramp 03/24/2012  . Asthma 06/28/2010  . ADHD 06/13/2007    Past Surgical History:  Procedure Laterality Date  . TONSILLECTOMY  2011    OB History    Gravida  3   Para  2   Term  2   Preterm      AB  1   Living  2     SAB      TAB      Ectopic  1   Multiple      Live Births  2            Home Medications    Prior to Admission medications   Medication Sig Start Date End Date Taking? Authorizing Provider  clonazePAM (KLONOPIN) 0.5 MG tablet Take 0.5 mg by mouth 2 (two) times daily as needed for anxiety.   Yes [provider]  cetirizine (ZYRTEC) 10 MG tablet Take 10 mg by mouth daily as needed for allergies. Reported on 07/18/2015    [provider]  naproxen (NAPROSYN) 375 MG tablet Take 1 tablet (375 mg total) by mouth 2 (two) times daily. 09/08/17   Georgetta Haber, NP  ondansetron (ZOFRAN) 4 MG tablet Take 1 tablet (4 mg total) by mouth every 8 (eight) hours as needed for nausea or vomiting. 09/08/17   Georgetta Haber, NP  traZODone (DESYREL) 100 MG tablet Take 1 tablet (100 mg total) by mouth at bedtime. For insomnia 07/16/14  Sanjuana KavaNwoko, Agnes I, NP    Family History Family History  Problem Relation Age of Onset  . Thrombophlebitis Mother   . Hypertension Father   . Heart disease Maternal Grandmother   . Heart disease Paternal Grandfather   . Hypertension Paternal Grandfather     Social History Social History   Tobacco Use  . Smoking status: Former Smoker    Packs/day: 0.50  . Smokeless tobacco: Never Used  Substance Use Topics  . Alcohol use: No  . Drug use: Yes    Types: Marijuana, Amphetamines    Comment: 07/18/15  "smoked off and on since age 37"     Allergies   Codeine; Gadolinium derivatives; Haldol [haloperidol]; Lamictal [lamotrigine]; Amoxicillin; Keflex [cephalexin]; and Penicillins   Review of Systems Review of Systems   Physical Exam Triage Vital Signs ED Triage Vitals  Enc  Vitals Group     BP 09/08/17 1029 107/70     Pulse Rate 09/08/17 1029 62     Resp 09/08/17 1029 16     Temp 09/08/17 1029 98 F (36.7 C)     Temp Source 09/08/17 1029 Oral     SpO2 09/08/17 1029 99 %     Weight 09/08/17 1031 145 lb (65.8 kg)     Height --      Head Circumference --      Peak Flow --      Pain Score 09/08/17 1031 8     Pain Loc --      Pain Edu? --      Excl. in GC? --    No data found.  Updated Vital Signs BP 107/70   Pulse 62   Temp 98 F (36.7 C) (Oral)   Resp 16   Wt 145 lb (65.8 kg)   LMP 08/25/2017   SpO2 99%   BMI 24.13 kg/m    Physical Exam  Constitutional: She is oriented to person, place, and time. She appears well-developed and well-nourished. No distress.  Cardiovascular: Normal rate, regular rhythm and normal heart sounds.  Pulmonary/Chest: Effort normal and breath sounds normal.  Abdominal: There is no hepatosplenomegaly. There is tenderness in the right lower quadrant, suprapubic area and left lower quadrant. There is no rigidity, no rebound, no guarding, no CVA tenderness, no tenderness at McBurney's point and negative Murphy's sign.  Genitourinary: There is no rash or tenderness on the right labia. There is no rash or tenderness on the left labia. Uterus is tender. Uterus is not enlarged. Cervix exhibits no motion tenderness. Right adnexum displays tenderness. Left adnexum displays tenderness. There is tenderness in the vagina. No erythema or bleeding in the vagina. No foreign body in the vagina. No vaginal discharge found.  Genitourinary Comments: Generalized tenderness with pelvic exam and bimanual exam; no discharge; no bleeding or cmt  Neurological: She is alert and oriented to person, place, and time.  Skin: Skin is warm and dry.     UC Treatments / Results  Labs (all labs ordered are listed, but only abnormal results are displayed) Labs Reviewed  POCT PREGNANCY, URINE  POCT URINALYSIS DIP (DEVICE)  CERVICOVAGINAL ANCILLARY ONLY      EKG None  Radiology No results found.  Procedures Procedures (including critical care time)  Medications Ordered in UC Medications - No data to display  Initial Impression / Assessment and Plan / UC Course  I have reviewed the triage vital signs and the nursing notes.  Pertinent labs & imaging results that were available during my care  of the patient were reviewed by me and considered in my medical decision making (see chart for details).     Symptoms for 2 weeks. Non specific on exam and with history. Normal ua. Vaginal cytology pending. Will start with naproxen and zofran as needed. Follow up with gyn for recheck and establish care. Return precautions provided. Patient verbalized understanding and agreeable to plan.    Final Clinical Impressions(s) / UC Diagnoses   Final diagnoses:  Pelvic pain     Discharge Instructions     Your urine is completely normal today.  You obviously have some tenderness to the pelvic which is still non specific on exam.  I have sent naproxen twice a day to see if this is helpful with pain.  Zofran as needed for nausea.  I question if further workup such as an ultrasound is indicated, I do not see an emergent need today but would certainly recommend close follow up with a primary care provider or gynecology.  If worsening of symptoms- fevers, chills, increased pain, vaginal symptoms, vomiting, diarrhea please go to the Er.     ED Prescriptions    Medication Sig Dispense Auth. Provider   ondansetron (ZOFRAN) 4 MG tablet Take 1 tablet (4 mg total) by mouth every 8 (eight) hours as needed for nausea or vomiting. 10 tablet Linus Mako B, NP   naproxen (NAPROSYN) 375 MG tablet Take 1 tablet (375 mg total) by mouth 2 (two) times daily. 20 tablet Georgetta Haber, NP     Controlled Substance Prescriptions [place] Controlled Substance Registry consulted? Not Applicable   Georgetta Haber, NP 09/08/17 1124

## 2017-09-08 NOTE — Discharge Instructions (Addendum)
Your urine is completely normal today.  You obviously have some tenderness to the pelvic which is still non specific on exam.  I have sent naproxen twice a day to see if this is helpful with pain.  Zofran as needed for nausea.  I question if further workup such as an ultrasound is indicated, I do not see an emergent need today but would certainly recommend close follow up with a primary care provider or gynecology.  If worsening of symptoms- fevers, chills, increased pain, vaginal symptoms, vomiting, diarrhea please go to the Er.

## 2017-09-08 NOTE — ED Triage Notes (Signed)
Lower back and abdominal pain 2 weeks..Marland Kitchen

## 2017-09-09 LAB — CERVICOVAGINAL ANCILLARY ONLY
Bacterial vaginitis: NEGATIVE
CANDIDA VAGINITIS: POSITIVE — AB
Chlamydia: NEGATIVE
Neisseria Gonorrhea: NEGATIVE
Trichomonas: NEGATIVE

## 2017-09-10 ENCOUNTER — Ambulatory Visit: Payer: Medicaid Other | Admitting: Family Medicine

## 2017-09-10 VITALS — BP 100/60 | HR 65 | Temp 98.7°F | Wt 142.8 lb

## 2017-09-10 DIAGNOSIS — B373 Candidiasis of vulva and vagina: Secondary | ICD-10-CM

## 2017-09-10 DIAGNOSIS — R1030 Lower abdominal pain, unspecified: Secondary | ICD-10-CM

## 2017-09-10 DIAGNOSIS — B3731 Acute candidiasis of vulva and vagina: Secondary | ICD-10-CM

## 2017-09-10 MED ORDER — FLUCONAZOLE 150 MG PO TABS: 150.0000 mg | ORAL_TABLET | Freq: Once | ORAL | 0 refills | Status: AC

## 2017-09-10 NOTE — Progress Notes (Signed)
   Subjective   Patient ID: [name]    DOB: [birth year], 37 y.o. female   MRN: [id number]  CC: "I have abdominal pain"  HPI: Brittney SlocumbMorgan L Mindel is a 37 y.o. female who presents to clinic today for the following:  ABDOMINAL PAIN  Onset: 2 weeks ago after menstrual cycle, "my periods are usually pretty messed up" Medications tried: naproxen without improvement Similar pain before: "yes but not this severe" Prior abdominal surgeries: none, x2 vaginal births  Symptoms Nausea/vomiting: yes, taking Zofran with relief, last episode yesterday x1 "small with bilious color" Diarrhea: yes Constipation: no Blood in stool: no Blood in vomit: no Fever: no Dysuria: no Loss of appetite: no Weight loss: no Vaginal Bleeding: none since period which was shorter Missed menstrual period: no  ROS: see HPI for pertinent.  PMFSH: ADHD, anxiety. Surgical history tonsillectomy. Family history thrombophlebitis, HTN. Smoking status reviewed. Medications reviewed.  Objective   BP 100/60 (BP Location: Left Arm, Patient Position: Sitting, Cuff Size: Normal)   Pulse 65   Temp 98.7 F (37.1 C) (Oral)   Wt 142 lb 12.8 oz (64.8 kg)   LMP 08/25/2017   SpO2 98%   BMI 23.76 kg/m  Vitals and nursing note reviewed.  General: well nourished, well developed, NAD with non-toxic appearance HEENT: normocephalic, atraumatic, moist mucous membranes Neck: supple, non-tender without lymphadenopathy Cardiovascular: regular rate and rhythm without murmurs, rubs, or gallops Lungs: clear to auscultation bilaterally with normal work of breathing Abdomen: soft, non-tender, non-distended, normoactive bowel sounds Skin: warm, dry, no rashes or lesions, cap refill < 2 seconds Extremities: warm and well perfused, normal tone, no edema  Assessment & Plan   Lower abdominal pain Subacute.  Uncertain etiology though candidiasis evident on recent pelvic exam.  No signs of acute abdomen.  Does have long history of AUB.   Recent pregnancy test negative.  Does have associated nausea and vomiting with diarrhea making viral gastroenteritis likely. - Given single tablet of ketoconazole 150 mg - Ambulatory referral to OB/GYN for further investigation of AUB - Discussed conservative management of likely viral gastroenteritis and typical progression - Reviewed return precautions  Orders Placed This Encounter  Procedures  . Ambulatory referral to Obstetrics / Gynecology    Referral Priority:   Routine    Referral Type:   Consultation    Referral Reason:   Specialty Services Required    Requested Specialty:   Obstetrics and Gynecology    Number of Visits Requested:   1   Meds ordered this encounter  Medications  . fluconazole (DIFLUCAN) 150 MG tablet    Sig: Take 1 tablet (150 mg total) by mouth once for 1 dose.    Dispense:  1 tablet    Refill:  0    Durward Parcelavid Vanette Noguchi, DO [facility], PGY-3 09/10/2017, 1:27 PM

## 2017-09-10 NOTE — Assessment & Plan Note (Signed)
Subacute.  Uncertain etiology though candidiasis evident on recent pelvic exam.  No signs of acute abdomen.  Does have long history of AUB.  Recent pregnancy test negative.  Does have associated nausea and vomiting with diarrhea making viral gastroenteritis likely. - Given single tablet of ketoconazole 150 mg - Ambulatory referral to OB/GYN for further investigation of AUB - Discussed conservative management of likely viral gastroenteritis and typical progression - Reviewed return precautions

## 2017-09-10 NOTE — Patient Instructions (Signed)
Thank you for coming in to see us today. Please see below to review our plan for today's visit.  Take the single fluconazole pill for the fungal infection.  If this is related to your abdominal pain, this should improve over the next few days.  I am suspicious that this is more likely related to a viral gastroenteritis.  That being said, you should improve over the next week.  Take the Zofran as needed.  I have placed a referral for you to see an OB/GYN doctor as requested.  They will contact you in the next week or 2 to schedule the appointment.  Please call the clinic at [phone] if your symptoms worsen or you have any concerns. It was our pleasure to serve you.  Durward Parcelavid McMullen, DO [facility], PGY-3

## 2017-09-20 ENCOUNTER — Encounter: Payer: Self-pay | Admitting: Obstetrics & Gynecology

## 2017-09-24 ENCOUNTER — Ambulatory Visit: Payer: Medicaid Other | Admitting: Family Medicine

## 2017-11-04 ENCOUNTER — Encounter: Payer: Medicaid Other | Admitting: Obstetrics & Gynecology

## 2017-11-04 ENCOUNTER — Encounter: Payer: Self-pay | Admitting: Obstetrics & Gynecology

## 2018-02-17 ENCOUNTER — Ambulatory Visit: Payer: Medicaid Other | Admitting: Family Medicine

## 2018-04-12 ENCOUNTER — Other Ambulatory Visit: Payer: Self-pay | Admitting: Family Medicine

## 2018-07-13 ENCOUNTER — Other Ambulatory Visit: Payer: Self-pay | Admitting: Family Medicine

## 2018-07-21 DIAGNOSIS — K029 Dental caries, unspecified: Secondary | ICD-10-CM | POA: Insufficient documentation

## 2018-10-16 ENCOUNTER — Other Ambulatory Visit: Payer: Self-pay | Admitting: *Deleted

## 2018-10-17 MED ORDER — LEVONORGEST-ETH ESTRAD 91-DAY 0.15-0.03 &0.01 MG PO TABS: 1.0000 | ORAL_TABLET | Freq: Every day | ORAL | 2 refills | Status: DC

## 2019-07-27 ENCOUNTER — Other Ambulatory Visit: Payer: Self-pay | Admitting: Student in an Organized Health Care Education/Training Program

## 2020-11-20 ENCOUNTER — Emergency Department (HOSPITAL_COMMUNITY): Payer: Medicaid Other

## 2020-11-20 ENCOUNTER — Other Ambulatory Visit: Payer: Self-pay

## 2020-11-20 ENCOUNTER — Emergency Department (HOSPITAL_COMMUNITY)
Admission: EM | Admit: 2020-11-20 | Discharge: 2020-11-20 | Disposition: A | Discharge status: Home / Self Care | Payer: Medicaid Other | Attending: Emergency Medicine | Admitting: Emergency Medicine

## 2020-11-20 ENCOUNTER — Encounter (HOSPITAL_COMMUNITY): Payer: Self-pay | Admitting: Emergency Medicine

## 2020-11-20 DIAGNOSIS — Z87891 Personal history of nicotine dependence: Secondary | ICD-10-CM | POA: Insufficient documentation

## 2020-11-20 DIAGNOSIS — N939 Abnormal uterine and vaginal bleeding, unspecified: Secondary | ICD-10-CM

## 2020-11-20 DIAGNOSIS — Z87440 Personal history of urinary (tract) infections: Secondary | ICD-10-CM | POA: Insufficient documentation

## 2020-11-20 DIAGNOSIS — Z79899 Other long term (current) drug therapy: Secondary | ICD-10-CM | POA: Insufficient documentation

## 2020-11-20 DIAGNOSIS — A749 Chlamydial infection, unspecified: Secondary | ICD-10-CM | POA: Insufficient documentation

## 2020-11-20 DIAGNOSIS — R109 Unspecified abdominal pain: Secondary | ICD-10-CM | POA: Diagnosis not present

## 2020-11-20 LAB — URINALYSIS, ROUTINE W REFLEX MICROSCOPIC
Bacteria, UA: NONE SEEN
Bilirubin Urine: NEGATIVE
Glucose, UA: NEGATIVE mg/dL
Ketones, ur: NEGATIVE mg/dL
Leukocytes,Ua: NEGATIVE
Nitrite: NEGATIVE
Protein, ur: NEGATIVE mg/dL
RBC / HPF: >50 RBC/hpf — ABNORMAL HIGH (ref 0–5)
Specific Gravity, Urine: 1.02 (ref 1.005–1.030)
pH: 7 (ref 5.0–8.0)

## 2020-11-20 LAB — WET PREP, GENITAL
Clue Cells Wet Prep HPF POC: NONE SEEN
Sperm: NONE SEEN
Trich, Wet Prep: NONE SEEN
Yeast Wet Prep HPF POC: NONE SEEN

## 2020-11-20 LAB — TYPE AND SCREEN
ABO/RH(D): AB POS
Antibody Screen: NEGATIVE

## 2020-11-20 LAB — CBC
HCT: 45.5 % (ref 36.0–46.0)
Hemoglobin: 14.8 g/dL (ref 12.0–15.0)
MCH: 31.4 pg (ref 26.0–34.0)
MCHC: 32.5 g/dL (ref 30.0–36.0)
MCV: 96.4 fL (ref 80.0–100.0)
Platelets: 227 10*3/uL (ref 150–400)
RBC: 4.72 MIL/uL (ref 3.87–5.11)
RDW: 12.5 % (ref 11.5–15.5)
WBC: 10.3 10*3/uL (ref 4.0–10.5)
nRBC: 0 % (ref 0.0–0.2)

## 2020-11-20 LAB — I-STAT BETA HCG BLOOD, ED (MC, WL, AP ONLY): I-stat hCG, quantitative: <5 m[IU]/mL (ref <5)

## 2020-11-20 LAB — COMPREHENSIVE METABOLIC PANEL
ALT: 12 U/L (ref 0–44)
AST: 14 U/L — ABNORMAL LOW (ref 15–41)
Albumin: 3.5 g/dL (ref 3.5–5.0)
Alkaline Phosphatase: 64 U/L (ref 38–126)
Anion gap: 7 (ref 5–15)
BUN: 10 mg/dL (ref 6–20)
CO2: 27 mmol/L (ref 22–32)
Calcium: 9.2 mg/dL (ref 8.9–10.3)
Chloride: 104 mmol/L (ref 98–111)
Creatinine, Ser: 0.71 mg/dL (ref 0.44–1.00)
GFR, Estimated: >60 mL/min (ref >60)
Glucose, Bld: 76 mg/dL (ref 70–99)
Potassium: 3.9 mmol/L (ref 3.5–5.1)
Sodium: 138 mmol/L (ref 135–145)
Total Bilirubin: 0.4 mg/dL (ref 0.3–1.2)
Total Protein: 6.8 g/dL (ref 6.5–8.1)

## 2020-11-20 LAB — HIV ANTIBODY (ROUTINE TESTING W REFLEX): HIV Screen 4th Generation wRfx: NONREACTIVE

## 2020-11-20 MED ORDER — SODIUM CHLORIDE 0.9 % IV BOLUS: 1000.0000 mL | Freq: Once | INTRAVENOUS | Status: DC

## 2020-11-20 NOTE — ED Provider Notes (Signed)
Emergency Medicine Provider Triage Evaluation Note  [name] , a 40 y.o. female  was evaluated in triage.  Pt complains of vaginal bleeding for "multiple months".  She reports this last episode has been for the past month, and gradually worsening for the past 2 and half weeks.  She reports she is going through 10-12 soaked pads a day with clots.  Prior to his bleeding episodes, she reports her periods have been normal.  She denies any recent pregnancies or miscarriages.  She reports she has a few days in between bleeding episodes, but normally the bleeding episodes last for weeks at a time additionally with the bleeding, she is experiencing abdominal cramping and lightheadedness with change in position.  Denies any urinary symptoms, although patient was recently treated for a UTI 3 days ago and is on her fourth day of antibiotics..  Review of Systems  Positive: Vaginal bleeding, abdominal cramping, lightheadedness Negative: Dysuria, nausea, vomiting, fevers  Physical Exam  BP 96/75 (BP Location: Right Arm)   Pulse 87   Temp 98.4 F (36.9 C) (Oral)   Resp 16   SpO2 98%  Gen:   Awake, no distress , uncomfortable Resp:  Normal effort  MSK:   Moves extremities without difficulty  Other:  Abdomen soft, diffuse tenderness to lower abdomen  Medical Decision Making  Medically screening exam initiated at 9:52 AM.  Appropriate orders placed.  [name] was informed that the remainder of the evaluation will be completed by another provider, this initial triage assessment does not replace that evaluation, and the importance of remaining in the ED until their evaluation is complete.  Labs and imaging ordered.   Achille Rich, PA-C 11/20/20 [id number]    Brittney Cockayne, MD 11/20/20 [phone]

## 2020-11-20 NOTE — ED Notes (Signed)
Pt states she feels like she is well hydrated and does not want to stay for IV fluids  Pt states she knows where to pick up her antibiotics from last visit and states understanding of pick them up and completing prescription

## 2020-11-20 NOTE — ED Notes (Signed)
Pelvic cart at bedside. 

## 2020-11-20 NOTE — ED Notes (Signed)
Orthostatic vitals complete. Patient states that she was dizzy and felt like the room was spinning, when she stood up. Pt now back in bed and resting.

## 2020-11-20 NOTE — ED Provider Notes (Signed)
[facility] EMERGENCY DEPARTMENT Provider Note   CSN: [id number] Arrival date & time: 11/20/20  4801     History Chief Complaint  Patient presents with   Vaginal Bleeding    [name] is a 40 y.o. female who presents to the ED with lower abdominal pain, vaginal bleeding x 3-4 months. She was recently diagnosed with a UTI and chlamydia infection at urgent care on 11/16/20. She states her pain and bleeding have been significantly worsening over the past 2.5 weeks. She has been going through 10-12 pads daily with large clots noticed. She states her pain is crampy, constant and rates it 8/10. She has tried motrin w/ no relief. She was given macrobid (100mg  BIDx7 - currently on day 5 of treatment), flagyl 500mg  (500mg  BID x7 - currently on day 5), diflucan (completed), and doxycycline (100mg  Bid x7 - has not started). Pt was unaware of current chlamydia results and had not started the doxycycline. She admits to weakness, fatigue, dizziness, painful intercourse, nausea and vomited once. She denies fever, chills, weight loss, CP or SOB. She is currently sexually active with one long-term partner   Vaginal Bleeding     Past Medical History:  Diagnosis Date   Abnormal Pap smear    ADHD (attention deficit hyperactivity disorder)    Anemia    generalized anxiety disorder   Anxiety    Asthma    Blood transfusion    Pt delivery in 2009   Complication of anesthesia    Headache(784.0)    PPH (postpartum hemorrhage)    Spinal headache     Patient Active Problem List   Diagnosis Date Noted   Lower abdominal pain 09/10/2017   Bacterial vaginosis 07/02/2016   Health care maintenance 07/02/2016   Contraception management 04/05/2016   Frequent No-show for appointment 01/16/2016   Bruising 05/06/2015   De Quervain's tenosynovitis 04/15/2015   Right knee pain 04/15/2015   History of perforated ear drum 04/15/2015   Asthma with acute exacerbation 09/14/2014   Skin  lesion of breast 09/14/2014   Thyroid nodule 09/14/2014   Rectal bleeding 09/14/2014   Bipolar disorder, curr episode mixed, severe, w/o psychotic features (HCC) 07/11/2014   Cannabis use disorder, moderate, dependence (HCC) 07/11/2014   Overweight (BMI 25.0-29.9) 10/12/2013   Menstrual cramp 03/24/2012   Asthma 06/28/2010   ADHD 06/13/2007    Past Surgical History:  Procedure Laterality Date   TONSILLECTOMY  2011     OB History     Gravida  3   Para  2   Term  2   Preterm      AB  1   Living  2      SAB      IAB      Ectopic  1   Multiple      Live Births  2           Family History  Problem Relation Age of Onset   Thrombophlebitis Mother    Hypertension Father    Heart disease Maternal Grandmother    Heart disease Paternal Grandfather    Hypertension Paternal Grandfather     Social History   Tobacco Use   Smoking status: Former    Packs/day: 0.50    Types: Cigarettes   Smokeless tobacco: Never  Substance Use Topics   Alcohol use: No   Drug use: Yes    Types: Marijuana, Amphetamines    Comment: 07/18/15  "smoked off and on since age 65"  Home Medications Prior to Admission medications   Medication Sig Start Date End Date Taking? Authorizing Provider  cetirizine (ZYRTEC) 10 MG tablet Take 10 mg by mouth daily as needed for allergies. Reported on 07/18/2015    [provider]  clonazePAM (KLONOPIN) 0.5 MG tablet Take 0.5 mg by mouth 2 (two) times daily as needed for anxiety.    [provider]  Levonorgestrel-Ethinyl Estradiol (DAYSEE) 0.15-0.03 &0.01 MG tablet Take 1 tablet by mouth daily. 10/17/18   Leeroy Bock, MD  naproxen (NAPROSYN) 375 MG tablet Take 1 tablet (375 mg total) by mouth 2 (two) times daily. 09/08/17   Georgetta Haber, NP  ondansetron (ZOFRAN) 4 MG tablet Take 1 tablet (4 mg total) by mouth every 8 (eight) hours as needed for nausea or vomiting. 09/08/17   Georgetta Haber, NP  traZODone (DESYREL)  100 MG tablet Take 1 tablet (100 mg total) by mouth at bedtime. For insomnia 07/16/14   Armandina Stammer I, NP    Allergies    Codeine, Gadolinium derivatives, Haldol [haloperidol], Lamictal [lamotrigine], Amoxicillin, Keflex [cephalexin], and Penicillins  Review of Systems   Review of Systems  Genitourinary:  Positive for vaginal bleeding.  Ten systems reviewed and are negative for acute change, except as noted in the HPI.   Physical Exam Updated Vital Signs BP 104/69   Pulse 71   Temp 99.3 F (37.4 C) (Oral)   Resp 16   SpO2 100%   Physical Exam Vitals and nursing note reviewed.  Constitutional:      General: She is not in acute distress.    Appearance: She is well-developed. She is not diaphoretic.  HENT:     Head: Normocephalic and atraumatic.     Right Ear: External ear normal.     Left Ear: External ear normal.     Nose: Nose normal.     Mouth/Throat:     Mouth: Mucous membranes are moist.  Eyes:     General: No scleral icterus.    Conjunctiva/sclera: Conjunctivae normal.  Cardiovascular:     Rate and Rhythm: Normal rate and regular rhythm.     Heart sounds: Normal heart sounds. No murmur heard.   No friction rub. No gallop.  Pulmonary:     Effort: Pulmonary effort is normal. No respiratory distress.     Breath sounds: Normal breath sounds.  Abdominal:     General: Bowel sounds are normal. There is no distension.     Palpations: Abdomen is soft. There is no mass.     Tenderness: There is no abdominal tenderness. There is no guarding.  Genitourinary:    Comments: Normal external female genitalia Blood in the vaginal vault, no obvious discharge.  Cervical tenderness on exam with bleeding from the os without obvious discharge, no adnexal tenderness or fullness. Musculoskeletal:     Cervical back: Normal range of motion.  Skin:    General: Skin is warm and dry.  Neurological:     Mental Status: She is alert and oriented to person, place, and time.  Psychiatric:         Behavior: Behavior normal.    ED Results / Procedures / Treatments   Labs (all labs ordered are listed, but only abnormal results are displayed) Labs Reviewed  COMPREHENSIVE METABOLIC PANEL - Abnormal; Notable for the following components:      Result Value   AST 14 (*)    All other components within normal limits  URINALYSIS, ROUTINE W REFLEX MICROSCOPIC - Abnormal;  Notable for the following components:   Color, Urine AMBER (*)    Hgb urine dipstick LARGE (*)    RBC / HPF >50 (*)    All other components within normal limits  CBC  I-STAT BETA HCG BLOOD, ED ([facility], [facility], [facility] ONLY)  TYPE AND SCREEN    EKG None  Radiology US Transvaginal Non-OB  Result Date: 11/20/2020 CLINICAL DATA:  Vaginal bleeding for 1 month EXAM: TRANSABDOMINAL AND TRANSVAGINAL ULTRASOUND OF PELVIS TECHNIQUE: Both transabdominal and transvaginal ultrasound examinations of the pelvis were performed. Transabdominal technique was performed for global imaging of the pelvis including uterus, ovaries, adnexal regions, and pelvic cul-de-sac. It was necessary to proceed with endovaginal exam following the transabdominal exam to visualize the uterus, endometrium and ovaries. COMPARISON:  None FINDINGS: Uterus Measurements: 8.4 by 3.9 x 4.6 cm = volume: 80.6 mL. No fibroids or other mass visualized. Endometrium Thickness: 4 mm.  No focal abnormality visualized. Right ovary Measurements: 4.9 x 3.1 x 3.3 cm = volume: 25.4 mL. Anechoic cyst with increased through transmission is identified within the right ovary. This measures 2.9 x 2.2 x 3.1 cm. Left ovary Measurements: 2.5 x 1.4 x 2.3 cm = volume: 4.2 mL. Normal appearance/no adnexal mass. Other findings No abnormal free fluid. IMPRESSION: 1. No acute findings. No explanation for patient's vaginal bleeding. If bleeding remains unresponsive to hormonal or medical therapy, sonohysterogram should be considered for focal lesion work-up. (Ref: Radiological Reasoning: Algorithmic Workup  of Abnormal Vaginal Bleeding with Endovaginal Sonography and Sonohysterography. AJR 2008; 387:F64-33) the 2. Right ovary cyst. In a premenopausal female this is a normal finding and no imaging follow up is required. Electronically Signed   By: Signa Kell M.D.   On: 11/20/2020 11:50   US Pelvis Complete  Result Date: 11/20/2020 CLINICAL DATA:  Vaginal bleeding for 1 month EXAM: TRANSABDOMINAL AND TRANSVAGINAL ULTRASOUND OF PELVIS TECHNIQUE: Both transabdominal and transvaginal ultrasound examinations of the pelvis were performed. Transabdominal technique was performed for global imaging of the pelvis including uterus, ovaries, adnexal regions, and pelvic cul-de-sac. It was necessary to proceed with endovaginal exam following the transabdominal exam to visualize the uterus, endometrium and ovaries. COMPARISON:  None FINDINGS: Uterus Measurements: 8.4 by 3.9 x 4.6 cm = volume: 80.6 mL. No fibroids or other mass visualized. Endometrium Thickness: 4 mm.  No focal abnormality visualized. Right ovary Measurements: 4.9 x 3.1 x 3.3 cm = volume: 25.4 mL. Anechoic cyst with increased through transmission is identified within the right ovary. This measures 2.9 x 2.2 x 3.1 cm. Left ovary Measurements: 2.5 x 1.4 x 2.3 cm = volume: 4.2 mL. Normal appearance/no adnexal mass. Other findings No abnormal free fluid. IMPRESSION: 1. No acute findings. No explanation for patient's vaginal bleeding. If bleeding remains unresponsive to hormonal or medical therapy, sonohysterogram should be considered for focal lesion work-up. (Ref: Radiological Reasoning: Algorithmic Workup of Abnormal Vaginal Bleeding with Endovaginal Sonography and Sonohysterography. AJR 2008; 295:J88-41) the 2. Right ovary cyst. In a premenopausal female this is a normal finding and no imaging follow up is required. Electronically Signed   By: Signa Kell M.D.   On: 11/20/2020 11:50    Procedures Procedures   Medications Ordered in ED Medications - No  data to display  ED Course  I have reviewed the triage vital signs and the nursing notes.  Pertinent labs & imaging results that were available during my care of the patient were reviewed by me and considered in my medical decision making (see chart for  details).    MDM Rules/Calculators/A&P                           Patient here with abnormal uterine bleeding.  Recently diagnosed with chlamydia.  She has not started treatment because she was unaware of the diagnosis but is now informed and has a doxycycline prescription waiting for her at this time.  Patient ultrasound without evidence of ovarian torsion or significant inflammatory process.  Patient's labs show CBC with normal hemoglobin, no elevated white blood cell count, CMP without significant abnormality, negative beta hCG, wet prep shows moderate white blood cells, urine without evidence of infection. Patient has negative orthostatic vital signs.  She does not have any evidence of severe hemorrhage or PID on examination.  Patient will be discharged to follow closely with OB/GYN Final Clinical Impression(s) / ED Diagnoses Final diagnoses:  None    Rx / DC Orders ED Discharge Orders     None        Arthor Captain, PA-C 11/20/20 1558    Margarita Grizzle, MD 11/21/20 [phone]

## 2020-11-20 NOTE — ED Triage Notes (Signed)
C/o vaginal bleeding x 1 month that has been heavy with clots and pelvic pain x 1 week.

## 2020-11-20 NOTE — Discharge Instructions (Signed)
Get help right away if you: Pass out. Have bleeding that soaks through a pad every hour. Have pain in the abdomen. Have a fever or chills. Become sweaty or weak. Pass large blood clots from your vagina. 

## 2020-11-21 LAB — RPR: RPR Ser Ql: NONREACTIVE

## 2020-11-27 ENCOUNTER — Encounter (HOSPITAL_COMMUNITY): Payer: Self-pay

## 2020-11-27 ENCOUNTER — Other Ambulatory Visit: Payer: Self-pay

## 2020-11-27 ENCOUNTER — Ambulatory Visit (HOSPITAL_COMMUNITY)
Admission: RE | Admit: 2020-11-27 | Discharge: 2020-11-27 | Disposition: A | Discharge status: Home / Self Care | Payer: Medicaid Other | Attending: Psychiatry | Admitting: Psychiatry

## 2020-11-27 ENCOUNTER — Emergency Department (HOSPITAL_COMMUNITY)
Admission: EM | Admit: 2020-11-27 | Discharge: 2020-11-28 | Disposition: A | Discharge status: Home / Self Care | Payer: Medicaid Other | Source: Home / Self Care | Attending: Emergency Medicine | Admitting: Emergency Medicine

## 2020-11-27 ENCOUNTER — Other Ambulatory Visit: Payer: Self-pay | Admitting: Psychiatry

## 2020-11-27 DIAGNOSIS — D72829 Elevated white blood cell count, unspecified: Secondary | ICD-10-CM | POA: Insufficient documentation

## 2020-11-27 DIAGNOSIS — R45851 Suicidal ideations: Secondary | ICD-10-CM | POA: Insufficient documentation

## 2020-11-27 DIAGNOSIS — F419 Anxiety disorder, unspecified: Secondary | ICD-10-CM | POA: Insufficient documentation

## 2020-11-27 DIAGNOSIS — F329 Major depressive disorder, single episode, unspecified: Secondary | ICD-10-CM | POA: Insufficient documentation

## 2020-11-27 DIAGNOSIS — F17210 Nicotine dependence, cigarettes, uncomplicated: Secondary | ICD-10-CM | POA: Insufficient documentation

## 2020-11-27 DIAGNOSIS — J45901 Unspecified asthma with (acute) exacerbation: Secondary | ICD-10-CM | POA: Insufficient documentation

## 2020-11-27 DIAGNOSIS — Z20822 Contact with and (suspected) exposure to covid-19: Secondary | ICD-10-CM | POA: Insufficient documentation

## 2020-11-27 LAB — CBC WITH DIFFERENTIAL/PLATELET
Abs Immature Granulocytes: 0.04 10*3/uL (ref 0.00–0.07)
Basophils Absolute: 0.1 10*3/uL (ref 0.0–0.1)
Basophils Relative: 1 %
Eosinophils Absolute: 0.1 10*3/uL (ref 0.0–0.5)
Eosinophils Relative: 1 %
HCT: 43.1 % (ref 36.0–46.0)
Hemoglobin: 14.3 g/dL (ref 12.0–15.0)
Immature Granulocytes: 0 %
Lymphocytes Relative: 25 %
Lymphs Abs: 2.8 10*3/uL (ref 0.7–4.0)
MCH: 31.2 pg (ref 26.0–34.0)
MCHC: 33.2 g/dL (ref 30.0–36.0)
MCV: 94.1 fL (ref 80.0–100.0)
Monocytes Absolute: 0.5 10*3/uL (ref 0.1–1.0)
Monocytes Relative: 5 %
Neutro Abs: 7.5 10*3/uL (ref 1.7–7.7)
Neutrophils Relative %: 68 %
Platelets: 291 10*3/uL (ref 150–400)
RBC: 4.58 MIL/uL (ref 3.87–5.11)
RDW: 12.8 % (ref 11.5–15.5)
WBC: 11 10*3/uL — ABNORMAL HIGH (ref 4.0–10.5)
nRBC: 0 % (ref 0.0–0.2)

## 2020-11-27 LAB — COMPREHENSIVE METABOLIC PANEL
ALT: 10 U/L (ref 0–44)
AST: 15 U/L (ref 15–41)
Albumin: 3.7 g/dL (ref 3.5–5.0)
Alkaline Phosphatase: 59 U/L (ref 38–126)
Anion gap: 7 (ref 5–15)
BUN: 7 mg/dL (ref 6–20)
CO2: 27 mmol/L (ref 22–32)
Calcium: 9 mg/dL (ref 8.9–10.3)
Chloride: 105 mmol/L (ref 98–111)
Creatinine, Ser: 0.49 mg/dL (ref 0.44–1.00)
GFR, Estimated: >60 mL/min (ref >60)
Glucose, Bld: 92 mg/dL (ref 70–99)
Potassium: 3.8 mmol/L (ref 3.5–5.1)
Sodium: 139 mmol/L (ref 135–145)
Total Bilirubin: 0.6 mg/dL (ref 0.3–1.2)
Total Protein: 6.8 g/dL (ref 6.5–8.1)

## 2020-11-27 LAB — RESP PANEL BY RT-PCR (FLU A&B, COVID) ARPGX2
Influenza A by PCR: NEGATIVE
Influenza B by PCR: NEGATIVE
SARS Coronavirus 2 by RT PCR: NEGATIVE

## 2020-11-27 LAB — RAPID URINE DRUG SCREEN, HOSP PERFORMED
Amphetamines: NOT DETECTED
Barbiturates: NOT DETECTED
Benzodiazepines: NOT DETECTED
Cocaine: NOT DETECTED
Opiates: NOT DETECTED
Tetrahydrocannabinol: POSITIVE — AB

## 2020-11-27 LAB — I-STAT BETA HCG BLOOD, ED (MC, WL, AP ONLY): I-stat hCG, quantitative: <5 m[IU]/mL (ref <5)

## 2020-11-27 LAB — ETHANOL: Alcohol, Ethyl (B): <10 mg/dL (ref <10)

## 2020-11-27 MED ORDER — ARIPIPRAZOLE 5 MG PO TABS
5.0000 mg | ORAL_TABLET | Freq: Every day | ORAL | Status: DC
  Administered 2020-11-28: 5 mg via ORAL
  Filled 2020-11-27: qty 1

## 2020-11-27 MED ORDER — CLONAZEPAM 0.5 MG PO TABS
0.5000 mg | ORAL_TABLET | Freq: Once | ORAL | Status: AC
  Administered 2020-11-27: 0.5 mg via ORAL
  Filled 2020-11-27: qty 1

## 2020-11-27 MED ORDER — ACETAMINOPHEN 325 MG PO TABS
650.0000 mg | ORAL_TABLET | Freq: Once | ORAL | Status: AC
  Administered 2020-11-27: 650 mg via ORAL
  Filled 2020-11-27: qty 2

## 2020-11-27 MED ORDER — ACETAMINOPHEN 325 MG PO TABS
650.0000 mg | ORAL_TABLET | ORAL | Status: DC | PRN
Start: 2020-11-27 — End: 2020-11-28

## 2020-11-27 NOTE — ED Provider Notes (Signed)
[facility]-EMERGENCY DEPT Provider Note   CSN: [id number] Arrival date & time: 11/27/20  1505     History Chief Complaint  Patient presents with   Medical Clearance    [name] is a 40 y.o. female.  [name] is a 40 y.o. female with a history of ADHD, anxiety, and bipolar disorder, who presents to the emergency department from behavioral health hospital for medical clearance.  Patient reports that she went to [facility] voluntarily today for evaluation of SI.  She denies specific plan but reports over the past month she has been going through a lot of changes, and is separating from her husband and has had increasing suicidal thoughts.  She denies any HI or AVH.  Reports that her anxiety has been worsening as well.  Follow-up behavioral health today her blood pressure was noted to be elevated, she denies any prior history of hypertension and has never taken blood medications.  Denies any associated shortness of breath, chest pain, headache visual changes or lower extremity edema..  Patient reports that she has been recommended for inpatient treatment and they are planning to admit her at behavioral health hospital but she was sent here for medical clearance and further evaluation of her blood pressure prior to being admitted there.  She reports when her blood pressure was taken at behavioral health she was crying and upset.  The history is provided by the patient and medical records.      Past Medical History:  Diagnosis Date   Abnormal Pap smear    ADHD (attention deficit hyperactivity disorder)    Anemia    generalized anxiety disorder   Anxiety    Asthma    Blood transfusion    Pt delivery in 2009   Complication of anesthesia    Headache(784.0)    PPH (postpartum hemorrhage)    Spinal headache     Patient Active Problem List   Diagnosis Date Noted   Lower abdominal pain 09/10/2017   Bacterial vaginosis 07/02/2016   Health care maintenance  07/02/2016   Contraception management 04/05/2016   Frequent No-show for appointment 01/16/2016   Bruising 05/06/2015   De Quervain's tenosynovitis 04/15/2015   Right knee pain 04/15/2015   History of perforated ear drum 04/15/2015   Asthma with acute exacerbation 09/14/2014   Skin lesion of breast 09/14/2014   Thyroid nodule 09/14/2014   Rectal bleeding 09/14/2014   Bipolar disorder, curr episode mixed, severe, w/o psychotic features (HCC) 07/11/2014   Cannabis use disorder, moderate, dependence (HCC) 07/11/2014   Overweight (BMI 25.0-29.9) 10/12/2013   Menstrual cramp 03/24/2012   Asthma 06/28/2010   ADHD 06/13/2007    Past Surgical History:  Procedure Laterality Date   TONSILLECTOMY  2011     OB History     Gravida  3   Para  2   Term  2   Preterm      AB  1   Living  2      SAB      IAB      Ectopic  1   Multiple      Live Births  2           Family History  Problem Relation Age of Onset   Thrombophlebitis Mother    Hypertension Father    Heart disease Maternal Grandmother    Heart disease Paternal Grandfather    Hypertension Paternal Grandfather     Social History   Tobacco Use  Smoking status: Every Day    Packs/day: 1.00    Types: Cigarettes   Smokeless tobacco: Never  Vaping Use   Vaping Use: Never used  Substance Use Topics   Alcohol use: No   Drug use: Yes    Types: Marijuana, Amphetamines    Comment: 07/18/15  "smoked off and on since age 89"    Home Medications Prior to Admission medications   Medication Sig Start Date End Date Taking? Authorizing Provider  ARIPiprazole (ABILIFY) 5 MG tablet Take 5 mg by mouth at bedtime. 11/07/20  Yes [provider]  clonazePAM (KLONOPIN) 0.5 MG tablet Take 1 mg by mouth 2 (two) times daily.   Yes [provider]  traZODone (DESYREL) 100 MG tablet Take 1 tablet (100 mg total) by mouth at bedtime. For insomnia 07/16/14  Yes Nwoko, Nicole Kindred I, NP  VYVANSE 50 MG capsule Take  50 mg by mouth every morning. 11/07/20  Yes [provider]  Levonorgestrel-Ethinyl Estradiol (DAYSEE) 0.15-0.03 &0.01 MG tablet Take 1 tablet by mouth daily. Patient not taking: Reported on 11/27/2020 10/17/18   Leeroy Bock, MD  naproxen (NAPROSYN) 375 MG tablet Take 1 tablet (375 mg total) by mouth 2 (two) times daily. Patient not taking: Reported on 11/27/2020 09/08/17   Linus Mako B, NP  ondansetron (ZOFRAN) 4 MG tablet Take 1 tablet (4 mg total) by mouth every 8 (eight) hours as needed for nausea or vomiting. Patient not taking: Reported on 11/27/2020 09/08/17   Linus Mako B, NP    Allergies    Codeine, Gadolinium derivatives, Haldol [haloperidol], Lamictal [lamotrigine], Amoxicillin, Keflex [cephalexin], and Penicillins  Review of Systems   Review of Systems  Constitutional:  Negative for chills and fever.  HENT: Negative.    Respiratory:  Negative for cough and shortness of breath.   Cardiovascular:  Negative for chest pain.  Gastrointestinal:  Negative for abdominal pain, nausea and vomiting.  Genitourinary:  Negative for dysuria and frequency.  Musculoskeletal:  Negative for arthralgias and myalgias.  Skin:  Negative for color change and rash.  Psychiatric/Behavioral:  Positive for dysphoric mood and suicidal ideas. Negative for confusion and decreased concentration. The patient is nervous/anxious.   All other systems reviewed and are negative.  Physical Exam Updated Vital Signs BP 116/79 (BP Location: Left Arm)   Pulse 91   Temp 99 F (37.2 C) (Oral)   Resp 12   Ht 5\' 5"  (1.651 m)   Wt 64.8 kg   LMP 11/20/2020 (Approximate)   SpO2 99%   BMI 23.77 kg/m   Physical Exam Vitals and nursing note reviewed.  Constitutional:      General: She is not in acute distress.    Appearance: Normal appearance. She is well-developed and normal weight. She is not ill-appearing or diaphoretic.  HENT:     Head: Normocephalic and atraumatic.  Eyes:      General:        Right eye: No discharge.        Left eye: No discharge.     Pupils: Pupils are equal, round, and reactive to light.  Cardiovascular:     Rate and Rhythm: Normal rate and regular rhythm.     Pulses: Normal pulses.     Heart sounds: Normal heart sounds.  Pulmonary:     Effort: Pulmonary effort is normal. No respiratory distress.     Breath sounds: Normal breath sounds. No wheezing or rales.     Comments: Respirations equal and unlabored, patient able to  speak in full sentences, lungs clear to auscultation bilaterally  Abdominal:     General: Bowel sounds are normal. There is no distension.     Palpations: Abdomen is soft. There is no mass.     Tenderness: There is no abdominal tenderness. There is no guarding.     Comments: Abdomen soft, nondistended, nontender to palpation in all quadrants without guarding or peritoneal signs  Musculoskeletal:        General: No deformity.     Cervical back: Neck supple.  Skin:    General: Skin is warm and dry.     Capillary Refill: Capillary refill takes less than 2 seconds.  Neurological:     Mental Status: She is alert and oriented to person, place, and time.     Coordination: Coordination normal.     Comments: Speech is clear, able to follow commands Moves extremities without ataxia, coordination intact  Psychiatric:        Attention and Perception: Attention normal. She does not perceive auditory or visual hallucinations.        Mood and Affect: Mood is anxious and depressed. Affect is flat.        Speech: Speech normal.        Behavior: Behavior normal. Behavior is cooperative.        Thought Content: Thought content is not paranoid or delusional. Thought content includes suicidal ideation. Thought content does not include homicidal ideation. Thought content does not include suicidal plan.    ED Results / Procedures / Treatments   Labs (all labs ordered are listed, but only abnormal results are displayed) Labs Reviewed   RAPID URINE DRUG SCREEN, HOSP PERFORMED - Abnormal; Notable for the following components:      Result Value   Tetrahydrocannabinol POSITIVE (*)    All other components within normal limits  CBC WITH DIFFERENTIAL/PLATELET - Abnormal; Notable for the following components:   WBC 11.0 (*)    All other components within normal limits  RESP PANEL BY RT-PCR (FLU A&B, COVID) ARPGX2  COMPREHENSIVE METABOLIC PANEL  ETHANOL  I-STAT BETA HCG BLOOD, ED ([facility], [facility], [facility] ONLY)    EKG None  Radiology No results found.  Procedures Procedures   Medications Ordered in ED Medications  acetaminophen (TYLENOL) tablet 650 mg (has no administration in time range)  acetaminophen (TYLENOL) tablet 650 mg (650 mg Oral Given 11/27/20 1617)  clonazePAM (KLONOPIN) tablet 0.5 mg (0.5 mg Oral Given 11/27/20 1617)    ED Course  I have reviewed the triage vital signs and the nursing notes.  Pertinent labs & imaging results that were available during my care of the patient were reviewed by me and considered in my medical decision making (see chart for details).    MDM Rules/Calculators/A&P                           Patient arrives from behavioral health hospital for medical clearance, was seen there today for suicidal ideations without plan, symptoms worsening over the past month with multiple stressors.  Noted to be hypertensive at [facility], but blood pressure was taken while patient was crying and upset, here patient is normotensive and has not had any abnormal blood pressure readings.  No prior history of hypertension and has never been on medication for this.  No associated symptoms of chest pain, shortness of breath, lower extremity swelling, headaches or visual changes.  COVID swab ordered, will go ahead and order medical clearance labs  but feel the patient is medically cleared to go over to [facility] as soon as her bed is ready.  I have independently ordered, reviewed and interpreted all labs and imaging: Mild  leukocytosis but no other significant electrolyte derangements.  UDS positive for THC but otherwise unremarkable.  COVID and flu are negative.  EKG with sinus rhythm with some early repol  At this time patient is medically cleared, she is voluntary and awaiting bed assignment at behavioral health.  I have discussed care with NP Maxie Barb who evaluated patient initially.  The patient has been placed in psychiatric observation due to the need to provide a safe environment for the patient while obtaining psychiatric consultation and evaluation, as well as ongoing medical and medication management to treat the patient's condition.  The patient has not been placed under full IVC at this time.    Final Clinical Impression(s) / ED Diagnoses Final diagnoses:  Suicidal ideation    Rx / DC Orders ED Discharge Orders     None        Legrand Rams 11/27/20 Silva Bandy    Gerhard Munch, MD 11/30/20 1059
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
## 2020-11-27 NOTE — ED Notes (Signed)
Pt changed into wine colored scrubs.  Her belongings consist of a blue top, blue jeans, and a red bra in 1 bag that is now in the cabinet.
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
## 2020-11-27 NOTE — ED Notes (Signed)
Contacted [facility] AC Joanne regarding patient's admission status back to [facility].BH AC stated she would have to look into what unit she is going to and call back

## 2020-11-27 NOTE — ED Notes (Signed)
Patient will be admitted to the service of Dr. Mason Jim tomorrow. She will be going to room 402-2. Report can be called [phone].

## 2020-11-27 NOTE — ED Triage Notes (Signed)
Patient sent by [facility] for medical clearance. Pt went to [facility]  voluntarily for SI with no plans. Patient sent to ED for elevated BP and Covid swab

## 2020-11-28 ENCOUNTER — Encounter (HOSPITAL_COMMUNITY): Payer: Self-pay | Admitting: Psychiatry

## 2020-11-28 ENCOUNTER — Inpatient Hospital Stay (HOSPITAL_COMMUNITY)
Admission: RE | Admit: 2020-11-28 | Discharge: 2020-12-08 | DRG: 885 | Disposition: A | Discharge status: Home / Self Care | Payer: Medicaid Other | Source: Intra-hospital | Attending: Emergency Medicine | Admitting: Emergency Medicine

## 2020-11-28 DIAGNOSIS — Z9151 Personal history of suicidal behavior: Secondary | ICD-10-CM

## 2020-11-28 DIAGNOSIS — R030 Elevated blood-pressure reading, without diagnosis of hypertension: Secondary | ICD-10-CM | POA: Diagnosis present

## 2020-11-28 DIAGNOSIS — G4700 Insomnia, unspecified: Secondary | ICD-10-CM | POA: Diagnosis present

## 2020-11-28 DIAGNOSIS — Z880 Allergy status to penicillin: Secondary | ICD-10-CM | POA: Diagnosis not present

## 2020-11-28 DIAGNOSIS — K5900 Constipation, unspecified: Secondary | ICD-10-CM | POA: Diagnosis present

## 2020-11-28 DIAGNOSIS — F17210 Nicotine dependence, cigarettes, uncomplicated: Secondary | ICD-10-CM | POA: Diagnosis present

## 2020-11-28 DIAGNOSIS — F3163 Bipolar disorder, current episode mixed, severe, without psychotic features: Principal | ICD-10-CM | POA: Diagnosis present

## 2020-11-28 DIAGNOSIS — R45851 Suicidal ideations: Secondary | ICD-10-CM | POA: Diagnosis not present

## 2020-11-28 DIAGNOSIS — Z79899 Other long term (current) drug therapy: Secondary | ICD-10-CM | POA: Diagnosis not present

## 2020-11-28 DIAGNOSIS — Z23 Encounter for immunization: Secondary | ICD-10-CM | POA: Diagnosis not present

## 2020-11-28 DIAGNOSIS — Z888 Allergy status to other drugs, medicaments and biological substances status: Secondary | ICD-10-CM | POA: Diagnosis not present

## 2020-11-28 DIAGNOSIS — F411 Generalized anxiety disorder: Secondary | ICD-10-CM | POA: Diagnosis present

## 2020-11-28 DIAGNOSIS — Z885 Allergy status to narcotic agent status: Secondary | ICD-10-CM | POA: Diagnosis not present

## 2020-11-28 DIAGNOSIS — Z20822 Contact with and (suspected) exposure to covid-19: Secondary | ICD-10-CM | POA: Diagnosis present

## 2020-11-28 DIAGNOSIS — Z881 Allergy status to other antibiotic agents status: Secondary | ICD-10-CM | POA: Diagnosis not present

## 2020-11-28 DIAGNOSIS — Z8249 Family history of ischemic heart disease and other diseases of the circulatory system: Secondary | ICD-10-CM

## 2020-11-28 DIAGNOSIS — Z635 Disruption of family by separation and divorce: Secondary | ICD-10-CM

## 2020-11-28 DIAGNOSIS — F322 Major depressive disorder, single episode, severe without psychotic features: Secondary | ICD-10-CM | POA: Insufficient documentation

## 2020-11-28 MED ORDER — MAGNESIUM HYDROXIDE 400 MG/5ML PO SUSP
5.0000 mL | Freq: Every day | ORAL | Status: DC | PRN
  Administered 2020-11-28 – 2020-12-06 (×6): 5 mL via ORAL
  Filled 2020-11-28 (×8): qty 30

## 2020-11-28 MED ORDER — INFLUENZA VAC SPLIT QUAD 0.5 ML IM SUSY
0.5000 mL | PREFILLED_SYRINGE | INTRAMUSCULAR | Status: AC
  Administered 2020-11-29: 0.5 mL via INTRAMUSCULAR
  Filled 2020-11-28: qty 0.5

## 2020-11-28 MED ORDER — QUETIAPINE FUMARATE 100 MG PO TABS
100.0000 mg | ORAL_TABLET | Freq: Every day | ORAL | Status: DC
  Administered 2020-11-28 – 2020-11-29 (×2): 100 mg via ORAL
  Filled 2020-11-28 (×5): qty 1

## 2020-11-28 MED ORDER — HYDROXYZINE HCL 25 MG PO TABS
25.0000 mg | ORAL_TABLET | Freq: Three times a day (TID) | ORAL | Status: DC | PRN
  Administered 2020-11-28 – 2020-12-08 (×12): 25 mg via ORAL
  Filled 2020-11-28 (×13): qty 1

## 2020-11-28 MED ORDER — ALUM & MAG HYDROXIDE-SIMETH 200-200-20 MG/5ML PO SUSP
15.0000 mL | Freq: Four times a day (QID) | ORAL | Status: DC | PRN
  Administered 2020-11-29: 15 mL via ORAL

## 2020-11-28 MED ORDER — TRAZODONE HCL 100 MG PO TABS
100.0000 mg | ORAL_TABLET | Freq: Every day | ORAL | Status: DC
  Administered 2020-11-28: 100 mg via ORAL
  Filled 2020-11-28: qty 1

## 2020-11-28 MED ORDER — ARIPIPRAZOLE 5 MG PO TABS
5.0000 mg | ORAL_TABLET | Freq: Every day | ORAL | Status: DC
  Filled 2020-11-28 (×3): qty 1

## 2020-11-28 MED ORDER — NICOTINE POLACRILEX 2 MG MT GUM
2.0000 mg | CHEWING_GUM | OROMUCOSAL | Status: DC | PRN
  Administered 2020-11-28: 2 mg via ORAL

## 2020-11-28 MED ORDER — QUETIAPINE FUMARATE 50 MG PO TABS
50.0000 mg | ORAL_TABLET | Freq: Every day | ORAL | Status: DC
  Filled 2020-11-28: qty 1

## 2020-11-28 MED ORDER — TRAZODONE HCL 50 MG PO TABS: 50.0000 mg | ORAL_TABLET | Freq: Every evening | ORAL | Status: DC | PRN

## 2020-11-28 MED ORDER — FLEET ENEMA 7-19 GM/118ML RE ENEM
1.0000 | ENEMA | Freq: Once | RECTAL | Status: DC
  Filled 2020-11-28 (×2): qty 1

## 2020-11-28 MED ORDER — MIRTAZAPINE 7.5 MG PO TABS
7.5000 mg | ORAL_TABLET | Freq: Every day | ORAL | Status: DC
  Administered 2020-11-28 – 2020-12-01 (×4): 7.5 mg via ORAL
  Filled 2020-11-28 (×7): qty 1

## 2020-11-28 MED ORDER — NICOTINE 21 MG/24HR TD PT24
21.0000 mg | MEDICATED_PATCH | Freq: Every day | TRANSDERMAL | Status: DC
  Administered 2020-11-29 – 2020-12-08 (×10): 21 mg via TRANSDERMAL
  Filled 2020-11-28 (×14): qty 1

## 2020-11-28 MED ORDER — CLONAZEPAM 0.5 MG PO TABS
0.5000 mg | ORAL_TABLET | Freq: Two times a day (BID) | ORAL | Status: DC
  Administered 2020-11-28 (×2): 0.5 mg via ORAL
  Filled 2020-11-28 (×2): qty 1

## 2020-11-28 MED ORDER — POLYETHYLENE GLYCOL 3350 17 G PO PACK
17.0000 g | PACK | Freq: Every day | ORAL | Status: DC
  Administered 2020-11-28 – 2020-12-07 (×10): 17 g via ORAL
  Filled 2020-11-28 (×14): qty 1

## 2020-11-28 MED ORDER — ACETAMINOPHEN 325 MG PO TABS: 650.0000 mg | ORAL_TABLET | Freq: Four times a day (QID) | ORAL | Status: DC | PRN

## 2020-11-28 NOTE — Group Note (Signed)
LCSW Group Therapy Note   Group Date: 11/28/2020 Start Time: 1300 End Time: 1400   Type of Therapy and Topic:  Group Therapy:  Fear  Participation Level:  Did Not Attend  Description of Group: In this process group, patients discussed things that make feel them feel nervous or scared.  Patients discussed what they think about when they feel this emotions.  Patients were given the opportunity to share openly and support each others.  The group discussed where they feel their fears in their body.  Patients were encouraged to identify new coping mechanisms to use when fears arise.  Therapeutic Goals Patient will verbalize fears and what thoughts occur when they feel this emotion. Patients will verbalize where on their body they feel fear. Patients will explore coping mechanisms they can use in the future when fear arises.   Summary of Patient Progress:  Did not attend   Therapeutic Modalities Cognitive Behavioral Therapy Motivational Interviewing  [name], [place] 11/28/2020  2:01 PM

## 2020-11-28 NOTE — Tx Team (Addendum)
Initial Treatment Plan 11/28/2020 12:04 PM KRISSIE MERRICK QIW:[id number]    PATIENT STRESSORS: Legal issue   Marital or family conflict   Substance abuse   Traumatic event     PATIENT STRENGTHS: Capable of independent living  Education administrator  Supportive family/friends    PATIENT IDENTIFIED PROBLEMS: Alterations in mood "I'm depressed, I'm anxious and I'm really sad because of the separation".    Risk for self harm (Passive SI with h/o suicidal attempt)   Substance Abuse "I smoke marijuana quite a bit"    Grieving (Separation from family), lost of relationship from mom, husband & children.           DISCHARGE CRITERIA:  Improved stabilization in mood, thinking, and/or behavior Verbal commitment to aftercare and medication compliance  PRELIMINARY DISCHARGE PLAN: Outpatient therapy Return to previous living arrangement  PATIENT/FAMILY INVOLVEMENT: This treatment plan has been presented to and reviewed with the patient, KINGSLEY HERANDEZ.  The patient  have been given the opportunity to ask questions and make suggestions.  Sherryl Manges, RN 11/28/2020, 12:04 PM

## 2020-11-28 NOTE — [facility] Group Notes (Signed)
[facility] Group Notes:  (Nursing/MHT/Case Management/Adjunct)  Date:  11/28/2020  Time:  9:49 PM  Type of Therapy:  Group Therapy  Participation Level:  None  Participation Quality:  Resistant  Affect:  Resistant  Cognitive:  Lacking  Insight:  None  Engagement in Group:  Resistant  Modes of Intervention:  Education  Summary of Progress/Problems: The patient did not attend group this evening since she was asleep in her bedroom.   Hazle Coca S 11/28/2020, 9:49 PM
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
## 2020-11-28 NOTE — ED Notes (Signed)
Attempted report to [facility] again. RN still in med pass and will not take report. Secretary states she has given RN my name/number.

## 2020-11-28 NOTE — Progress Notes (Signed)
Patient stated she had a BM after dinner and does not need the enema now.   Patient will let us know when she needs the enema.
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
## 2020-11-28 NOTE — Progress Notes (Signed)
Pt kept to herself, pt continues to be depressed    11/28/20 2000  Psych Admission Type (Psych Patients Only)  Admission Status Voluntary  Psychosocial Assessment  Patient Complaints Anxiety;Depression  Eye Contact Fair  Facial Expression Flat;Sad  Affect Appropriate to circumstance  Speech Soft;Logical/coherent  Interaction Assertive  Motor Activity Fidgety;Restless  Appearance/Hygiene Unremarkable  Behavior Characteristics Cooperative  Mood Depressed  Aggressive Behavior  Targets Self  Type of Behavior Verbal  Effect No apparent injury  Thought Process  Coherency Circumstantial  Content Blaming self;Blaming others  Delusions None reported or observed  Perception WDL  Hallucination None reported or observed  Judgment Impaired  Confusion None  Danger to Self  Current suicidal ideation? Passive  Self-Injurious Behavior No self-injurious ideation or behavior indicators observed or expressed   Agreement Not to Harm Self Yes  Description of Agreement Pt verbally contracts for safety  Danger to Others  Danger to Others None reported or observed

## 2020-11-28 NOTE — ED Notes (Signed)
Attempted report to [facility], they are now in med pass and will not take report. They requested call back in 1 hour. I additionally left name and number for call.
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
## 2020-11-28 NOTE — Group Note (Signed)
Occupational Therapy Group Note  Group Topic:Feelings Management  Group Date: 11/28/2020 Start Time: 1400 End Time: 1445 Facilitators: Ceaser Ebeling, OT/L    Group Description: Group encouraged increased participation and engagement through discussion focused on STRENGTHS. Patients were encouraged to fill out a worksheet to structure discussion, identifying ten strengths that they currently have. Patients were then instructed to utilize various symbols to identify which strengths help them in their relationships, school/profession, and leisure participation. Discussion also focused on identifying one strength they do not currently have but would like. Discussion within the group followed with patients sharing their responses and highlighting their own personal strengths.   Therapeutic Goals: Identify strengths vs weaknesses Discuss and identify ways we can highlight our strengths     Participation Level: Did not attend   Plan: Continue to engage patient in OT groups 2 - 3x/week.  11/28/2020  [name], OT/L 

## 2020-11-28 NOTE — ED Notes (Signed)
Attempted report to [facility], they are still in report and will not take report. Will call back in 15

## 2020-11-28 NOTE — ED Notes (Signed)
Attempted report to [facility] again. They remain unavailable to receive report.

## 2020-11-29 ENCOUNTER — Encounter (HOSPITAL_COMMUNITY): Payer: Self-pay | Admitting: Psychiatry

## 2020-11-29 LAB — LIPID PANEL
Cholesterol: 159 mg/dL (ref 0–200)
HDL: 39 mg/dL — ABNORMAL LOW (ref >40)
LDL Cholesterol: 102 mg/dL — ABNORMAL HIGH (ref 0–99)
Total CHOL/HDL Ratio: 4.1 RATIO
Triglycerides: 89 mg/dL (ref <150)
VLDL: 18 mg/dL (ref 0–40)

## 2020-11-29 LAB — HEMOGLOBIN A1C
Hgb A1c MFr Bld: 5.3 % (ref 4.8–5.6)
Mean Plasma Glucose: 105.41 mg/dL

## 2020-11-29 LAB — TSH: TSH: 1.326 u[IU]/mL (ref 0.350–4.500)

## 2020-11-29 MED ORDER — CLONAZEPAM 1 MG PO TABS
1.0000 mg | ORAL_TABLET | Freq: Two times a day (BID) | ORAL | Status: DC
  Administered 2020-11-29 – 2020-11-30 (×3): 1 mg via ORAL
  Filled 2020-11-29 (×3): qty 1

## 2020-11-29 MED ORDER — DOCUSATE SODIUM 100 MG PO CAPS
100.0000 mg | ORAL_CAPSULE | Freq: Every day | ORAL | Status: DC | PRN
  Administered 2020-11-29 – 2020-12-06 (×3): 100 mg via ORAL
  Filled 2020-11-29 (×4): qty 1

## 2020-11-29 NOTE — Progress Notes (Signed)
Pt did not attend group due to important phone call as per nurse permission.

## 2020-11-29 NOTE — Progress Notes (Signed)
[facility] MD Progress Note  11/29/2020 11:04 AM RENICA CRAIL  MRN:  [id number]  In short :Mrs. Venezia is a 40 yr old female who presents with worsening depression and SI. PPHx is significant for Bipolar Disorder - mixed with psychotic features, ADHD, GAD, 1 previous suicide attempt (ingestion), 4 previous hospitalizations (latest [facility] 2016).  Subjective:  Pt is seen and examined today.  Patient mood is labile and her affect is tearful and depressed.  Pt states her mood is depressed. Pt rates her depression at 10/10 (10 is the worst depression). Pt slept well last night. She states Seroquel helps her with sleep. Nursing notes indicate that Pt slept for 6.5 hours. Pt states her appetite is good and she has been eating her meals as well as snacks. Pt rates her anxiety at 10/10 (10 is the worst anxiety) .  Patient states she had been taking Xanax for many years and feels very anxious without Xanax.  Patient requests to start Xanax again.  She states she talked to her mom on phone yesterday and told her that she misses her and her family.  She states she is separated from her husband and her husband put restraining order against her.  She has 2 kids who are 82 and 40-year-old.  Currently, Pt endorses passive suicidal ideation without a plan or intent.  She contracts for safety on the unit.  She denies homicidal ideation and, visual and auditory hallucination.  She reports constipation.  She states that she had been taking MiraLAX and milk of magnesia and got enema yesterday but still she feels like backed up.  Discussed adding Colace.  She agrees.  Pt denies any headache, nausea, vomiting, dizziness, chest pain, SOB, abdominal pain, diarrhea..  Pt denies any medication side effects and has been tolerating it well. Pt denies any concerns.  Principal Problem: Bipolar disorder, curr episode mixed, severe, w/o psychotic features (HCC) Diagnosis: Principal Problem:   Bipolar disorder, curr episode mixed, severe, w/o  psychotic features (HCC)  Total Time spent with patient: 20 minutes  Past Psychiatric History: See H&P  Past Medical History:  Past Medical History:  Diagnosis Date   Abnormal Pap smear    ADHD (attention deficit hyperactivity disorder)    Anemia    generalized anxiety disorder   Anxiety    Asthma    Blood transfusion    Pt delivery in 2009   Complication of anesthesia    Headache(784.0)    PPH (postpartum hemorrhage)    Spinal headache     Past Surgical History:  Procedure Laterality Date   TONSILLECTOMY  2011   Family History:  Family History  Problem Relation Age of Onset   Thrombophlebitis Mother    Hypertension Father    Heart disease Maternal Grandmother    Heart disease Paternal Grandfather    Hypertension Paternal Grandfather    Family Psychiatric  History: See H&P Social History:  Social History   Substance and Sexual Activity  Alcohol Use No     Social History   Substance and Sexual Activity  Drug Use Yes   Types: Marijuana, Amphetamines   Comment: 07/18/15  "smoked off and on since age 15"    Social History   Socioeconomic History   Marital status: Legally Separated    Spouse name: Not on file   Number of children: 2   Years of education: 18   Highest education level: Not on file  Occupational History    Comment: home maker  Tobacco Use  Smoking status: Every Day    Packs/day: 1.00    Types: Cigarettes   Smokeless tobacco: Never  Vaping Use   Vaping Use: Never used  Substance and Sexual Activity   Alcohol use: No   Drug use: Yes    Types: Marijuana, Amphetamines    Comment: 07/18/15  "smoked off and on since age 46"   Sexual activity: Not Currently    Comment: Pt currently pregnant  Other Topics Concern   Not on file  Social History Narrative   Lives with mother and children    caffeine use- coffee 1 cup in morning   Social Determinants of Health   Financial Resource Strain: Not on file  Food Insecurity: Not on file   Transportation Needs: Not on file  Physical Activity: Not on file  Stress: Not on file  Social Connections: Not on file   Additional Social History:                         Sleep: Good  Appetite:  Good  Current Medications: Current Facility-Administered Medications  Medication Dose Route Frequency Provider Last Rate Last Admin   acetaminophen (TYLENOL) tablet 650 mg  650 mg Oral Q6H PRN Mason Jim, Amy E, MD       alum & mag hydroxide-simeth (MAALOX/MYLANTA) 200-200-20 MG/5ML suspension 15 mL  15 mL Oral Q6H PRN Mason Jim, Amy E, MD       clonazePAM (KLONOPIN) tablet 1 mg  1 mg Oral BID Karsten Ro, MD       docusate sodium (COLACE) capsule 100 mg  100 mg Oral Daily PRN Karsten Ro, MD       hydrOXYzine (ATARAX/VISTARIL) tablet 25 mg  25 mg Oral TID PRN Comer Locket, MD   25 mg at 11/29/20 0813   magnesium hydroxide (MILK OF MAGNESIA) suspension 5 mL  5 mL Oral Daily PRN Comer Locket, MD   5 mL at 11/28/20 1456   mirtazapine (REMERON) tablet 7.5 mg  7.5 mg Oral QHS Lauro Franklin, MD   7.5 mg at 11/28/20 2148   nicotine (NICODERM CQ - dosed in mg/24 hours) patch 21 mg  21 mg Transdermal Daily Mason Jim, Amy E, MD   21 mg at 11/29/20 0827   polyethylene glycol (MIRALAX / GLYCOLAX) packet 17 g  17 g Oral Daily Mason Jim, Amy E, MD   17 g at 11/29/20 0827   QUEtiapine (SEROQUEL) tablet 100 mg  100 mg Oral QHS Lauro Franklin, MD   100 mg at 11/28/20 2148   sodium phosphate (FLEET) 7-19 GM/118ML enema 1 enema  1 enema Rectal Once Lauro Franklin, MD       traZODone (DESYREL) tablet 50 mg  50 mg Oral QHS PRN Comer Locket, MD        Lab Results:  Results for orders placed or performed during the hospital encounter of 11/28/20 (from the past 48 hour(s))  Lipid panel     Status: Abnormal   Collection Time: 11/29/20  6:20 AM  Result Value Ref Range   Cholesterol 159 0 - 200 mg/dL   Triglycerides 89 <841 mg/dL   HDL 39 (L) >66 mg/dL   Total  CHOL/HDL Ratio 4.1 RATIO   VLDL 18 0 - 40 mg/dL   LDL Cholesterol 063 (H) 0 - 99 mg/dL    Comment:        Total Cholesterol/HDL:CHD Risk Coronary Heart Disease Risk Table  Men   Women  1/2 Average Risk   3.4   3.3  Average Risk       5.0   4.4  2 X Average Risk   9.6   7.1  3 X Average Risk  23.4   11.0        Use the calculated Patient Ratio above and the CHD Risk Table to determine the patient's CHD Risk.        ATP III CLASSIFICATION (LDL):  <100     mg/dL   Optimal  818-563  mg/dL   Near or Above                    Optimal  130-159  mg/dL   Borderline  149-702  mg/dL   High  >637     mg/dL   Very High Performed at [facility], [address], [place], [place] [address]   Hemoglobin A1c     Status: None   Collection Time: 11/29/20  6:20 AM  Result Value Ref Range   Hgb A1c MFr Bld 5.3 4.8 - 5.6 %    Comment: (NOTE) Pre diabetes:          5.7%-6.4%  Diabetes:              >6.4%  Glycemic control for   <7.0% adults with diabetes    Mean Plasma Glucose 105.41 mg/dL    Comment: Performed at [facility], [address], [place], [place] [address]  TSH     Status: None   Collection Time: 11/29/20  6:20 AM  Result Value Ref Range   TSH 1.326 0.350 - 4.500 uIU/mL    Comment: Performed by a 3rd Generation assay with a functional sensitivity of <=0.01 uIU/mL. Performed at [facility], [address] [place], [place] [address]     Blood Alcohol level:  Lab Results  Component Value Date   [facility] <10 11/27/2020   ETH <5 07/10/2014    Metabolic Disorder Labs: Lab Results  Component Value Date   HGBA1C 5.3 11/29/2020   MPG 105.41 11/29/2020   MPG 117 05/27/2014   No results found for: PROLACTIN Lab Results  Component Value Date   CHOL 159 11/29/2020   TRIG 89 11/29/2020   HDL 39 (L) 11/29/2020   CHOLHDL 4.1 11/29/2020   VLDL 18 11/29/2020   LDLCALC 102 (H) 11/29/2020   LDLCALC 88 05/27/2014     Physical Findings: AIMS: Facial and Oral Movements Muscles of Facial Expression: None, normal Lips and Perioral Area: None, normal Jaw: None, normal Tongue: None, normal,Extremity Movements Upper (arms, wrists, hands, fingers): None, normal Lower (legs, knees, ankles, toes): None, normal, Trunk Movements Neck, shoulders, hips: None, normal, Overall Severity Severity of abnormal movements (highest score from questions above): None, normal Incapacitation due to abnormal movements: None, normal Patient's awareness of abnormal movements (rate only patient's report): No Awareness, Dental Status Current problems with teeth and/or dentures?: No Does patient usually wear dentures?: No  CIWA:    COWS:     Musculoskeletal: Strength & Muscle Tone: within normal limits Gait & Station: normal Patient leans: N/A  Psychiatric Specialty Exam:  Presentation  General Appearance: Appropriate for Environment  Eye Contact:Fair  Speech:Clear and Coherent  Speech Volume:Normal  Handedness:No data recorded  Mood and Affect  Mood:Anxious; Depressed; Labile  Affect:Tearful; Labile; Congruent; Depressed   Thought Process  Thought Processes:Goal Directed  Descriptions of Associations:Tangential  Orientation:Full (Time, Place and  Person)  Thought Content:Rumination  History of Schizophrenia/Schizoaffective disorder:No  Duration of Psychotic Symptoms:No data recorded Hallucinations:Hallucinations: None  Ideas of Reference:None  Suicidal Thoughts:Suicidal Thoughts: Yes, Passive SI Active Intent and/or Plan: Without Intent; Without Plan SI Passive Intent and/or Plan: Without Intent; Without Plan; Without Means to Carry Out  Homicidal Thoughts:Homicidal Thoughts: No   Sensorium  Memory:Immediate Fair; Recent Fair  Judgment:Poor  Insight:Poor   Executive Functions  Concentration:Fair  Attention Span:Fair  Recall:Fair  Fund of  Knowledge:Good  Language:Good   Psychomotor Activity  Psychomotor Activity:Psychomotor Activity: Increased   Assets  Assets:Desire for Improvement; Resilience; Communication Skills; Physical Health   Sleep  Sleep:Sleep: Good Number of Hours of Sleep: 6.5    Physical Exam: Physical Exam Vitals and nursing note reviewed.  Constitutional:      Appearance: Normal appearance. She is not ill-appearing, toxic-appearing or diaphoretic.  HENT:     Head: Normocephalic and atraumatic.  Pulmonary:     Effort: Pulmonary effort is normal.  Neurological:     General: No focal deficit present.     Mental Status: She is alert and oriented to person, place, and time.   Review of Systems  Constitutional:  Negative for chills and fever.  HENT:  Negative for hearing loss.   Eyes:  Negative for blurred vision.  Respiratory:  Negative for cough and shortness of breath.   Cardiovascular:  Negative for chest pain.  Gastrointestinal:  Positive for constipation. Negative for abdominal pain, nausea and vomiting.  Neurological:  Negative for dizziness, weakness and headaches.  Psychiatric/Behavioral:  Positive for depression and suicidal ideas. Negative for hallucinations. The patient is nervous/anxious. The patient does not have insomnia.   Blood pressure 99/73, pulse 93, temperature 97.8 F (36.6 C), temperature source Oral, resp. rate 18, height 5\' 5"  (1.651 m), weight 64.4 kg, last menstrual period 11/20/2020, SpO2 100 %. Body mass index is 23.63 kg/m.   Treatment Plan Summary: Daily contact with patient to assess and evaluate symptoms and progress in treatment Mrs. Jaffe is a 40 yr old female who presents with worsening depression and SI. PPHx is significant for Bipolar Disorder - mixed with psychotic features, ADHD, GAD, 1 previous suicide attempt (ingestion), 4 previous hospitalizations (latest [facility] 2016).   Bipolar Disorder, mixed Episode (r/out with psychotic features): -Continue  Seroquel 100mg  qhs -will titrate Seroquel over coming days -EKG QTC 454, lipid profile shows cholesterol 159, LDL 102, triglycerides 89, HDL 39 and A1c 5.3  -Continue Remeron 7.5 mg at bedtime for depression. -CSW to check if patient is with ACTT services currently.  GAD by hx: -Continue  Klonopin 1 mg BID - verified dosing in PDMP at the time of admission .  It was not started yesterday, will start it today.   ADHD by hx:  - Holding Vyvanse given concern for mixed manic episode - medication verified in PDMP    Cannabis use - episodic (r/o cannabis use d/o) - Counseled on need to abstain from substance use - UDS positive for THC   Constipation: -Continue Miralax daily - Add Colace 100 mg as needed as needed for constipation. -Will attempt to verify home Linzess dosing. -Will consider ordering x-ray abdomen tomorrow if Colace does not help.    -Continue PRN's: Tylenol, Maalox, Atarax, Milk of Magnesia, Trazodone  2017, MD 11/29/2020, 11:04 AM

## 2020-11-29 NOTE — BHH Counselor (Signed)
CSW spoke with [facility] [phone] to ask about the Pt's ACTT services.  CSW was informed that the Pt had an ACTT team approximately 1 year ago but asked to be stepped down to medication management and then stopped coming to services completely.  The individual at [facility] states that the Pt can return to the ACTT team.  She states that prior to discharge the CSW can contact [facility] and speak with Mrs. Mathis and they will schedule an intake time with the Pt for the same day of discharge or the next day.  CSW will contact Mrs. Betsey Holiday once a discharge date is determined.

## 2020-11-29 NOTE — Progress Notes (Signed)
D: Patient appears anxious and restless this morning. She rates her hopeless, depression and anxiety as a 10. Her goal today is to work on "my suicidal thoughts, being able to sit still, and my relationship with my mother." She states that she is suicidal without a specific plan. When asked if she feels safe on the unit, she states, "I feel safe here." She requested vistaril for anxiety with no relief. She missed group this morning because of an important phone call she had to make. Patient hopes to "stay strong"; she states the suicidal thoughts "scare me." She denies any AVH.  A: Continue to monitor medication management and MD orders.  Safety checks completed every 15 minutes per protocol.  Offer support and encouragement as needed.  R: Patient is receptive to staff; her behavior is appropriate.     11/29/20 1100  Psych Admission Type (Psych Patients Only)  Admission Status Voluntary  Psychosocial Assessment  Patient Complaints Anxiety;Depression  Eye Contact Fair  Facial Expression Flat;Anxious;Animated  Affect Appropriate to circumstance  Furniture conservator/restorer;Restless  Appearance/Hygiene Unremarkable  Behavior Characteristics Cooperative  Mood Anxious  Thought Process  Coherency Circumstantial  Content Blaming self  Delusions None reported or observed  Perception WDL  Hallucination None reported or observed  Judgment Impaired  Confusion None  Danger to Self  Current suicidal ideation? Passive  Self-Injurious Behavior No self-injurious ideation or behavior indicators observed or expressed   Agreement Not to Harm Self Yes  Description of Agreement verbal  Danger to Others  Danger to Others None reported or observed
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
## 2020-11-29 NOTE — BHH Counselor (Signed)
CSW made an attempt to contact the Pt's mother, Mylynn Dinh [phone] to complete SPE.  There was no answer.  A HIPAA approved voicemail was left requesting a return call.

## 2020-11-29 NOTE — BHH Group Notes (Signed)
BH Group Notes:  (Nursing/MT/Case Management/Adjunct)  Date:  11/29/2020  Time:  9:36 PM  Type of Therapy:  Psychoeducational Skills  Participation Level:  Active  Participation Quality:  Attentive  Affect:  Blunted  Cognitive:  Appropriate  Insight:  Appropriate  Engagement in Group:  Developing/Improving  Modes of Intervention:  Education  Summary of Progress/Problems: The patient's positive event for the day is that she smiled more today and went outside. Her goal for tomorrow is to be positive.  Hazle Coca S 11/29/2020, 9:36 PM

## 2020-11-29 NOTE — Group Note (Signed)
Recreation Therapy Group Note   Group Topic:Animal Assisted Therapy   Group Date: 11/29/2020 Start Time: 1430 End Time: 1500 Facilitators: Caroll Rancher, LRT/CTRS Location: [address]  AAA/T Program Assumption of Risk Form signed by Patient/ or Parent Legal Guardian Yes  Patient is free of allergies or severe asthma Yes  Patient reports no fear of animals Yes  Patient reports no history of cruelty to animals Yes  Patient understands his/her participation is voluntary Yes  Patient washes hands before animal contact Yes  Patient washes hands after animal contact Yes   Affect/Mood: Appropriate   Participation Level: Engaged   Participation Quality: Independent   Behavior: Appropriate   Speech/Thought Process: Focused   Insight: Good   Judgement: Good   Modes of Intervention: Teaching laboratory technician   Patient Response to Interventions:  Engaged   Education Outcome:  Acknowledges education and In group clarification offered    Clinical Observations/Individualized Feedback:  Pt attended and participated.  Pt engaged with therapy dog and asked questions.    Plan: Continue to engage patient in RT group sessions 2-3x/week.   Caroll Rancher, LRT/CTRS 11/29/2020 4:00 PM
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
## 2020-11-30 ENCOUNTER — Encounter (HOSPITAL_COMMUNITY): Payer: Self-pay

## 2020-11-30 MED ORDER — QUETIAPINE FUMARATE 50 MG PO TABS
150.0000 mg | ORAL_TABLET | Freq: Every day | ORAL | Status: DC
  Administered 2020-11-30 – 2020-12-01 (×2): 150 mg via ORAL
  Filled 2020-11-30 (×4): qty 1

## 2020-11-30 MED ORDER — QUETIAPINE FUMARATE 25 MG PO TABS
25.0000 mg | ORAL_TABLET | Freq: Every day | ORAL | Status: DC
  Administered 2020-11-30 – 2020-12-02 (×3): 25 mg via ORAL
  Filled 2020-11-30 (×7): qty 1

## 2020-11-30 MED ORDER — CLONAZEPAM 1 MG PO TABS
1.0000 mg | ORAL_TABLET | Freq: Every day | ORAL | Status: DC
  Administered 2020-12-01: 1 mg via ORAL
  Filled 2020-11-30: qty 1

## 2020-11-30 MED ORDER — FLEET ENEMA 7-19 GM/118ML RE ENEM
1.0000 | ENEMA | Freq: Once | RECTAL | Status: AC
  Administered 2020-11-30: 1 via RECTAL
  Filled 2020-11-30: qty 1

## 2020-11-30 NOTE — Group Note (Signed)
Recreation Therapy Group Note   Group Topic:Stress Management  Group Date: 11/30/2020 Start Time: 0930 End Time: 0950 Facilitators: Caroll Rancher, LRT/CTRS Location: [address] Dayroom   Goal Area(s) Addresses:  Patient will actively participate in stress management techniques presented during session.  Patient will successfully identify benefit of practicing stress management post d/c.    Group Description: Guided Imagery. LRT provided education, instruction, and demonstration on practice of visualization via guided imagery. Patient was asked to participate in the technique introduced during session.  Patients were given suggestions of ways to access scripts post d/c and encouraged to explore Youtube and other apps available on smartphones, tablets, and computers   Affect/Mood: Appropriate   Participation Level: Engaged   Participation Quality: Independent   Behavior: Appropriate   Speech/Thought Process: Focused   Insight: Good   Judgement: Good   Modes of Intervention: Soft Ocean Sounds; Script   Patient Response to Interventions:  Engaged   Education Outcome:  Acknowledges education and In group clarification offered    Clinical Observations/Individualized Feedback: Pt attended and participate in group.    Plan: Continue to engage patient in RT group sessions 2-3x/week.   Caroll Rancher, LRT/CTRS 11/30/2020 12:18 PM

## 2020-11-30 NOTE — BHH Counselor (Signed)
CSW spoke with Mrs. Morgran who admits that she does use Adderall regularly and does believe that she has a substance use issue with the medication.  She states that she would like to attend [facility] for outpatient substance use treatment.  CSW explained to the Pt that due to her up-coming court dates and probation the Pt may not be able to attend a residential treatment center.   The Pt stated that she understands this information and is willing to go to outpatient treatment instead.  CSW will provide the Pt with a list of residential treatment facilities that she can attend once her court dates are taken care of.

## 2020-11-30 NOTE — BH IP Treatment Plan (Signed)
Interdisciplinary Treatment and Diagnostic Plan Update  11/30/2020 Time of Session: 9:10am  [name] MRN: [id number]  Principal Diagnosis: Bipolar disorder, curr episode mixed, severe, w/o psychotic features ([place])  Secondary Diagnoses: Principal Problem:   Bipolar disorder, curr episode mixed, severe, w/o psychotic features ([place])   Current Medications:  Current Facility-Administered Medications  Medication Dose Route Frequency Provider Last Rate Last Admin   acetaminophen (TYLENOL) tablet 650 mg  650 mg Oral Q6H PRN Nelda Marseille, Amy E, MD       alum & mag hydroxide-simeth (MAALOX/MYLANTA) 200-200-20 MG/5ML suspension 15 mL  15 mL Oral Q6H PRN Nelda Marseille, Amy E, MD   15 mL at 11/29/20 1641   [START ON 12/01/2020] clonazePAM (KLONOPIN) tablet 1 mg  1 mg Oral QHS Doda, Vandana, MD       docusate sodium (COLACE) capsule 100 mg  100 mg Oral Daily PRN Armando Reichert, MD   100 mg at 11/29/20 1135   hydrOXYzine (ATARAX/VISTARIL) tablet 25 mg  25 mg Oral TID PRN Harlow Asa, MD   25 mg at 11/29/20 0813   magnesium hydroxide (MILK OF MAGNESIA) suspension 5 mL  5 mL Oral Daily PRN Viann Fish E, MD   5 mL at 11/28/20 1456   mirtazapine (REMERON) tablet 7.5 mg  7.5 mg Oral QHS Briant Cedar, MD   7.5 mg at 11/29/20 2058   nicotine (NICODERM CQ - dosed in mg/24 hours) patch 21 mg  21 mg Transdermal Daily Nelda Marseille, Amy E, MD   21 mg at 11/30/20 0810   polyethylene glycol (MIRALAX / GLYCOLAX) packet 17 g  17 g Oral Daily Nelda Marseille, Amy E, MD   17 g at 11/30/20 0938   QUEtiapine (SEROQUEL) tablet 150 mg  150 mg Oral QHS Armando Reichert, MD       sodium phosphate (FLEET) 7-19 GM/118ML enema 1 enema  1 enema Rectal Once Armando Reichert, MD       PTA Medications: Medications Prior to Admission  Medication Sig Dispense Refill Last Dose   ARIPiprazole (ABILIFY) 5 MG tablet Take 5 mg by mouth at bedtime.      clonazePAM (KLONOPIN) 0.5 MG tablet Take 1 mg by mouth 2 (two) times daily.       Levonorgestrel-Ethinyl Estradiol (DAYSEE) 0.15-0.03 &0.01 MG tablet Take 1 tablet by mouth daily. (Patient not taking: Reported on 11/27/2020) 91 tablet 2    naproxen (NAPROSYN) 375 MG tablet Take 1 tablet (375 mg total) by mouth 2 (two) times daily. (Patient not taking: Reported on 11/27/2020) 20 tablet 0    ondansetron (ZOFRAN) 4 MG tablet Take 1 tablet (4 mg total) by mouth every 8 (eight) hours as needed for nausea or vomiting. (Patient not taking: Reported on 11/27/2020) 10 tablet 0    traZODone (DESYREL) 100 MG tablet Take 1 tablet (100 mg total) by mouth at bedtime. For insomnia 30 tablet 0    VYVANSE 50 MG capsule Take 50 mg by mouth every morning.       Patient Stressors: Legal issue   Marital or family conflict   Substance abuse   Traumatic event    Patient Strengths: Capable of independent living  Electronics engineer  Supportive family/friends   Treatment Modalities: Medication Management, Group therapy, Case management,  1 to 1 session with clinician, Psychoeducation, Recreational therapy.   Physician Treatment Plan for Primary Diagnosis: Bipolar disorder, curr episode mixed, severe, w/o psychotic features ([place]) Long Term Goal(s): Improvement in symptoms so as ready for discharge  Short Term Goals: Ability to identify changes in lifestyle to reduce recurrence of condition will improve Ability to verbalize feelings will improve Ability to disclose and discuss suicidal ideas Ability to identify and develop effective coping behaviors will improve Ability to identify triggers associated with substance abuse/mental health issues will improve  Medication Management: Evaluate patient's response, side effects, and tolerance of medication regimen.  Therapeutic Interventions: 1 to 1 sessions, Unit Group sessions and Medication administration.  Evaluation of Outcomes: Not Met  Physician Treatment Plan for Secondary Diagnosis: Principal Problem:   Bipolar  disorder, curr episode mixed, severe, w/o psychotic features ([place])  Long Term Goal(s): Improvement in symptoms so as ready for discharge   Short Term Goals: Ability to identify changes in lifestyle to reduce recurrence of condition will improve Ability to verbalize feelings will improve Ability to disclose and discuss suicidal ideas Ability to identify and develop effective coping behaviors will improve Ability to identify triggers associated with substance abuse/mental health issues will improve     Medication Management: Evaluate patient's response, side effects, and tolerance of medication regimen.  Therapeutic Interventions: 1 to 1 sessions, Unit Group sessions and Medication administration.  Evaluation of Outcomes: Not Met   RN Treatment Plan for Primary Diagnosis: Bipolar disorder, curr episode mixed, severe, w/o psychotic features ([place]) Long Term Goal(s): Knowledge of disease and therapeutic regimen to maintain health will improve  Short Term Goals: Ability to remain free from injury will improve, Ability to demonstrate self-control, Ability to participate in decision making will improve, Ability to verbalize feelings will improve, Ability to disclose and discuss suicidal ideas, and Ability to identify and develop effective coping behaviors will improve  Medication Management: RN will administer medications as ordered by provider, will assess and evaluate patient's response and provide education to patient for prescribed medication. RN will report any adverse and/or side effects to prescribing provider.  Therapeutic Interventions: 1 on 1 counseling sessions, Psychoeducation, Medication administration, Evaluate responses to treatment, Monitor vital signs and CBGs as ordered, Perform/monitor CIWA, COWS, AIMS and Fall Risk screenings as ordered, Perform wound care treatments as ordered.  Evaluation of Outcomes: Not Met   LCSW Treatment Plan for Primary Diagnosis: Bipolar disorder,  curr episode mixed, severe, w/o psychotic features ([place]) Long Term Goal(s): Safe transition to appropriate next level of care at discharge, Engage patient in therapeutic group addressing interpersonal concerns.  Short Term Goals: Engage patient in aftercare planning with referrals and resources, Increase social support, Increase emotional regulation, Facilitate acceptance of mental health diagnosis and concerns, Identify triggers associated with mental health/substance abuse issues, and Increase skills for wellness and recovery  Therapeutic Interventions: Assess for all discharge needs, 1 to 1 time with Social worker, Explore available resources and support systems, Assess for adequacy in community support network, Educate family and significant other(s) on suicide prevention, Complete Psychosocial Assessment, Interpersonal group therapy.  Evaluation of Outcomes: Not Met   Progress in Treatment: Attending groups: Yes. Participating in groups: Yes. Taking medication as prescribed: Yes. Toleration medication: Yes. Family/Significant other contact made: Yes, individual(s) contacted:  Mother and father  Patient understands diagnosis: Yes. Discussing patient identified problems/goals with staff: Yes. Medical problems stabilized or resolved: Yes. Denies suicidal/homicidal ideation: Yes. Issues/concerns per patient self-inventory: No.   New problem(s) identified: No, Describe:  None   New Short Term/Long Term Goal(s): medication stabilization, elimination of SI thoughts, development of comprehensive mental wellness plan.   Patient Goals: "To get better, whatever it takes"   Discharge Plan or Barriers: Patient recently admitted.  CSW will continue to follow and assess for appropriate referrals and possible discharge planning.   Reason for Continuation of Hospitalization: Depression Medication stabilization Suicidal ideation Withdrawal symptoms  Estimated Length of Stay: 3 to 5 days     Scribe for Treatment Team: Darleen Crocker, Latanya Presser 11/30/2020 2:40 PM

## 2020-11-30 NOTE — BHH Group Notes (Signed)
Patient participated and contributed to goals group 

## 2020-11-30 NOTE — BHH Group Notes (Signed)
Pt did not attend NA meeting this evening. Pt was in bed sleeping.

## 2020-11-30 NOTE — BHH Counselor (Signed)
CSW provided the Pt with a form that she can complete to get transportation in [facility].  The Pt will complete this form and mail or fax it back to the [place] transit authority.  This transportation is designed for medical and any other transportation needs for [facility] residents who do not have Medicaid and are younger then 40 years old.
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
## 2020-11-30 NOTE — Progress Notes (Signed)
[facility] MD Progress Note  11/30/2020 2:49 PM [name]  MRN:  [id number]  In short :[name] is a 40 yr old female who presents with worsening depression and SI. 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  Patient has been taking all her scheduled medications.  And took as needed Vistaril, Colace, and Maalox yesterday.  Nursing notes indicate that Pt slept for 6.5 hours.  CSW reported that patient has multiple pending charges including felony, larceny, assault, and card theft.  Patient has multiple court date coming.  CSW provided patient with list of residential treatment facilities that she can attend once her court dates are taken care of.  Subjective:  Pt is found in the hallway smiling and talking to other patient.  Her affect was bright in the hallway.  Her affect completely changed when I met in her room.  Patient starts crying with depressed and tearful affect.  Her mood is labile.  Pt states her mood is still depressed but little better than yesterday.  She states mornings are hard for her and she feels more anxious in the morning time.  She slept well last night but had some vivid dreams. Pt states her appetite is good and she has been eating her meals. Pt rates her anxiety at 10/10 (10 is the worst anxiety).  She feels that her anxiety is better now with Xanax.  She is requesting Seroquel during daytime to help with her anxiety.  She states she called her mom but she did not pick up the phone.  She states she misses her mom and her daughter.  Currently, Pt endorses passive suicidal ideation without a plan or intent.  She contracts for safety on the unit.  She denies homicidal ideation and, visual and auditory hallucination.  She still reports constipation.  She states she had small bowel movement yesterday and the stool looked like pellets.  She wants something  stronger for constipation. Pt denies any headache, nausea, vomiting, dizziness, chest pain, SOB, abdominal pain, diarrhea..  Pt denies any medication side effects and has been tolerating it well. Pt denies any concerns.  CSW obtained collateral from her dad.  Per CSW notes 11/29/2020-Mr. Sauseda who states that his daughter was abusing Adderall and got into trouble with the law because of it.  He states that she then left her husband and children and did not tell them where she was going.  He states that he lives 200 miles away but that her mother lives a few houses away from her husband and children.  He states that his daughter has been in contact with her mother but not with her children.  Mr. Bogie states that his daughter has not been to any type of substance use treatment and does not tell people about her substance use. Mr. Minchew states that her mother would have more information.    Principal Problem: Bipolar disorder, curr episode mixed, severe, w/o psychotic features ([place]) Diagnosis: Principal Problem:   Bipolar disorder, curr episode mixed, severe, w/o psychotic features ([place])  Total Time spent with patient: 20 minutes  Past Psychiatric History: See H&P  Past Medical History:  Past Medical History:  Diagnosis Date   Abnormal Pap smear    ADHD (attention deficit hyperactivity disorder)    Anemia    generalized anxiety disorder   Anxiety    Asthma    Blood transfusion  Pt delivery in 8099   Complication of anesthesia    Headache(784.0)    PPH (postpartum hemorrhage)    Spinal headache     Past Surgical History:  Procedure Laterality Date   TONSILLECTOMY  2011   Family History:  Family History  Problem Relation Age of Onset   Thrombophlebitis Mother    Hypertension Father    Heart disease Maternal Grandmother    Heart disease Paternal Grandfather    Hypertension Paternal Grandfather    Family Psychiatric  History: See H&P Social History:  Social History    Substance and Sexual Activity  Alcohol Use No     Social History   Substance and Sexual Activity  Drug Use Yes   Types: Marijuana, Amphetamines   Comment: 07/18/15  "smoked off and on since age 4"    Social History   Socioeconomic History   Marital status: Legally Separated    Spouse name: Not on file   Number of children: 2   Years of education: 18   Highest education level: Not on file  Occupational History    Comment: home maker  Tobacco Use   Smoking status: Every Day    Packs/day: 1.00    Types: Cigarettes   Smokeless tobacco: Never  Vaping Use   Vaping Use: Never used  Substance and Sexual Activity   Alcohol use: No   Drug use: Yes    Types: Marijuana, Amphetamines    Comment: 07/18/15  "smoked off and on since age 79"   Sexual activity: Not Currently    Comment: Pt currently pregnant  Other Topics Concern   Not on file  Social History Narrative   Lives with mother and children    caffeine use- coffee 1 cup in morning   Social Determinants of Health   Financial Resource Strain: Not on file  Food Insecurity: Not on file  Transportation Needs: Not on file  Physical Activity: Not on file  Stress: Not on file  Social Connections: Not on file   Additional Social History:                         Sleep: Good  Appetite:  Good  Current Medications: Current Facility-Administered Medications  Medication Dose Route Frequency Provider Last Rate Last Admin   acetaminophen (TYLENOL) tablet 650 mg  650 mg Oral Q6H PRN Nelda Marseille, Amy E, MD       alum & mag hydroxide-simeth (MAALOX/MYLANTA) 200-200-20 MG/5ML suspension 15 mL  15 mL Oral Q6H PRN Nelda Marseille, Amy E, MD   15 mL at 11/29/20 1641   [START ON 12/01/2020] clonazePAM (KLONOPIN) tablet 1 mg  1 mg Oral QHS Verley Pariseau, MD       docusate sodium (COLACE) capsule 100 mg  100 mg Oral Daily PRN Armando Reichert, MD   100 mg at 11/29/20 1135   hydrOXYzine (ATARAX/VISTARIL) tablet 25 mg  25 mg Oral TID PRN  Harlow Asa, MD   25 mg at 11/29/20 0813   magnesium hydroxide (MILK OF MAGNESIA) suspension 5 mL  5 mL Oral Daily PRN Viann Fish E, MD   5 mL at 11/28/20 1456   mirtazapine (REMERON) tablet 7.5 mg  7.5 mg Oral QHS Briant Cedar, MD   7.5 mg at 11/29/20 2058   nicotine (NICODERM CQ - dosed in mg/24 hours) patch 21 mg  21 mg Transdermal Daily Nelda Marseille, Amy E, MD   21 mg at 11/30/20 0810   polyethylene glycol (  MIRALAX / GLYCOLAX) packet 17 g  17 g Oral Daily Nelda Marseille, Amy E, MD   17 g at 11/30/20 9629   QUEtiapine (SEROQUEL) tablet 150 mg  150 mg Oral QHS Armando Reichert, MD       sodium phosphate (FLEET) 7-19 GM/118ML enema 1 enema  1 enema Rectal Once Armando Reichert, MD        Lab Results:  Results for orders placed or performed during the hospital encounter of 11/28/20 (from the past 48 hour(s))  Lipid panel     Status: Abnormal   Collection Time: 11/29/20  6:20 AM  Result Value Ref Range   Cholesterol 159 0 - 200 mg/dL   Triglycerides 89 <150 mg/dL   HDL 39 (L) >40 mg/dL   Total CHOL/HDL Ratio 4.1 RATIO   VLDL 18 0 - 40 mg/dL   LDL Cholesterol 102 (H) 0 - 99 mg/dL    Comment:        Total Cholesterol/HDL:CHD Risk Coronary Heart Disease Risk Table                     Men   Women  1/2 Average Risk   3.4   3.3  Average Risk       5.0   4.4  2 X Average Risk   9.6   7.1  3 X Average Risk  23.4   11.0        Use the calculated Patient Ratio above and the CHD Risk Table to determine the patient's CHD Risk.        ATP III CLASSIFICATION (LDL):  <100     mg/dL   Optimal  100-129  mg/dL   Near or Above                    Optimal  130-159  mg/dL   Borderline  160-189  mg/dL   High  >190     mg/dL   Very High Performed at [facility] [address], [place], [place] [address]   Hemoglobin A1c     Status: None   Collection Time: 11/29/20  6:20 AM  Result Value Ref Range   Hgb A1c MFr Bld 5.3 4.8 - 5.6 %    Comment: (NOTE) Pre diabetes:           5.7%-6.4%  Diabetes:              >6.4%  Glycemic control for   <7.0% adults with diabetes    Mean Plasma Glucose 105.41 mg/dL    Comment: Performed at [facility] [address], [place], [place] [address]  TSH     Status: None   Collection Time: 11/29/20  6:20 AM  Result Value Ref Range   TSH 1.326 0.350 - 4.500 uIU/mL    Comment: Performed by a 3rd Generation assay with a functional sensitivity of <=0.01 uIU/mL. Performed at [facility], [place] [name], [place],  [address]     Blood Alcohol level:  Lab Results  Component Value Date   [facility] <10 11/27/2020   ETH <5 53/66/4403    Metabolic Disorder Labs: Lab Results  Component Value Date   HGBA1C 5.3 11/29/2020   MPG 105.41 11/29/2020   MPG 117 05/27/2014   No results found for: PROLACTIN Lab Results  Component Value Date   CHOL 159 11/29/2020   TRIG 89 11/29/2020   HDL 39 (L) 11/29/2020   CHOLHDL 4.1 11/29/2020  VLDL 18 11/29/2020   LDLCALC 102 (H) 11/29/2020   LDLCALC 88 05/27/2014    Physical Findings: AIMS: Facial and Oral Movements Muscles of Facial Expression: None, normal Lips and Perioral Area: None, normal Jaw: None, normal Tongue: None, normal,Extremity Movements Upper (arms, wrists, hands, fingers): None, normal Lower (legs, knees, ankles, toes): None, normal, Trunk Movements Neck, shoulders, hips: None, normal, Overall Severity Severity of abnormal movements (highest score from questions above): None, normal Incapacitation due to abnormal movements: None, normal Patient's awareness of abnormal movements (rate only patient's report): No Awareness, Dental Status Current problems with teeth and/or dentures?: No Does patient usually wear dentures?: No  CIWA:    COWS:     Musculoskeletal: Strength & Muscle Tone: within normal limits Gait & Station: normal Patient leans: N/A  Psychiatric Specialty Exam:  Presentation  General Appearance: Appropriate for  Environment  Eye Contact:Fair  Speech:Clear and Coherent  Speech Volume:Normal  Handedness:No data recorded  Mood and Affect  Mood:Anxious; Depressed  Affect:Constricted; Tearful   Thought Process  Thought Processes:Goal Directed  Descriptions of Associations:Tangential  Orientation:Full (Time, Place and Person)  Thought Content:Rumination  History of Schizophrenia/Schizoaffective disorder:No  Duration of Psychotic Symptoms:No data recorded Hallucinations:Hallucinations: None  Ideas of Reference:None  Suicidal Thoughts:Suicidal Thoughts: Yes, Passive SI Passive Intent and/or Plan: Without Intent; Without Plan; Without Means to Carry Out  Homicidal Thoughts:Homicidal Thoughts: No   Sensorium  Memory:Immediate Fair; Recent Fair  Judgment:Poor  Insight:Poor   Executive Functions  Concentration:Fair  Attention Span:Fair  [place]   Psychomotor Activity  Psychomotor Activity:Psychomotor Activity: Increased   Assets  Assets:Communication Skills; Desire for Improvement; Physical Health   Sleep  Sleep:Sleep: Good Number of Hours of Sleep: 6.5    Physical Exam: Physical Exam Vitals and nursing note reviewed.  Constitutional:      Appearance: Normal appearance. She is not ill-appearing, toxic-appearing or diaphoretic.  HENT:     Head: Normocephalic and atraumatic.  Pulmonary:     Effort: Pulmonary effort is normal.  Neurological:     General: No focal deficit present.     Mental Status: She is alert and oriented to person, place, and time.   Review of Systems  Constitutional:  Negative for chills and fever.  HENT:  Negative for hearing loss.   Eyes:  Negative for blurred vision.  Respiratory:  Negative for cough and shortness of breath.   Cardiovascular:  Negative for chest pain.  Gastrointestinal:  Positive for constipation. Negative for abdominal pain, nausea and vomiting.  Neurological:   Negative for dizziness, weakness and headaches.  Psychiatric/Behavioral:  Positive for depression and suicidal ideas. Negative for hallucinations. The patient is nervous/anxious. The patient does not have insomnia.   Blood pressure (!) 115/99, pulse 91, temperature 97.9 F (36.6 C), temperature source Oral, resp. rate 18, height '5\' 5"'  (1.651 m), weight 64.4 kg, last menstrual period 11/20/2020, SpO2 100 %. Body mass index is 23.63 kg/m.   Treatment Plan Summary: Daily contact with patient to assess and evaluate symptoms and progress in treatment Mrs. Sykora is a 40 yr old female who presents with worsening depression and SI. 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   Bipolar Disorder, mixed Episode (r/out with psychotic features): -Increase Seroquel to 150 mg qhs -will titrate Seroquel over coming days -EKG QTC 454, lipid profile shows cholesterol 159, LDL 102, triglycerides 89, HDL 39 and A1c  5.3  -Continue Remeron 7.5 mg at bedtime for depression. -Add Seroquel 25 mg during daytime for anxiety. -Patient will be referred to ACTT services after discharge.   GAD by hx:  -Decrease Klonopin to 1 mg nightly. verified dosing in PDMP at the time of admission .   ADHD by hx:  - Holding Vyvanse given concern for mixed manic episode - medication verified in PDMP    Cannabis use - episodic (r/o cannabis use d/o) - Counseled on need to abstain from substance use - UDS positive for THC   Constipation: -Continue Miralax daily -Continue Colace 100 mg as needed as needed for constipation. -Order Fleet enema once   -Continue PRN's: Tylenol, Maalox, Atarax, Milk of Magnesia, Trazodone  Armando Reichert, MD 11/30/2020, 2:49 PM

## 2020-11-30 NOTE — Group Note (Signed)
[facility] LCSW Group Therapy Note   Group Date: 11/30/2020 Start Time: 1300 End Time: 1345   Type of Therapy/Topic:  Group Therapy:  Emotion Regulation  Participation Level:  Active   Description of Group:    The purpose of this group is to assist patients in learning to regulate negative emotions and experience positive emotions. Patients will be guided to discuss ways in which they have been vulnerable to their negative emotions. These vulnerabilities will be juxtaposed with experiences of positive emotions or situations, and patients challenged to use positive emotions to combat negative ones. Special emphasis will be placed on coping with negative emotions in conflict situations, and patients will process healthy conflict resolution skills.  Therapeutic Goals: Patient will identify two positive emotions or experiences to reflect on in order to balance out negative emotions:  Patient will label two or more emotions that they find the most difficult to experience:  Patient will be able to demonstrate positive conflict resolution skills through discussion or role plays:   Summary of Patient Progress:  Pt came to group and introduced herself and stated that her current emotion she is feeling is nervous. Pt was appropriate and participated during discussion.   Therapeutic Modalities:   Cognitive Behavioral Therapy Feelings Identification Dialectical Behavioral Therapy   Felizardo Hoffmann, LCSWA

## 2020-11-30 NOTE — Progress Notes (Signed)
Pt stated she was concerned about her situation with her husband, not knowing what was really going on.     11/30/20 0000  Psych Admission Type (Psych Patients Only)  Admission Status Voluntary  Psychosocial Assessment  Patient Complaints Anxiety;Worrying;Sadness  Eye Contact Fair  Facial Expression Flat;Anxious;Animated  Affect Appropriate to circumstance  Furniture conservator/restorer;Restless  Appearance/Hygiene Unremarkable  Behavior Characteristics Cooperative  Mood Depressed  Thought Process  Coherency Circumstantial  Content Blaming self  Delusions None reported or observed  Perception WDL  Hallucination None reported or observed  Judgment Impaired  Confusion None  Danger to Self  Current suicidal ideation? Passive  Self-Injurious Behavior No self-injurious ideation or behavior indicators observed or expressed   Agreement Not to Harm Self Yes  Description of Agreement verbal  Danger to Others  Danger to Others None reported or observed

## 2020-12-01 NOTE — Progress Notes (Addendum)
[name] Progress Note  12/01/2020 2:13 PM [name]  MRN:  [id number]  In short :[name] is a 40 yr old female who presents with worsening depression and SI. 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  Patient has been taking all her scheduled medications.  She did not need any as needed medications yesterday.  Nursing notes indicate that Pt slept for 5.5 hours.  Subjective:  Pt is found lying on the bed in her room.  Pt states her mood is "not great". She continues to report depression and anxiety. Pt rates her depression at 7-8/10 on a scale of 1-10  (10 being worst symptoms ). She slept well last night. Pt states her appetite is good and she has been eating her meals.  She reports anxiety increased anxiety in the morning.  She states that she called her dad and her dad spoke to her very rudely.  She is upset about that. Currently, Pt endorses passive suicidal ideation without a plan or intent. she contracts for safety on the unit.  She states she is interested in inpatient rehab but it may not be possible due to her upcoming court dates.  She states that she knows that she needs help with substance abuse problem and if she is not able to do inpatient rehab then she can do SA IOP.  I told her that the social worker can talk to her about SAIOP.  Patient verbalized understanding.  She denies homicidal ideation and, visual and auditory hallucination.   Pt denies any headache, nausea, vomiting, dizziness, chest pain, SOB, abdominal pain, diarrhea and constipation. Pt denies any medication side effects and has been tolerating it well.  Patient states she will follow-up with ACTT team. I told her that she can follow-up with any outpatient psychiatrist but patient is fixated on following up with ACTT team. Pt denies any other concerns.  CSW obtained collateral from her mom  -Per CSW, her mom [name] confirms her daughters drug abuse problem and states patient is taking Vyvanse and Adderall to the "point of psychosis".  She stated that on at least 1 occasion her daughter's children were taken by DSS for 1 year due to her substance use.  She stated that her daughter went to jail for domestic violence against her husband and her.  She was seeing Dr Evelene Croon who refused to give her Adderall anymore.  She then went to an [facility] ACTT team.  Mom believes that she chose this agency just to get more Adderall.  principal Problem: Bipolar disorder, curr episode mixed, severe, w/o psychotic features (HCC) Diagnosis: Principal Problem:   Bipolar disorder, curr episode mixed, severe, w/o psychotic features (HCC)  Total Time spent with patient: 20 minutes  Past Psychiatric History: See H&P  Past Medical History:  Past Medical History:  Diagnosis Date   Abnormal Pap smear    ADHD (attention deficit hyperactivity disorder)    Anemia    generalized anxiety disorder   Anxiety    Asthma    Blood transfusion    Pt delivery in 2009   Complication of anesthesia    Headache(784.0)    PPH (postpartum hemorrhage)    Spinal headache     Past Surgical History:  Procedure Laterality Date   TONSILLECTOMY  2011   Family History:  Family History  Problem Relation Age of Onset   Thrombophlebitis Mother  Hypertension Father    Heart disease Maternal Grandmother    Heart disease Paternal Grandfather    Hypertension Paternal Grandfather    Family Psychiatric  History: See H&P Social History:  Social History   Substance and Sexual Activity  Alcohol Use No     Social History   Substance and Sexual Activity  Drug Use Yes   Types: Marijuana, Amphetamines   Comment: 07/18/15  "smoked off and on since age 40"    Social History   Socioeconomic History   Marital status: Legally Separated    Spouse name: Not on file   Number of children: 2   Years of  education: 18   Highest education level: Not on file  Occupational History    Comment: home maker  Tobacco Use   Smoking status: Every Day    Packs/day: 1.00    Types: Cigarettes   Smokeless tobacco: Never  Vaping Use   Vaping Use: Never used  Substance and Sexual Activity   Alcohol use: No   Drug use: Yes    Types: Marijuana, Amphetamines    Comment: 07/18/15  "smoked off and on since age 36"   Sexual activity: Not Currently    Comment: Pt currently pregnant  Other Topics Concern   Not on file  Social History Narrative   Lives with mother and children    caffeine use- coffee 1 cup in morning   Social Determinants of Health   Financial Resource Strain: Not on file  Food Insecurity: Not on file  Transportation Needs: Not on file  Physical Activity: Not on file  Stress: Not on file  Social Connections: Not on file   Additional Social History:                         Sleep: Good  Appetite:  Good  Current Medications: Current Facility-Administered Medications  Medication Dose Route Frequency Provider Last Rate Last Admin   acetaminophen (TYLENOL) tablet 650 mg  650 mg Oral Q6H PRN Mason Jim, Amy E, Brown       alum & mag hydroxide-simeth (MAALOX/MYLANTA) 200-200-20 MG/5ML suspension 15 mL  15 mL Oral Q6H PRN Mason Jim, Amy E, Brown   15 mL at 11/29/20 1641   clonazePAM (KLONOPIN) tablet 1 mg  1 mg Oral QHS Mylia Pondexter, Brown       docusate sodium (COLACE) capsule 100 mg  100 mg Oral Daily PRN Karsten Ro, Brown   100 mg at 11/29/20 1135   hydrOXYzine (ATARAX/VISTARIL) tablet 25 mg  25 mg Oral TID PRN Comer Locket, Brown   25 mg at 12/01/20 0805   magnesium hydroxide (MILK OF MAGNESIA) suspension 5 mL  5 mL Oral Daily PRN Bartholomew Crews E, Brown   5 mL at 11/28/20 1456   mirtazapine (REMERON) tablet 7.5 mg  7.5 mg Oral QHS Lauro Franklin, Brown   7.5 mg at 11/30/20 2139   nicotine (NICODERM CQ - dosed in mg/24 hours) patch 21 mg  21 mg Transdermal Daily Mason Jim, Amy  E, Brown   21 mg at 12/01/20 0806   polyethylene glycol (MIRALAX / GLYCOLAX) packet 17 g  17 g Oral Daily Mason Jim, Amy E, Brown   17 g at 12/01/20 0806   QUEtiapine (SEROQUEL) tablet 150 mg  150 mg Oral QHS Karsten Ro, Brown   150 mg at 11/30/20 2139   QUEtiapine (SEROQUEL) tablet 25 mg  25 mg Oral Daily Karsten Ro, Brown   25 mg  at 12/01/20 0804    Lab Results:  No results found for this or any previous visit (from the past 48 hour(s)).   Blood Alcohol level:  Lab Results  Component Value Date   ETH <10 11/27/2020   ETH <5 07/10/2014    Metabolic Disorder Labs: Lab Results  Component Value Date   HGBA1C 5.3 11/29/2020   MPG 105.41 11/29/2020   MPG 117 05/27/2014   No results found for: PROLACTIN Lab Results  Component Value Date   CHOL 159 11/29/2020   TRIG 89 11/29/2020   HDL 39 (L) 11/29/2020   CHOLHDL 4.1 11/29/2020   VLDL 18 11/29/2020   LDLCALC 102 (H) 11/29/2020   LDLCALC 88 05/27/2014    Physical Findings: AIMS: Facial and Oral Movements Muscles of Facial Expression: None, normal Lips and Perioral Area: None, normal Jaw: None, normal Tongue: None, normal,Extremity Movements Upper (arms, wrists, hands, fingers): None, normal Lower (legs, knees, ankles, toes): None, normal, Trunk Movements Neck, shoulders, hips: None, normal, Overall Severity Severity of abnormal movements (highest score from questions above): None, normal Incapacitation due to abnormal movements: None, normal Patient's awareness of abnormal movements (rate only patient's report): No Awareness, Dental Status Current problems with teeth and/or dentures?: No Does patient usually wear dentures?: No  CIWA:    COWS:     Musculoskeletal: Strength & Muscle Tone: within normal limits Gait & Station: normal Patient leans: N/A  Psychiatric Specialty Exam:  Presentation  General Appearance: Appropriate for Environment; Fairly Groomed  Eye Contact:Fair  Speech:Clear and Coherent  Speech  Volume:Normal  Handedness:Right  Mood and Affect  Mood:Anxious; Depressed  Affect:Constricted   Thought Process  Thought Processes:Goal Directed  Descriptions of Associations:Tangential  Orientation:Full (Time, Place and Person)  Thought Content:Rumination  History of Schizophrenia/Schizoaffective disorder:No  Duration of Psychotic Symptoms:No data recorded Hallucinations:Hallucinations: None  Ideas of Reference:None  Suicidal Thoughts:Suicidal Thoughts: Yes, Passive SI Passive Intent and/or Plan: Without Intent; Without Plan  Homicidal Thoughts:Homicidal Thoughts: No   Sensorium  Memory:Immediate Fair; Recent Fair  Judgment:Poor  Insight:Poor   Executive Functions  Concentration:Fair  Attention Span:Fair  Recall:Fair  Fund of Knowledge:Fair  Language:Fair   Psychomotor Activity  Psychomotor Activity:Psychomotor Activity: Normal   Assets  Assets:Communication Skills; Desire for Improvement; Physical Health   Sleep  Sleep:Sleep: Good Number of Hours of Sleep: 5.5    Physical Exam: Physical Exam Vitals and nursing note reviewed.  Constitutional:      Appearance: Normal appearance. She is not ill-appearing, toxic-appearing or diaphoretic.  HENT:     Head: Normocephalic and atraumatic.  Pulmonary:     Effort: Pulmonary effort is normal.  Neurological:     General: No focal deficit present.     Mental Status: She is alert and oriented to person, place, and time.   Review of Systems  Constitutional:  Negative for chills and fever.  HENT:  Negative for hearing loss.   Eyes:  Negative for blurred vision.  Respiratory:  Negative for cough and shortness of breath.   Cardiovascular:  Negative for chest pain.  Gastrointestinal:  Negative for abdominal pain, constipation, nausea and vomiting.  Neurological:  Negative for dizziness, weakness and headaches.  Psychiatric/Behavioral:  Positive for depression and suicidal ideas. Negative for  hallucinations. The patient is nervous/anxious. The patient does not have insomnia.   Blood pressure 102/76, pulse 91, temperature 97.6 F (36.4 C), temperature source Oral, resp. rate 16, height 5\' 5"  (1.651 m), weight 64.4 kg, last menstrual period 11/20/2020, SpO2 100 %. Body mass  index is 23.63 kg/m.   Treatment Plan Summary: Daily contact with patient to assess and evaluate symptoms and progress in treatment Mrs. Hyder is a 40 yr old female who presents with worsening depression and SI. 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 Patient continues to report depression, anxiety and passive suicidal ideation.  Will not make any medication changes today.  Continue to monitor her signs and symptoms.   Bipolar Disorder, mixed Episode (r/out with psychotic features): -Continue Seroquel 150 mg qhs. -EKG QTC 454, lipid profile shows cholesterol 159, LDL 102, triglycerides 89, HDL 39 and A1c 5.3  -Continue Remeron 7.5 mg at bedtime for depression. -Continue Seroquel 25 mg daily for daytime anxiety. -Patient will be referred to ACTT services after discharge.   GAD by hx:  -Continue Klonopin 1 mg nightly.  Tapering during this admission.   ADHD by hx:  - Holding Vyvanse given concern for mixed manic episode - medication verified in PDMP    Cannabis use - episodic (r/o cannabis use d/o) - Counseled on need to abstain from substance use - UDS positive for THC   Constipation:  -Continue Miralax daily -Continue Colace 100 mg as needed as needed for constipation.   -Continue PRN's: Tylenol, Maalox, Atarax, Milk of Magnesia  Karsten Ro, Brown PGY2 12/01/2020, 2:13 PM

## 2020-12-01 NOTE — Group Note (Unsigned)
Group Topic: Goal Setting  Group Date: 12/01/2020 Start Time: 0900 End Time: 1000 Facilitators: Guss Bunde  [facility]  Number of Participants: 10  Group Focus: other Treatment Modality:   Interventions utilized were  Purpose:    Name: [name] Date of Birth: [birth year]  MR: [id number]    Level of Participation: {THERAPIES; PSYCH GROUP PARTICIPATION RWERX:54008} Quality of Participation: {THERAPIES; PSYCH QUALITY OF PARTICIPATION:23992} Interactions with others: {THERAPIES; PSYCH INTERACTIONS:23993} Mood/Affect: {THERAPIES; PSYCH MOOD/AFFECT:23994} Triggers (if applicable): *** Cognition: {THERAPIES; PSYCH COGNITION:23995} Progress: {THERAPIES; PSYCH PROGRESS:23997} Response: *** Plan: {THERAPIES; PSYCH QPYP:95093}  Patients Problems:  Patient Active Problem List   Diagnosis Date Noted   MDD (major depressive disorder), severe (HCC) 11/28/2020   Dental caries 07/21/2018   Lower abdominal pain 09/10/2017   Bacterial vaginosis 07/02/2016   Health care maintenance 07/02/2016   Contraception management 04/05/2016   Asymmetrical sensorineural hearing loss 01/25/2016   Frequent No-show for appointment 01/16/2016   Conductive hearing loss of left ear with unrestricted hearing of right ear 07/05/2015   Chronic otitis media of left ear 05/09/2015   Perforated left tympanic membrane on examination 05/09/2015   Bruising 05/06/2015   De Quervain's tenosynovitis 04/15/2015   Right knee pain 04/15/2015   History of perforated ear drum 04/15/2015   Asthma with acute exacerbation 09/14/2014   Skin lesion of breast 09/14/2014   Thyroid nodule 09/14/2014   Rectal bleeding 09/14/2014   Bipolar disorder, curr episode mixed, severe, w/o psychotic features (HCC) 07/11/2014   Cannabis use disorder, moderate, dependence (HCC) 07/11/2014   Overweight (BMI 25.0-29.9) 10/12/2013   Menstrual cramp 03/24/2012   Asthma 06/28/2010   Mild  intermittent asthma without complication 06/28/2010   ADHD 06/13/2007   ADHD 06/13/2007

## 2020-12-01 NOTE — Progress Notes (Signed)
   12/01/20 0000  Psych Admission Type (Psych Patients Only)  Admission Status Voluntary  Psychosocial Assessment  Patient Complaints Anxiety;Worrying  Eye Contact Fair  Facial Expression Flat;Anxious;Animated  Affect Appropriate to circumstance  Furniture conservator/restorer;Restless  Appearance/Hygiene Unremarkable  Behavior Characteristics Cooperative  Mood Depressed  Thought Process  Coherency Circumstantial  Content Blaming self  Delusions None reported or observed  Perception WDL  Hallucination None reported or observed  Judgment Impaired  Confusion None  Danger to Self  Current suicidal ideation? Passive  Self-Injurious Behavior No self-injurious ideation or behavior indicators observed or expressed   Agreement Not to Harm Self Yes  Description of Agreement verbal  Danger to Others  Danger to Others None reported or observed

## 2020-12-01 NOTE — Progress Notes (Signed)
Patient did not attend wrap up group. 

## 2020-12-01 NOTE — Progress Notes (Signed)
DAR NOTE: Patient presents with anxious affect and mood.  Reports passive SI but denies audiovisual hallucinations.  Patient was tearful this morning during assessment and medication administration.  Offered support and encouragement as needed.  Requested and received Vistaril 25 mg x 2 for complain of anxiety with good effect.  Described energy level as low with poor concentration.  Observed pacing the hallway few times with her roommate.  Rates depression at 10, hopelessness at 8, and anxiety at 10.  Maintained on routine safety checks.  Medications given as prescribed.  Attended group and participated.  States goal for today is "to survive today."  Patient is safe on and off the unit.

## 2020-12-02 MED ORDER — CLONAZEPAM 0.5 MG PO TABS
0.5000 mg | ORAL_TABLET | Freq: Every day | ORAL | Status: DC
  Administered 2020-12-02: 0.5 mg via ORAL
  Filled 2020-12-02: qty 1

## 2020-12-02 MED ORDER — MIRTAZAPINE 7.5 MG PO TABS
7.5000 mg | ORAL_TABLET | Freq: Every day | ORAL | Status: DC
  Administered 2020-12-02 – 2020-12-05 (×4): 7.5 mg via ORAL
  Filled 2020-12-02 (×5): qty 1

## 2020-12-02 MED ORDER — CLONAZEPAM 1 MG PO TABS: 1.0000 mg | ORAL_TABLET | Freq: Every day | ORAL | Status: DC

## 2020-12-02 MED ORDER — QUETIAPINE FUMARATE 50 MG PO TABS
50.0000 mg | ORAL_TABLET | Freq: Every day | ORAL | Status: DC
Start: 2020-12-03 — End: 2020-12-08
  Administered 2020-12-03 – 2020-12-08 (×6): 50 mg via ORAL
  Filled 2020-12-02 (×8): qty 1

## 2020-12-02 MED ORDER — QUETIAPINE FUMARATE 50 MG PO TABS
150.0000 mg | ORAL_TABLET | Freq: Every day | ORAL | Status: DC
  Administered 2020-12-02 – 2020-12-05 (×4): 150 mg via ORAL
  Filled 2020-12-02 (×5): qty 3

## 2020-12-02 NOTE — Progress Notes (Signed)
[facility] MD Progress Note  12/02/2020 8:20 AM [name]  MRN:  [id number]  In short :[name] is a 40 yr old female who presents with worsening depression and SI. 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  Patient has been taking all her scheduled medications.  She needed as needed vistaril and Milk of Mg yesterday.  Nursing notes indicate that Pt slept for 6.75 hours.  Subjective:  Pt states her mood is still depressed. She continues to report depression and anxiety. Pt rates her depression at 8/10 and anxiety at 8-9/10 on a scale of 1-10  (10 being worst symptoms ). She slept well last night. Pt states her appetite is good and she ate her breakfast this morning.  She states that Seroquel helps with her anxiety and suicidal ideations. She states that she called her dad today and had better conversation than yesterday.   Currently, Pt endorses passive suicidal ideation without a plan or intent. She contracts for safety on the unit.  She states her suicidal ideation comes and goes and gets better with Seroquel.  She states she is interested in going to [facility] for substance abuse treatment.  Patient is ruminating on the fact that her husband also takes Adderall and  calls him hypocrite as he has a restraining order against her due to her substance abuse problem.  She feels that she needs a family therapy so that she can resolve her issues with her husband. She states she misses her kids and her car.  She denies homicidal ideation and, visual and auditory hallucination.  Patient still complains of constipation states she had 1 small bowel movement after enema yesterday but still feels like backed up.  She asks for another enema today.  I asked her to take as needed Colace and MiraLAX.  Patient is also requesting to change her night medication to 8 PM.  Pt denies any  headache, nausea, vomiting, dizziness, chest pain, SOB, abdominal pain, diarrhea. Pt denies any medication side effects and has been tolerating it well.  Pt denies any other concerns.  principal Problem: Bipolar disorder, curr episode mixed, severe, w/o psychotic features (HCC) Diagnosis: Principal Problem:   Bipolar disorder, curr episode mixed, severe, w/o psychotic features (HCC)  Total Time spent with patient: 20 minutes  Past Psychiatric History: See H&P  Past Medical History:  Past Medical History:  Diagnosis Date   Abnormal Pap smear    ADHD (attention deficit hyperactivity disorder)    Anemia    generalized anxiety disorder   Anxiety    Asthma    Blood transfusion    Pt delivery in 2009   Complication of anesthesia    Headache(784.0)    PPH (postpartum hemorrhage)    Spinal headache     Past Surgical History:  Procedure Laterality Date   TONSILLECTOMY  2011   Family History:  Family History  Problem Relation Age of Onset   Thrombophlebitis Mother    Hypertension Father    Heart disease Maternal Grandmother    Heart disease Paternal Grandfather    Hypertension Paternal Grandfather    Family Psychiatric  History: See H&P Social History:  Social History   Substance and Sexual Activity  Alcohol Use No     Social History   Substance and Sexual Activity  Drug Use Yes   Types: Marijuana, Amphetamines   Comment: 07/18/15  "smoked off and  on since age 34"    Social History   Socioeconomic History   Marital status: Legally Separated    Spouse name: Not on file   Number of children: 2   Years of education: 63   Highest education level: Not on file  Occupational History    Comment: home maker  Tobacco Use   Smoking status: Every Day    Packs/day: 1.00    Types: Cigarettes   Smokeless tobacco: Never  Vaping Use   Vaping Use: Never used  Substance and Sexual Activity   Alcohol use: No   Drug use: Yes    Types: Marijuana, Amphetamines    Comment:  07/18/15  "smoked off and on since age 38"   Sexual activity: Not Currently    Comment: Pt currently pregnant  Other Topics Concern   Not on file  Social History Narrative   Lives with mother and children    caffeine use- coffee 1 cup in morning   Social Determinants of Health   Financial Resource Strain: Not on file  Food Insecurity: Not on file  Transportation Needs: Not on file  Physical Activity: Not on file  Stress: Not on file  Social Connections: Not on file   Additional Social History:                         Sleep: Good  Appetite:  Good  Current Medications: Current Facility-Administered Medications  Medication Dose Route Frequency Provider Last Rate Last Admin   acetaminophen (TYLENOL) tablet 650 mg  650 mg Oral Q6H PRN Mason Jim, Amy E, MD       alum & mag hydroxide-simeth (MAALOX/MYLANTA) 200-200-20 MG/5ML suspension 15 mL  15 mL Oral Q6H PRN Mason Jim, Amy E, MD   15 mL at 11/29/20 1641   clonazePAM (KLONOPIN) tablet 1 mg  1 mg Oral QHS Beya Tipps, MD   1 mg at 12/01/20 2107   docusate sodium (COLACE) capsule 100 mg  100 mg Oral Daily PRN Karsten Ro, MD   100 mg at 11/29/20 1135   hydrOXYzine (ATARAX/VISTARIL) tablet 25 mg  25 mg Oral TID PRN Comer Locket, MD   25 mg at 12/01/20 1527   magnesium hydroxide (MILK OF MAGNESIA) suspension 5 mL  5 mL Oral Daily PRN Bartholomew Crews E, MD   5 mL at 12/01/20 2129   mirtazapine (REMERON) tablet 7.5 mg  7.5 mg Oral QHS Lauro Franklin, MD   7.5 mg at 12/01/20 2107   nicotine (NICODERM CQ - dosed in mg/24 hours) patch 21 mg  21 mg Transdermal Daily Mason Jim, Amy E, MD   21 mg at 12/01/20 0806   polyethylene glycol (MIRALAX / GLYCOLAX) packet 17 g  17 g Oral Daily Mason Jim, Amy E, MD   17 g at 12/01/20 0806   QUEtiapine (SEROQUEL) tablet 150 mg  150 mg Oral QHS Karsten Ro, MD   150 mg at 12/01/20 2107   QUEtiapine (SEROQUEL) tablet 25 mg  25 mg Oral Daily Karsten Ro, MD   25 mg at 12/01/20 1610     Lab Results:  No results found for this or any previous visit (from the past 48 hour(s)).   Blood Alcohol level:  Lab Results  Component Value Date   [facility] <10 11/27/2020   ETH <5 07/10/2014    Metabolic Disorder Labs: Lab Results  Component Value Date   HGBA1C 5.3 11/29/2020   MPG 105.41 11/29/2020   MPG 117 05/27/2014  No results found for: PROLACTIN Lab Results  Component Value Date   CHOL 159 11/29/2020   TRIG 89 11/29/2020   HDL 39 (L) 11/29/2020   CHOLHDL 4.1 11/29/2020   VLDL 18 11/29/2020   LDLCALC 102 (H) 11/29/2020   LDLCALC 88 05/27/2014    Physical Findings: AIMS: Facial and Oral Movements Muscles of Facial Expression: None, normal Lips and Perioral Area: None, normal Jaw: None, normal Tongue: None, normal,Extremity Movements Upper (arms, wrists, hands, fingers): None, normal Lower (legs, knees, ankles, toes): None, normal, Trunk Movements Neck, shoulders, hips: None, normal, Overall Severity Severity of abnormal movements (highest score from questions above): None, normal Incapacitation due to abnormal movements: None, normal Patient's awareness of abnormal movements (rate only patient's report): No Awareness, Dental Status Current problems with teeth and/or dentures?: No Does patient usually wear dentures?: No  CIWA:    COWS:     Musculoskeletal: Strength & Muscle Tone: within normal limits Gait & Station: normal Patient leans: N/A  Psychiatric Specialty Exam:  Presentation  General Appearance: Appropriate for Environment; Fairly Groomed  Eye Contact:Fair  Speech:Clear and Coherent  Speech Volume:Normal  Handedness:Right  Mood and Affect  Mood:Anxious; Depressed  Affect:Constricted   Thought Process  Thought Processes:Goal Directed  Descriptions of Associations:Tangential  Orientation:Full (Time, Place and Person)  Thought Content:Rumination  History of Schizophrenia/Schizoaffective disorder:No  Duration of Psychotic  Symptoms:No data recorded Hallucinations:Hallucinations: None  Ideas of Reference:None  Suicidal Thoughts:Suicidal Thoughts: Yes, Passive SI Passive Intent and/or Plan: Without Intent; Without Plan  Homicidal Thoughts:Homicidal Thoughts: No   Sensorium  Memory:Immediate Fair; Recent Fair  Judgment:Poor  Insight:Poor   Executive Functions  Concentration:Fair  Attention Span:Fair  Recall:Fair  Fund of Knowledge:Fair  Language:Fair   Psychomotor Activity  Psychomotor Activity:Psychomotor Activity: Normal   Assets  Assets:Communication Skills; Desire for Improvement; Physical Health   Sleep  Sleep:Sleep: Good Number of Hours of Sleep: 5.5    Physical Exam: Physical Exam Vitals and nursing note reviewed.  Constitutional:      Appearance: Normal appearance. She is not ill-appearing, toxic-appearing or diaphoretic.  HENT:     Head: Normocephalic and atraumatic.  Pulmonary:     Effort: Pulmonary effort is normal.  Neurological:     General: No focal deficit present.     Mental Status: She is alert and oriented to person, place, and time.   Review of Systems  Constitutional:  Negative for chills and fever.  HENT:  Negative for hearing loss.   Eyes:  Negative for blurred vision.  Respiratory:  Negative for cough and shortness of breath.   Cardiovascular:  Negative for chest pain.  Gastrointestinal:  Negative for abdominal pain, constipation, nausea and vomiting.  Neurological:  Negative for dizziness, weakness and headaches.  Psychiatric/Behavioral:  Positive for depression and suicidal ideas. Negative for hallucinations. The patient is nervous/anxious. The patient does not have insomnia.   Blood pressure 90/69, pulse 92, temperature 97.7 F (36.5 C), temperature source Oral, resp. rate 16, height 5\' 5"  (1.651 m), weight 64.4 kg, last menstrual period 11/20/2020, SpO2 99 %. Body mass index is 23.63 kg/m.   Treatment Plan Summary: Daily contact with  patient to assess and evaluate symptoms and progress in treatment Mrs. Hult is a 40 yr old female who presents with worsening depression and SI. 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 Patient continues to report depression, anxiety and passive suicidal ideation.   Continue  to monitor her signs and symptoms.   Bipolar Disorder, mixed Episode (r/out with psychotic features): -Continue Seroquel 150 mg qhs. -EKG QTC 454, lipid profile shows cholesterol 159, LDL 102, triglycerides 89, HDL 39 and A1c 5.3  -Continue Remeron 7.5 mg at bedtime for depression. -Increase Seroquel to 50 mg daily for daytime anxiety. -Patient will be referred to ACTT services after discharge.  -CSW to help with SA IOP.  GAD by hx:  -Decrease Klonopin to 0.5 mg nightly. Tapering during this admission.   ADHD by hx:  - Holding Vyvanse given concern for mixed manic episode - medication verified in PDMP    Cannabis use - episodic (r/o cannabis use d/o) - Counseled on need to abstain from substance use - UDS positive for THC   Constipation:  -Continue Miralax daily -Continue Colace 100 mg as needed as needed for constipation.   -Continue PRN's: Tylenol, Maalox, Atarax, Milk of Magnesia, Colace  Karsten Ro, MD PGY2 12/02/2020, 8:20 AM

## 2020-12-02 NOTE — Group Note (Signed)
Recreation Therapy Group Note   Group Topic:Stress Management  Group Date: 12/02/2020 Start Time: 0930 End Time: 0950 Facilitators: Caroll Rancher, LRT/CTRS Location: [address] Dayroom   Goal Area(s) Addresses:  Patient will identify positive stress management techniques. Patient will identify benefits of using stress management post d/c.  Group Description:  Meditation.  LRT played a meditation that focused on forgiveness of self and how it can be hard to show grace to oneself after a bad decision or not accomplishing a goal.  Patients were to listen and follow along with the meditation as it played to engage in activity.  LRT also gave patients options on platforms to use other stress management techniques such as Youtube, Apps and the Internet.     Affect/Mood: Appropriate   Participation Level: Engaged   Participation Quality: Independent   Behavior: Appropriate   Speech/Thought Process: Focused   Insight: Good   Judgement: Good   Modes of Intervention: Meditation   Patient Response to Interventions:  Engaged   Education Outcome:  Acknowledges education and In group clarification offered    Clinical Observations/Individualized Feedback:  Pt attended and participated in group.    Plan: Continue to engage patient in RT group sessions 2-3x/week.   Caroll Rancher, LRT/CTRS 12/02/2020 11:46 AM

## 2020-12-02 NOTE — [facility] Group Notes (Signed)
[facility] Group Notes:  (Nursing/MHT/Case Management/Adjunct)  Date:  12/02/2020  Time:  3:53 PM  Type of Therapy:  Group Therapy  Participation Level:  Active  Participation Quality:  Appropriate  Affect:  Appropriate  Cognitive:  Appropriate  Insight:  Appropriate  Engagement in Group:  Engaged  Modes of Intervention:  Discussion  Summary of Progress/Problems:Patient identified  ways to use her coping skills to reduce the chances of her having a set back in her recovery.  Reymundo Poll 12/02/2020, 3:53 PM

## 2020-12-02 NOTE — Progress Notes (Signed)
Patient denies SI, HI and AVH.  Patient stated "I do have a reason to live, I want to be able to be with my family." Patient reports having a dependence on Adderall stating it makes her act like a different person.  Patient is complaining of constipation despite having multiple medications to assist in having a bowel movement.   Assess patient for safety, offer medications as prescribed, engage patient in 1:1 staff talks.   Continue to monitor as planned, patient able to contract for safety.

## 2020-12-02 NOTE — Group Note (Signed)
LCSW Group Therapy Note   Group Date: 12/02/2020 Start Time: 1300 End Time: 1400   Type of Therapy and Topic:  Group Therapy: Boundaries  Participation Level:  Active  Description of Group: This group will address the use of boundaries in their personal lives. Patients will explore why boundaries are important, the difference between healthy and unhealthy boundaries, and negative and postive outcomes of different boundaries and will look at how boundaries can be crossed.  Patients will be encouraged to identify current boundaries in their own lives and identify what kind of boundary is being set. Facilitators will guide patients in utilizing problem-solving interventions to address and correct types boundaries being used and to address when no boundary is being used. Understanding and applying boundaries will be explored and addressed for obtaining and maintaining a balanced life. Patients will be encouraged to explore ways to assertively make their boundaries and needs known to significant others in their lives, using other group members and facilitator for role play, support, and feedback.  Therapeutic Goals:  1.  Patient will identify areas in their life where setting clear boundaries could be  used to improve their life.  2.  Patient will identify signs/triggers that a boundary is not being respected. 3.  Patient will identify two ways to set boundaries in order to achieve balance in  their lives: 4.  Patient will demonstrate ability to communicate their needs and set boundaries  through discussion and/or role plays  Summary of Patient Progress:  [name] was present/active throughout the session and proved open to feedback from CSW and peers. Patient demonstrated good insight into the subject matter, was respectful of peers, and was present throughout the entire session.  Therapeutic Modalities:   Cognitive Behavioral Therapy Solution-Focused Therapy  Beatris Si,  LCSWA 12/02/2020  2:09 PM

## 2020-12-02 NOTE — Progress Notes (Signed)
Pt very emotional about her situation, pt stated she plans to start taking care of herself and not worry about doing things for others at her expense.     12/02/20 0000  Psych Admission Type (Psych Patients Only)  Admission Status Voluntary  Psychosocial Assessment  Patient Complaints Anxiety  Eye Contact Fair  Facial Expression Flat;Anxious;Animated  Affect Appropriate to circumstance  Furniture conservator/restorer;Restless  Appearance/Hygiene Unremarkable  Behavior Characteristics Cooperative  Mood Anxious  Thought Process  Coherency Circumstantial  Content Blaming self  Delusions None reported or observed  Perception WDL  Hallucination None reported or observed  Judgment Impaired  Confusion None  Danger to Self  Current suicidal ideation? Passive  Self-Injurious Behavior No self-injurious ideation or behavior indicators observed or expressed   Agreement Not to Harm Self Yes  Description of Agreement verbal  Danger to Others  Danger to Others None reported or observed

## 2020-12-03 MED ORDER — BISACODYL 10 MG RE SUPP
10.0000 mg | Freq: Once | RECTAL | Status: AC
Start: 2020-12-03 — End: 2020-12-03
  Administered 2020-12-03: 10 mg via RECTAL
  Filled 2020-12-03 (×2): qty 1

## 2020-12-03 MED ORDER — CLONAZEPAM 0.5 MG PO TABS
0.5000 mg | ORAL_TABLET | Freq: Every day | ORAL | Status: AC
  Administered 2020-12-03: 0.5 mg via ORAL
  Filled 2020-12-03: qty 1

## 2020-12-03 NOTE — Progress Notes (Addendum)
[facility] MD Progress Note  12/03/2020 3:36 PM [name]  MRN:  [id number] Subjective:  [name] is a 40 yr old female who presents with worsening depression and SI. 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  On assessment today patient reports that she is having lower abdominal pain 2/2 constipation.  Patient reports that she has not had a full BM in at least 1 week.  Patient reports that she is having very small BM 2/2 medication to help relieve her constipation, but she is sure that she now has an anal fissure as she has been wiping blood and is more sensitive when she wipes.  Patient reports that she is very concerned about her constipation and believes it may be secondary to Seroquel.  Patient reports that she has had some problems with constipation in the past and has required manual disimpaction at least 4 times in the ED.  Patient reports that she has had constipation associated with Seroquel in the past as well.  Patient reports that her thoughts of wanting to harm herself are "getting better" but she endorses that she is still having some intermittently.  Patient reports that she does not want to act on these thoughts and does not give description as to whether or not she has a plan.  Patient reports that she does "want to get better."  Patient reports that she has been thinking about her future at discharge and is interested in the [facility].  Patient denies HI and AVH.  Patient endorses that she slept "okay" has been eating very well but is considering decreasing food intake secondary to constipation. Principal Problem: Bipolar disorder, curr episode mixed, severe, w/o psychotic features (HCC) Diagnosis: Principal Problem:   Bipolar disorder, curr episode mixed, severe, w/o psychotic features (HCC)  Total Time spent with patient: 15 minutes  Past  Psychiatric History: See H&P  Past Medical History:  Past Medical History:  Diagnosis Date   Abnormal Pap smear    ADHD (attention deficit hyperactivity disorder)    Anemia    generalized anxiety disorder   Anxiety    Asthma    Blood transfusion    Pt delivery in 2009   Complication of anesthesia    Headache(784.0)    PPH (postpartum hemorrhage)    Spinal headache     Past Surgical History:  Procedure Laterality Date   TONSILLECTOMY  2011   Family History:  Family History  Problem Relation Age of Onset   Thrombophlebitis Mother    Hypertension Father    Heart disease Maternal Grandmother    Heart disease Paternal Grandfather    Hypertension Paternal Grandfather    Family Psychiatric  History: See H&P  social History:  Social History   Substance and Sexual Activity  Alcohol Use No     Social History   Substance and Sexual Activity  Drug Use Yes   Types: Marijuana, Amphetamines   Comment: 07/18/15  "smoked off and on since age 38"    Social History   Socioeconomic History   Marital status: Legally Separated    Spouse name: Not on file   Number of children: 2   Years of education: 18   Highest education level: Not on file  Occupational History    Comment: home maker  Tobacco Use   Smoking status: Every Day    Packs/day: 1.00    Types: Cigarettes  Smokeless tobacco: Never  Vaping Use   Vaping Use: Never used  Substance and Sexual Activity   Alcohol use: No   Drug use: Yes    Types: Marijuana, Amphetamines    Comment: 07/18/15  "smoked off and on since age 36"   Sexual activity: Not Currently    Comment: Pt currently pregnant  Other Topics Concern   Not on file  Social History Narrative   Lives with mother and children    caffeine use- coffee 1 cup in morning   Social Determinants of Health   Financial Resource Strain: Not on file  Food Insecurity: Not on file  Transportation Needs: Not on file  Physical Activity: Not on file  Stress: Not on  file  Social Connections: Not on file   Additional Social History:                         Sleep: Fair  Appetite:  Good  Current Medications: Current Facility-Administered Medications  Medication Dose Route Frequency Provider Last Rate Last Admin   acetaminophen (TYLENOL) tablet 650 mg  650 mg Oral Q6H PRN Mason Jim, Amy E, MD       alum & mag hydroxide-simeth (MAALOX/MYLANTA) 200-200-20 MG/5ML suspension 15 mL  15 mL Oral Q6H PRN Mason Jim, Amy E, MD   15 mL at 11/29/20 1641   clonazePAM (KLONOPIN) tablet 0.5 mg  0.5 mg Oral QHS Doda, Vandana, MD   0.5 mg at 12/02/20 2058   docusate sodium (COLACE) capsule 100 mg  100 mg Oral Daily PRN Karsten Ro, MD   100 mg at 11/29/20 1135   hydrOXYzine (ATARAX/VISTARIL) tablet 25 mg  25 mg Oral TID PRN Comer Locket, MD   25 mg at 12/01/20 1527   magnesium hydroxide (MILK OF MAGNESIA) suspension 5 mL  5 mL Oral Daily PRN Bartholomew Crews E, MD   5 mL at 12/03/20 1515   mirtazapine (REMERON) tablet 7.5 mg  7.5 mg Oral QHS Doda, Vandana, MD   7.5 mg at 12/02/20 2058   nicotine (NICODERM CQ - dosed in mg/24 hours) patch 21 mg  21 mg Transdermal Daily Mason Jim, Amy E, MD   21 mg at 12/03/20 0832   polyethylene glycol (MIRALAX / GLYCOLAX) packet 17 g  17 g Oral Daily Mason Jim, Amy E, MD   17 g at 12/03/20 6503   QUEtiapine (SEROQUEL) tablet 150 mg  150 mg Oral QHS Karsten Ro, MD   150 mg at 12/02/20 2058   QUEtiapine (SEROQUEL) tablet 50 mg  50 mg Oral Daily Karsten Ro, MD   50 mg at 12/03/20 5465    Lab Results: No results found for this or any previous visit (from the past 48 hour(s)).  Blood Alcohol level:  Lab Results  Component Value Date   ETH <10 11/27/2020   ETH <5 07/10/2014    Metabolic Disorder Labs: Lab Results  Component Value Date   HGBA1C 5.3 11/29/2020   MPG 105.41 11/29/2020   MPG 117 05/27/2014   No results found for: PROLACTIN Lab Results  Component Value Date   CHOL 159 11/29/2020   TRIG 89  11/29/2020   HDL 39 (L) 11/29/2020   CHOLHDL 4.1 11/29/2020   VLDL 18 11/29/2020   LDLCALC 102 (H) 11/29/2020   LDLCALC 88 05/27/2014    Physical Findings: AIMS: Facial and Oral Movements Muscles of Facial Expression: None, normal Lips and Perioral Area: None, normal Jaw: None, normal Tongue: None, normal,Extremity Movements Upper (  arms, wrists, hands, fingers): None, normal Lower (legs, knees, ankles, toes): None, normal, Trunk Movements Neck, shoulders, hips: None, normal, Overall Severity Severity of abnormal movements (highest score from questions above): None, normal Incapacitation due to abnormal movements: None, normal Patient's awareness of abnormal movements (rate only patient's report): No Awareness, Dental Status Current problems with teeth and/or dentures?: No Does patient usually wear dentures?: No  CIWA:    COWS:     Musculoskeletal: Strength & Muscle Tone: within normal limits Gait & Station: normal Patient leans: N/A  Psychiatric Specialty Exam:  Presentation  General Appearance: Appropriate for Environment  Eye Contact:Good  Speech:Clear and Coherent  Speech Volume:Normal  Handedness:Right   Mood and Affect  Mood:Irritable; Anxious  Affect:Congruent   Thought Process  Thought Processes:Linear  Descriptions of Associations:Intact  Orientation:Full (Time, Place and Person)  Thought Content:Logical  History of Schizophrenia/Schizoaffective disorder:No  Duration of Psychotic Symptoms:No data recorded Hallucinations:Hallucinations: None  Ideas of Reference:None  Suicidal Thoughts:Suicidal Thoughts: Yes, Passive SI Passive Intent and/or Plan: Without Intent  Homicidal Thoughts:Homicidal Thoughts: No   Sensorium  Memory:Immediate Good; Recent Good; Remote Good  Judgment:-- (Improving)  Insight:Shallow   Executive Functions  Concentration:Fair  Attention Span:Fair  Recall:Fair  Fund of  Knowledge:Good  Language:Good   Psychomotor Activity  Psychomotor Activity:Psychomotor Activity: Normal   Assets  Assets:Resilience   Sleep  Sleep:Sleep: Fair Number of Hours of Sleep: 6.75    Physical Exam: Physical Exam HENT:     Head: Normocephalic and atraumatic.  Eyes:     Extraocular Movements: Extraocular movements intact.  Pulmonary:     Effort: Pulmonary effort is normal.  Skin:    General: Skin is dry.  Neurological:     Mental Status: She is alert and oriented to person, place, and time.   Review of Systems  Gastrointestinal:  Positive for abdominal pain and constipation.  Psychiatric/Behavioral:  Positive for depression and suicidal ideas.   Blood pressure 95/64, pulse 92, temperature 97.6 F (36.4 C), temperature source Oral, resp. rate 16, height 5\' 5"  (1.651 m), weight 64.4 kg, last menstrual period 11/20/2020, SpO2 100 %. Body mass index is 23.63 kg/m.   Treatment Plan Summary: Daily contact with patient to assess and evaluate symptoms and progress in treatment Mrs. Steelman is a 40 yr old female who presents with worsening depression and SI. 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 Patient continues to report depression, anxiety and passive suicidal ideation.   Continue to monitor her signs and symptoms.    Bipolar Disorder, mixed Episode (r/out with psychotic features): -Continue Seroquel 150 mg qhs. -EKG QTC 454, lipid profile shows cholesterol 159, LDL 102, triglycerides 89, HDL 39 and A1c 5.3  -Continue Remeron 7.5 mg at bedtime for depression. -Continue Seroquel to 50 mg daily for daytime anxiety. -Patient will be referred to ACTT services after discharge.  -CSW to help with info for [facility].   GAD by hx:  -Continue Klonopin to 0.5 mg nightly with nightly and the last night.   ADHD by hx:  - Holding  Vyvanse given concern for mixed manic episode - medication verified in PDMP    Cannabis use - episodic (r/o cannabis use d/o) - Counseled on need to abstain from substance use - UDS positive for THC  Tobacco use disorder - Continue nicotine patch 21 mg daily   Constipation:  -Continue Miralax daily -Continue Colace 100 mg as needed  as needed for constipation. - Dulcolax suppository 10 mg 1 time - May consider transfer to ED tomorrow for KUB and possible manual disimpaction.   -Continue PRN's: Tylenol, Maalox, Atarax, Milk of Magnesia, Colace   PGY-2 Bobbye Morton, MD 12/03/2020, 3:36 PM

## 2020-12-03 NOTE — [facility] Group Notes (Signed)
Psychoeducational Group Note    Date:12/03/2020 Time: 1300-1400    Purpose of Group: . The group focus' on teaching patients on how to identify their needs and how Life Skills:  A group where two lists are made. What people need and what are things that we do that are healthy. The lists are developed by the patients and it is explained that we often do the actions that are not healthy to get our list of needs met.  to develop the coping skills needed to get their needs met  Participation Level:  Active  Participation Quality:  Appropriate  Affect:  Appropriate  Cognitive:  Oriented  Insight:  Improving  Engagement in Group:  Engaged  Additional Comments:  Rates her energy at a 3/10. Given support and reassurance within the group setting. Provided feedback to her peers and received feedback.  Paulino Rily

## 2020-12-03 NOTE — Progress Notes (Signed)
   12/03/20 1700  Psych Admission Type (Psych Patients Only)  Admission Status Voluntary  Psychosocial Assessment  Patient Complaints Anxiety  Eye Contact Fair  Facial Expression Anxious  Affect Appropriate to circumstance  Speech Logical/coherent  Interaction Assertive  Motor Activity Fidgety;Restless  Appearance/Hygiene Unremarkable  Behavior Characteristics Cooperative  Mood Anxious  Aggressive Behavior  Targets Self  Type of Behavior Verbal  Effect No apparent injury  Thought Process  Coherency Circumstantial  Content Blaming self  Delusions None reported or observed  Perception WDL  Hallucination None reported or observed  Judgment Impaired  Confusion None  Danger to Self  Current suicidal ideation? Denies  Self-Injurious Behavior No self-injurious ideation or behavior indicators observed or expressed   Agreement Not to Harm Self Yes  Description of Agreement verbal  Danger to Others  Danger to Others None reported or observed

## 2020-12-03 NOTE — Progress Notes (Signed)
Pt continues to be anxious and concerned about moving forward after D/C    12/03/20 0000  Psych Admission Type (Psych Patients Only)  Admission Status Voluntary  Psychosocial Assessment  Patient Complaints Anxiety  Eye Contact Fair  Facial Expression Flat;Anxious;Animated  Affect Appropriate to circumstance  Furniture conservator/restorer;Restless  Appearance/Hygiene Unremarkable  Behavior Characteristics Cooperative  Mood Anxious  Thought Process  Coherency Circumstantial  Content Blaming self  Delusions None reported or observed  Perception WDL  Hallucination None reported or observed  Judgment Impaired  Confusion None  Danger to Self  Current suicidal ideation? Passive  Self-Injurious Behavior No self-injurious ideation or behavior indicators observed or expressed   Agreement Not to Harm Self Yes  Description of Agreement verbal  Danger to Others  Danger to Others None reported or observed

## 2020-12-03 NOTE — Progress Notes (Signed)
   12/03/20 2300  Psych Admission Type (Psych Patients Only)  Admission Status Voluntary  Psychosocial Assessment  Patient Complaints Anxiety;Worrying  Eye Contact Fair  Facial Expression Anxious  Affect Appropriate to circumstance  Speech Logical/coherent  Interaction Assertive  Motor Activity Fidgety;Restless  Appearance/Hygiene Unremarkable  Behavior Characteristics Cooperative  Mood Anxious;Pleasant  Aggressive Behavior  Targets Self  Type of Behavior Verbal  Effect No apparent injury  Thought Process  Coherency Circumstantial  Content Blaming self  Delusions None reported or observed  Perception WDL  Hallucination None reported or observed  Judgment Impaired  Confusion None  Danger to Self  Current suicidal ideation? Denies  Self-Injurious Behavior No self-injurious ideation or behavior indicators observed or expressed   Agreement Not to Harm Self Yes  Description of Agreement verbal  Danger to Others  Danger to Others None reported or observed

## 2020-12-03 NOTE — Progress Notes (Signed)
Adult Psychoeducational Group Note  Date:  12/03/2020 Time:  10:53 PM  Group Topic/Focus:  Wrap-Up Group:   The focus of this group is to help patients review their daily goal of treatment and discuss progress on daily workbooks.  Participation Level:  Active  Participation Quality:  Appropriate  Affect:  Appropriate  Cognitive:  Appropriate  Insight: Appropriate  Engagement in Group:  Engaged  Modes of Intervention:  Discussion  Additional Comments:  My coping skill is walking.  Bethann Punches 12/03/2020, 10:53 PM

## 2020-12-04 MED ORDER — BISACODYL 5 MG PO TBEC
5.0000 mg | DELAYED_RELEASE_TABLET | Freq: Every day | ORAL | Status: DC
  Administered 2020-12-05: 5 mg via ORAL
  Filled 2020-12-04 (×2): qty 1

## 2020-12-04 NOTE — Group Note (Signed)
LCSW Group Therapy Note   Group Date: 12/04/2020 Start Time: 1000 End Time: 1030   Type of Therapy and Topic:  Group Therapy: Boundaries  Participation Level:  Active  Description of Group: This group will address the use of boundaries in their personal lives. Patients will explore why boundaries are important, the difference between healthy and unhealthy boundaries, and negative and postive outcomes of different boundaries and will look at how boundaries can be crossed.  Patients will be encouraged to identify current boundaries in their own lives and identify what kind of boundary is being set. Facilitators will guide patients in utilizing problem-solving interventions to address and correct types boundaries being used and to address when no boundary is being used. Understanding and applying boundaries will be explored and addressed for obtaining and maintaining a balanced life. Patients will be encouraged to explore ways to assertively make their boundaries and needs known to significant others in their lives, using other group members and facilitator for role play, support, and feedback.  Therapeutic Goals:  1.  Patient will identify areas in their life where setting clear boundaries could be  used to improve their life.  2.  Patient will identify signs/triggers that a boundary is not being respected. 3.  Patient will identify two ways to set boundaries in order to achieve balance in  their lives: 4.  Patient will demonstrate ability to communicate their needs and set boundaries  through discussion and/or role plays  Summary of Patient Progress: Due to the acuity and low staffing, group was not held on the adult unit today. Patients were provided therapeutic worksheets and asked to meet with CSW as needed.  Therapeutic Modalities:   Cognitive Behavioral Therapy Solution-Focused Therapy  Elbie Statzer M Ninette Cotta, LCSWA 12/04/2020  10:25 AM    

## 2020-12-04 NOTE — [facility] Group Notes (Signed)
Pt attended group and contributed to group 

## 2020-12-04 NOTE — Progress Notes (Signed)
[facility] MD Progress Note  12/04/2020 3:21 PM [name]  MRN:  [id number] Subjective:  [name] is a 40 yr old female who presents with worsening depression and SI. 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   On assessment today patient reports that she feels that her stomach is getting better and that she is having small bowel movements.  Patient reports that she slept "pretty good."  Patient reports that she is eating okay and believes that the Seroquel is making her hungrier.  Patient reports overall her mood today is "pretty good, with some emotional pain."  Patient reports that today is the first day she has not had any SI and is also denying HI and AVH.  Patient reports that she had a friend from grade school come visit her last night and the visit overall went well.  Patient reports that she will rate her depression on the scale of 1-10 a 6 today.  Patient reports that yesterday she felt that it was a 5.  Patient reports that she is a bit anxious today but "this is worse in the morning and over the course of the day gets better."  Principal Problem: Bipolar disorder, curr episode mixed, severe, w/o psychotic features (HCC) Diagnosis: Principal Problem:   Bipolar disorder, curr episode mixed, severe, w/o psychotic features (HCC)  Total Time spent with patient: 15 minutes  Past Psychiatric History: See H&P  Past Medical History:  Past Medical History:  Diagnosis Date   Abnormal Pap smear    ADHD (attention deficit hyperactivity disorder)    Anemia    generalized anxiety disorder   Anxiety    Asthma    Blood transfusion    Pt delivery in 2009   Complication of anesthesia    Headache(784.0)    PPH (postpartum hemorrhage)    Spinal headache     Past Surgical History:  Procedure Laterality Date   TONSILLECTOMY  2011   Family History:  Family  History  Problem Relation Age of Onset   Thrombophlebitis Mother    Hypertension Father    Heart disease Maternal Grandmother    Heart disease Paternal Grandfather    Hypertension Paternal Grandfather    Family Psychiatric  History: See H&P Social History:  Social History   Substance and Sexual Activity  Alcohol Use No     Social History   Substance and Sexual Activity  Drug Use Yes   Types: Marijuana, Amphetamines   Comment: 07/18/15  "smoked off and on since age 69"    Social History   Socioeconomic History   Marital status: Legally Separated    Spouse name: Not on file   Number of children: 2   Years of education: 18   Highest education level: Not on file  Occupational History    Comment: home maker  Tobacco Use   Smoking status: Every Day    Packs/day: 1.00    Types: Cigarettes   Smokeless tobacco: Never  Vaping Use   Vaping Use: Never used  Substance and Sexual Activity   Alcohol use: No   Drug use: Yes    Types: Marijuana, Amphetamines    Comment: 07/18/15  "smoked off and on since age 89"   Sexual activity: Not Currently    Comment: Pt currently pregnant  Other Topics Concern   Not on file  Social History Narrative   Lives with mother and  children    caffeine use- coffee 1 cup in morning   Social Determinants of Health   Financial Resource Strain: Not on file  Food Insecurity: Not on file  Transportation Needs: Not on file  Physical Activity: Not on file  Stress: Not on file  Social Connections: Not on file   Additional Social History:                         Sleep: Good  Appetite:  Good  Current Medications: Current Facility-Administered Medications  Medication Dose Route Frequency Provider Last Rate Last Admin   acetaminophen (TYLENOL) tablet 650 mg  650 mg Oral Q6H PRN Mason Jim, Amy E, MD       alum & mag hydroxide-simeth (MAALOX/MYLANTA) 200-200-20 MG/5ML suspension 15 mL  15 mL Oral Q6H PRN Mason Jim, Amy E, MD   15 mL at  11/29/20 1641   docusate sodium (COLACE) capsule 100 mg  100 mg Oral Daily PRN Karsten Ro, MD   100 mg at 11/29/20 1135   hydrOXYzine (ATARAX/VISTARIL) tablet 25 mg  25 mg Oral TID PRN Comer Locket, MD   25 mg at 12/04/20 1301   magnesium hydroxide (MILK OF MAGNESIA) suspension 5 mL  5 mL Oral Daily PRN Bartholomew Crews E, MD   5 mL at 12/04/20 1301   mirtazapine (REMERON) tablet 7.5 mg  7.5 mg Oral QHS Doda, Vandana, MD   7.5 mg at 12/03/20 2052   nicotine (NICODERM CQ - dosed in mg/24 hours) patch 21 mg  21 mg Transdermal Daily Mason Jim, Amy E, MD   21 mg at 12/04/20 0741   polyethylene glycol (MIRALAX / GLYCOLAX) packet 17 g  17 g Oral Daily Mason Jim, Amy E, MD   17 g at 12/04/20 0742   QUEtiapine (SEROQUEL) tablet 150 mg  150 mg Oral QHS Doda, Traci Sermon, MD   150 mg at 12/03/20 2052   QUEtiapine (SEROQUEL) tablet 50 mg  50 mg Oral Daily Karsten Ro, MD   50 mg at 12/04/20 1610    Lab Results: No results found for this or any previous visit (from the past 48 hour(s)).  Blood Alcohol level:  Lab Results  Component Value Date   ETH <10 11/27/2020   ETH <5 07/10/2014    Metabolic Disorder Labs: Lab Results  Component Value Date   HGBA1C 5.3 11/29/2020   MPG 105.41 11/29/2020   MPG 117 05/27/2014   No results found for: PROLACTIN Lab Results  Component Value Date   CHOL 159 11/29/2020   TRIG 89 11/29/2020   HDL 39 (L) 11/29/2020   CHOLHDL 4.1 11/29/2020   VLDL 18 11/29/2020   LDLCALC 102 (H) 11/29/2020   LDLCALC 88 05/27/2014    Physical Findings: AIMS: Facial and Oral Movements Muscles of Facial Expression: None, normal Lips and Perioral Area: None, normal Jaw: None, normal Tongue: None, normal,Extremity Movements Upper (arms, wrists, hands, fingers): None, normal Lower (legs, knees, ankles, toes): None, normal, Trunk Movements Neck, shoulders, hips: None, normal, Overall Severity Severity of abnormal movements (highest score from questions above): None,  normal Incapacitation due to abnormal movements: None, normal Patient's awareness of abnormal movements (rate only patient's report): No Awareness, Dental Status Current problems with teeth and/or dentures?: No Does patient usually wear dentures?: No  CIWA:    COWS:     Musculoskeletal: Strength & Muscle Tone: within normal limits Gait & Station: normal Patient leans: N/A  Psychiatric Specialty Exam:  Presentation  General Appearance:  Appropriate for Environment  Eye Contact:Good  Speech:Clear and Coherent  Speech Volume:Normal  Handedness:Right   Mood and Affect  Mood:-- ("pretty good, some emotional pain.")  Affect:Flat   Thought Process  Thought Processes:Coherent  Descriptions of Associations:Intact  Orientation:Full (Time, Place and Person)  Thought Content:Logical  History of Schizophrenia/Schizoaffective disorder:No  Duration of Psychotic Symptoms:No data recorded Hallucinations:Hallucinations: None  Ideas of Reference:None  Suicidal Thoughts:Suicidal Thoughts: No SI Passive Intent and/or Plan: Without Intent  Homicidal Thoughts:Homicidal Thoughts: No   Sensorium  Memory:Immediate Good; Recent Good; Remote Fair  Judgment:-- (Improving)  Insight:-- (Improving)   Executive Functions  Concentration:Good  Attention Span:Good  Recall:Fair  Fund of Knowledge:Good  Language:Good   Psychomotor Activity  Psychomotor Activity:Psychomotor Activity: Normal   Assets  Assets:Desire for Improvement   Sleep  Sleep:Sleep: Good    Physical Exam: Physical Exam Constitutional:      Appearance: Normal appearance.  HENT:     Head: Normocephalic and atraumatic.  Eyes:     Extraocular Movements: Extraocular movements intact.  Pulmonary:     Effort: Pulmonary effort is normal.  Skin:    General: Skin is dry.  Neurological:     Mental Status: She is alert.   Review of Systems  Respiratory:  Negative for cough.   Cardiovascular:   Negative for chest pain.  Gastrointestinal:  Negative for abdominal pain.  Psychiatric/Behavioral:  Negative for suicidal ideas.   Blood pressure 99/73, pulse (!) 101, temperature (!) 97.5 F (36.4 C), temperature source Oral, resp. rate 18, height 5\' 5"  (1.651 m), weight 64.4 kg, last menstrual period 11/20/2020, SpO2 100 %. Body mass index is 23.63 kg/m.   Treatment Plan Summary: Daily contact with patient to assess and evaluate symptoms and progress in treatment [name] is a 40 yr old female who presents with worsening depression and SI. PPHx is significant for Bipolar Disorder - mixed with psychotic features, ADHD, GAD, 1 previous suicide attempt (ingestion), 4 previous hospitalizations (latest [facility] 2016).  Patient appeared with improved mood today and is denying SI for the first time since admission.  Patient also endorses positive thoughts regarding her constipation and is happy that she is having some movement and is not completely blocked.  Patient endorses some improvement in insight today as well.    Bipolar Disorder, mixed Episode (r/out with psychotic features): -Continue Seroquel 150 mg qhs. -EKG QTC 454, lipid profile shows cholesterol 159, LDL 102, triglycerides 89, HDL 39 and A1c 5.3  -Continue Remeron 7.5 mg at bedtime for depression. -Continue Seroquel to 50 mg daily for daytime anxiety. -Patient will be referred to ACTT services after discharge.  -CSW to help with info for [facility].   GAD by hx:  -Continue Klonopin to 0.5 mg nightly with nightly and the last night.   ADHD by hx:  - Holding Vyvanse given concern for mixed manic episode - medication verified in PDMP    Cannabis use - episodic (r/o cannabis use d/o) - Counseled on need to abstain from substance use - UDS positive for THC  Tobacco use disorder - Continue nicotine patch 21 mg daily   Constipation-improving:  -Continue Miralax daily -Continue Colace 100 mg as needed as needed for  constipation. - Dulcolax suppository 10 mg daily   -Continue PRN's: Tylenol, Maalox, Atarax, Milk of Magnesia, Colace  PGY-2 [place], MD 12/04/2020, 3:21 PM

## 2020-12-04 NOTE — Progress Notes (Signed)
   12/04/20 1300  Psych Admission Type (Psych Patients Only)  Admission Status Voluntary  Psychosocial Assessment  Patient Complaints Anxiety  Eye Contact Fair  Facial Expression Anxious  Affect Appropriate to circumstance  Speech Logical/coherent  Interaction Assertive  Motor Activity Fidgety;Restless  Appearance/Hygiene Unremarkable  Behavior Characteristics Cooperative  Mood Anxious;Pleasant  Aggressive Behavior  Targets Self  Type of Behavior Verbal  Effect No apparent injury  Thought Process  Coherency Circumstantial  Content Blaming self  Delusions None reported or observed  Perception WDL  Hallucination None reported or observed  Judgment Impaired  Confusion None  Danger to Self  Current suicidal ideation? Denies  Self-Injurious Behavior No self-injurious ideation or behavior indicators observed or expressed   Agreement Not to Harm Self Yes  Description of Agreement verbal  Danger to Others  Danger to Others None reported or observed

## 2020-12-04 NOTE — [facility] Group Notes (Signed)
Adult Psychoeducational Group Not Date:  12/04/2020 Time:  0900-1045 Group Topic/Focus: PROGRESSIVE RELAXATION. A group where deep breathing is taught and tensing and relaxation muscle groups is used. Imagery is used as well.  Pts are asked to imagine 3 pillars that hold them up when they are not able to hold themselves up.  Participation Level:  Active  Participation Quality:  Appropriate  Affect:  Appropriate  Cognitive:  Oriented  Insight: Improving  Engagement in Group:  Engaged  Modes of Intervention:  Activity, Discussion, Education, and Support  Additional Comments:  pt rates her energy at a 4/10. States the only thing that is holding her up is God  [name] A

## 2020-12-05 ENCOUNTER — Encounter (HOSPITAL_COMMUNITY): Payer: Self-pay

## 2020-12-05 NOTE — [facility] Group Notes (Signed)
[facility] Group Notes:  (Nursing/MHT/Case Management/Adjunct)  Date:  12/05/2020  Time:  10:23 PM  Type of Therapy:  Psychoeducational Skills  Participation Level:  Minimal  Participation Quality:  Attentive  Affect:  Appropriate  Cognitive:  Appropriate  Insight:  Improving  Engagement in Group:  Improving  Modes of Intervention:  Education  Summary of Progress/Problems: The patient attended group and was appropriate.   Hazle Coca S 12/05/2020, 10:23 PM

## 2020-12-05 NOTE — [facility] Counselor (Signed)
CSW provided the Pt with a list of [address] in the [place], [place], and [place] areas.  CSW explained to the Pt that she would need to call the [facility] to see if they had available spaces and beds.  CSW will follow-up with the Pt to see if any of these listings were helpful.

## 2020-12-05 NOTE — Group Note (Signed)
Recreation Therapy Group Note   Group Topic:Stress Management  Group Date: 12/05/2020 Start Time: 0940 End Time: 0952 Facilitators: Caroll Rancher, LRT/CTRS Location: [address]   Goal Area(s) Addresses:  Patient will actively participate in stress management techniques presented during session.  Patient will successfully identify benefit of practicing stress management post d/c.   Group Description: Meditation. LRT played a meditation that focused on being resilient in the face of adversity.  Patients were to listen and follow along as meditation played to engage in group. Patients were given suggestions of ways to access scripts post d/c and encouraged to explore Youtube and other apps available on smartphones, tablets, and computers.   Affect/Mood: N/A   Participation Level: Did not attend    Clinical Observations/Individualized Feedback: Pt did not attend group.    Plan: Continue to engage patient in RT group sessions 2-3x/week.   Caroll Rancher, LRT/CTRS 12/05/2020 12:37 PM

## 2020-12-05 NOTE — Group Note (Signed)
Occupational Therapy Group Note  Group Topic:Self-Esteem  Group Date: 12/05/2020 Start Time: 1400 End Time: 1435 Facilitators: Kinzy Weyers, OT/L   Group Description: Group encouraged increased engagement and participation through discussion and activity focused on self-esteem. Patients explored and discussed the differences between healthy and low self-esteem and how it affects our daily lives and occupations with a focus on relationships, work, school, self-care, and personal leisure interests. Group discussion then transitioned into identifying specific strategies to boost self-esteem and engaged in a collaborative and independent activity looking at positive ways to describe oneself A-Z.   Therapeutic Goal(s): Understand and recognize the differences between healthy and low self-esteem Identify healthy strategies to improve/build self-esteem    Participation Level: Did not attend   Plan: Continue to engage patient in OT groups 2 - 3x/week.  12/05/2020  Kasir Hallenbeck, OT/L 

## 2020-12-05 NOTE — BH IP Treatment Plan (Signed)
Interdisciplinary Treatment and Diagnostic Plan Update  12/05/2020 Time of Session: 10:15am [name] MRN: [id number]  Principal Diagnosis: Bipolar disorder, curr episode mixed, severe, w/o psychotic features (HCC)  Secondary Diagnoses: Principal Problem:   Bipolar disorder, curr episode mixed, severe, w/o psychotic features (HCC)   Current Medications:  Current Facility-Administered Medications  Medication Dose Route Frequency Provider Last Rate Last Admin   acetaminophen (TYLENOL) tablet 650 mg  650 mg Oral Q6H PRN Mason Jim, Amy E, MD       alum & mag hydroxide-simeth (MAALOX/MYLANTA) 200-200-20 MG/5ML suspension 15 mL  15 mL Oral Q6H PRN Mason Jim, Amy E, MD   15 mL at 11/29/20 1641   bisacodyl (DULCOLAX) EC tablet 5 mg  5 mg Oral Q lunch Eliseo Gum B, MD       docusate sodium (COLACE) capsule 100 mg  100 mg Oral Daily PRN Karsten Ro, MD   100 mg at 12/04/20 2015   hydrOXYzine (ATARAX/VISTARIL) tablet 25 mg  25 mg Oral TID PRN Comer Locket, MD   25 mg at 12/04/20 1301   magnesium hydroxide (MILK OF MAGNESIA) suspension 5 mL  5 mL Oral Daily PRN Bartholomew Crews E, MD   5 mL at 12/04/20 1301   mirtazapine (REMERON) tablet 7.5 mg  7.5 mg Oral QHS Doda, Vandana, MD   7.5 mg at 12/04/20 2012   nicotine (NICODERM CQ - dosed in mg/24 hours) patch 21 mg  21 mg Transdermal Daily Mason Jim, Amy E, MD   21 mg at 12/05/20 0814   polyethylene glycol (MIRALAX / GLYCOLAX) packet 17 g  17 g Oral Daily Bartholomew Crews E, MD   17 g at 12/05/20 0814   QUEtiapine (SEROQUEL) tablet 150 mg  150 mg Oral QHS Karsten Ro, MD   150 mg at 12/04/20 2012   QUEtiapine (SEROQUEL) tablet 50 mg  50 mg Oral Daily Karsten Ro, MD   50 mg at 12/05/20 0816   PTA Medications: Medications Prior to Admission  Medication Sig Dispense Refill Last Dose   ARIPiprazole (ABILIFY) 5 MG tablet Take 5 mg by mouth at bedtime.      clonazePAM (KLONOPIN) 0.5 MG tablet Take 1 mg by mouth 2 (two) times daily.       Levonorgestrel-Ethinyl Estradiol (DAYSEE) 0.15-0.03 &0.01 MG tablet Take 1 tablet by mouth daily. (Patient not taking: Reported on 11/27/2020) 91 tablet 2    naproxen (NAPROSYN) 375 MG tablet Take 1 tablet (375 mg total) by mouth 2 (two) times daily. (Patient not taking: Reported on 11/27/2020) 20 tablet 0    ondansetron (ZOFRAN) 4 MG tablet Take 1 tablet (4 mg total) by mouth every 8 (eight) hours as needed for nausea or vomiting. (Patient not taking: Reported on 11/27/2020) 10 tablet 0    traZODone (DESYREL) 100 MG tablet Take 1 tablet (100 mg total) by mouth at bedtime. For insomnia 30 tablet 0    VYVANSE 50 MG capsule Take 50 mg by mouth every morning.       Patient Stressors: Legal issue   Marital or family conflict   Substance abuse   Traumatic event    Patient Strengths: Capable of independent living  Education administrator  Supportive family/friends   Treatment Modalities: Medication Management, Group therapy, Case management,  1 to 1 session with clinician, Psychoeducation, Recreational therapy.   Physician Treatment Plan for Primary Diagnosis: Bipolar disorder, curr episode mixed, severe, w/o psychotic features (HCC) Long Term Goal(s): Improvement in symptoms so as ready for  discharge   Short Term Goals: Ability to identify changes in lifestyle to reduce recurrence of condition will improve Ability to verbalize feelings will improve Ability to disclose and discuss suicidal ideas Ability to identify and develop effective coping behaviors will improve Ability to identify triggers associated with substance abuse/mental health issues will improve  Medication Management: Evaluate patient's response, side effects, and tolerance of medication regimen.  Therapeutic Interventions: 1 to 1 sessions, Unit Group sessions and Medication administration.  Evaluation of Outcomes: Adequate for Discharge  Physician Treatment Plan for Secondary Diagnosis: Principal Problem:    Bipolar disorder, curr episode mixed, severe, w/o psychotic features (HCC)  Long Term Goal(s): Improvement in symptoms so as ready for discharge   Short Term Goals: Ability to identify changes in lifestyle to reduce recurrence of condition will improve Ability to verbalize feelings will improve Ability to disclose and discuss suicidal ideas Ability to identify and develop effective coping behaviors will improve Ability to identify triggers associated with substance abuse/mental health issues will improve     Medication Management: Evaluate patient's response, side effects, and tolerance of medication regimen.  Therapeutic Interventions: 1 to 1 sessions, Unit Group sessions and Medication administration.  Evaluation of Outcomes: Adequate for Discharge   RN Treatment Plan for Primary Diagnosis: Bipolar disorder, curr episode mixed, severe, w/o psychotic features (HCC) Long Term Goal(s): Knowledge of disease and therapeutic regimen to maintain health will improve  Short Term Goals: Ability to remain free from injury will improve, Ability to verbalize frustration and anger appropriately will improve, Ability to demonstrate self-control, Ability to identify and develop effective coping behaviors will improve, and Compliance with prescribed medications will improve  Medication Management: RN will administer medications as ordered by provider, will assess and evaluate patient's response and provide education to patient for prescribed medication. RN will report any adverse and/or side effects to prescribing provider.  Therapeutic Interventions: 1 on 1 counseling sessions, Psychoeducation, Medication administration, Evaluate responses to treatment, Monitor vital signs and CBGs as ordered, Perform/monitor CIWA, COWS, AIMS and Fall Risk screenings as ordered, Perform wound care treatments as ordered.  Evaluation of Outcomes: Adequate for Discharge   LCSW Treatment Plan for Primary Diagnosis:  Bipolar disorder, curr episode mixed, severe, w/o psychotic features (HCC) Long Term Goal(s): Safe transition to appropriate next level of care at discharge, Engage patient in therapeutic group addressing interpersonal concerns.  Short Term Goals: Engage patient in aftercare planning with referrals and resources, Increase social support, Increase ability to appropriately verbalize feelings, Identify triggers associated with mental health/substance abuse issues, and Increase skills for wellness and recovery  Therapeutic Interventions: Assess for all discharge needs, 1 to 1 time with Social worker, Explore available resources and support systems, Assess for adequacy in community support network, Educate family and significant other(s) on suicide prevention, Complete Psychosocial Assessment, Interpersonal group therapy.  Evaluation of Outcomes: Adequate for Discharge   Progress in Treatment: Attending groups: Yes. Participating in groups: Yes. Taking medication as prescribed: Yes. Toleration medication: Yes. Family/Significant other contact made: Yes, individual(s) contacted:  Mother and father  Patient understands diagnosis: Yes. Discussing patient identified problems/goals with staff: Yes. Medical problems stabilized or resolved: Yes. Denies suicidal/homicidal ideation: Yes. Issues/concerns per patient self-inventory: No.     New problem(s) identified: No, Describe:  None    New Short Term/Long Term Goal(s): medication stabilization, elimination of SI thoughts, development of comprehensive mental wellness plan.    Patient Goals: "To get better, whatever it takes"    Discharge Plan or Barriers: Patient is  to restart with ACTT at discharge   Reason for Continuation of Hospitalization: Depression Medication stabilization  Estimated Length of Stay: 1 day   Scribe for Treatment Team: Otelia Santee, LCSW 12/05/2020 10:46 AM

## 2020-12-05 NOTE — Progress Notes (Signed)
[name] MD Progress Note  12/05/2020 2:03 PM [name]  MRN:  [id number] Subjective:  [name] is a 40 yr old female who presents with worsening depression and SI. 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   On assessment today patient reports that she feels tired today.  She states she still feels emotional pain. She states her mood is getting better. She denies any active or passive suicidal ideation, homicidal ideation.  She denies auditory visual hallucinations.  She states she did not sleep well last night and wants to get her sleep right before discharge.  She reports good appetite and has been eating all her meals.  She states she will follow-up with ACTT team after discharge.  CSW gave her list of [facility].  She states she will do [facility] IOP after discharge because of upcoming court dates.  She states that she will go back to her friend's house after discharge.  She states her constipation is much better today and she has had bowel movement.  She has been tolerating her medications and denies any side effects.  She has been using her coping skills such as walking, talking, and deep breathing.  Principal Problem: Bipolar disorder, curr episode mixed, severe, w/o psychotic features (HCC) Diagnosis: Principal Problem:   Bipolar disorder, curr episode mixed, severe, w/o psychotic features (HCC)  Total Time spent with patient: 15 minutes  Past Psychiatric History: See H&P  Past Medical History:  Past Medical History:  Diagnosis Date   Abnormal Pap smear    ADHD (attention deficit hyperactivity disorder)    Anemia    generalized anxiety disorder   Anxiety    Asthma    Blood transfusion    Pt delivery in 2009   Complication of anesthesia    Headache(784.0)    PPH (postpartum hemorrhage)    Spinal headache     Past Surgical History:  Procedure  Laterality Date   TONSILLECTOMY  2011   Family History:  Family History  Problem Relation Age of Onset   Thrombophlebitis Mother    Hypertension Father    Heart disease Maternal Grandmother    Heart disease Paternal Grandfather    Hypertension Paternal Grandfather    Family Psychiatric  History: See H&P Social History:  Social History   Substance and Sexual Activity  Alcohol Use No     Social History   Substance and Sexual Activity  Drug Use Yes   Types: Marijuana, Amphetamines   Comment: 07/18/15  "smoked off and on since age 9"    Social History   Socioeconomic History   Marital status: Legally Separated    Spouse name: Not on file   Number of children: 2   Years of education: 18   Highest education level: Not on file  Occupational History    Comment: home maker  Tobacco Use   Smoking status: Every Day    Packs/day: 1.00    Types: Cigarettes   Smokeless tobacco: Never  Vaping Use   Vaping Use: Never used  Substance and Sexual Activity   Alcohol use: No   Drug use: Yes    Types: Marijuana, Amphetamines    Comment: 07/18/15  "smoked off and on since age 55"   Sexual activity: Not Currently    Comment: Pt currently pregnant  Other Topics Concern   Not on file  Social History Narrative   Lives with  mother and children    caffeine use- coffee 1 cup in morning   Social Determinants of Health   Financial Resource Strain: Not on file  Food Insecurity: Not on file  Transportation Needs: Not on file  Physical Activity: Not on file  Stress: Not on file  Social Connections: Not on file   Additional Social History:                         Sleep: Fair  Appetite:  Good  Current Medications: Current Facility-Administered Medications  Medication Dose Route Frequency Provider Last Rate Last Admin   acetaminophen (TYLENOL) tablet 650 mg  650 mg Oral Q6H PRN Mason Jim, Amy E, MD       alum & mag hydroxide-simeth (MAALOX/MYLANTA) 200-200-20 MG/5ML  suspension 15 mL  15 mL Oral Q6H PRN Mason Jim, Amy E, MD   15 mL at 11/29/20 1641   bisacodyl (DULCOLAX) EC tablet 5 mg  5 mg Oral Q lunch Eliseo Gum B, MD   5 mg at 12/05/20 1158   docusate sodium (COLACE) capsule 100 mg  100 mg Oral Daily PRN Karsten Ro, MD   100 mg at 12/04/20 2015   hydrOXYzine (ATARAX/VISTARIL) tablet 25 mg  25 mg Oral TID PRN Comer Locket, MD   25 mg at 12/04/20 1301   magnesium hydroxide (MILK OF MAGNESIA) suspension 5 mL  5 mL Oral Daily PRN Bartholomew Crews E, MD   5 mL at 12/04/20 1301   mirtazapine (REMERON) tablet 7.5 mg  7.5 mg Oral QHS Dniya Neuhaus, MD   7.5 mg at 12/04/20 2012   nicotine (NICODERM CQ - dosed in mg/24 hours) patch 21 mg  21 mg Transdermal Daily Mason Jim, Amy E, MD   21 mg at 12/05/20 0814   polyethylene glycol (MIRALAX / GLYCOLAX) packet 17 g  17 g Oral Daily Mason Jim, Amy E, MD   17 g at 12/05/20 0814   QUEtiapine (SEROQUEL) tablet 150 mg  150 mg Oral QHS Karsten Ro, MD   150 mg at 12/04/20 2012   QUEtiapine (SEROQUEL) tablet 50 mg  50 mg Oral Daily Karsten Ro, MD   50 mg at 12/05/20 0816    Lab Results: No results found for this or any previous visit (from the past 48 hour(s)).  Blood Alcohol level:  Lab Results  Component Value Date   ETH <10 11/27/2020   ETH <5 07/10/2014    Metabolic Disorder Labs: Lab Results  Component Value Date   HGBA1C 5.3 11/29/2020   MPG 105.41 11/29/2020   MPG 117 05/27/2014   No results found for: PROLACTIN Lab Results  Component Value Date   CHOL 159 11/29/2020   TRIG 89 11/29/2020   HDL 39 (L) 11/29/2020   CHOLHDL 4.1 11/29/2020   VLDL 18 11/29/2020   LDLCALC 102 (H) 11/29/2020   LDLCALC 88 05/27/2014    Physical Findings: AIMS: Facial and Oral Movements Muscles of Facial Expression: None, normal Lips and Perioral Area: None, normal Jaw: None, normal Tongue: None, normal,Extremity Movements Upper (arms, wrists, hands, fingers): None, normal Lower (legs, knees, ankles,  toes): None, normal, Trunk Movements Neck, shoulders, hips: None, normal, Overall Severity Severity of abnormal movements (highest score from questions above): None, normal Incapacitation due to abnormal movements: None, normal Patient's awareness of abnormal movements (rate only patient's report): No Awareness, Dental Status Current problems with teeth and/or dentures?: No Does patient usually wear dentures?: No  CIWA:    COWS:  Musculoskeletal: Strength & Muscle Tone: within normal limits Gait & Station: normal Patient leans: N/A  Psychiatric Specialty Exam:  Presentation  General Appearance: Appropriate for Environment  Eye Contact:Good  Speech:Clear and Coherent  Speech Volume:Normal  Handedness:Right   Mood and Affect  Mood:-- (Tired, some emotional pain.)  Affect:Constricted   Thought Process  Thought Processes:Coherent  Descriptions of Associations:Intact  Orientation:Full (Time, Place and Person)  Thought Content:Logical  History of Schizophrenia/Schizoaffective disorder:No  Duration of Psychotic Symptoms:No data recorded Hallucinations:Hallucinations: None  Ideas of Reference:None  Suicidal Thoughts:Suicidal Thoughts: No  Homicidal Thoughts:Homicidal Thoughts: No   Sensorium  Memory:Immediate Good; Recent Good; Remote Fair  Judgment:Poor  Insight:-- (Improving)   Executive Functions  Concentration:Good  Attention Span:Good  Recall:Fair  Fund of Knowledge:Good  Language:Good   Psychomotor Activity  Psychomotor Activity:Psychomotor Activity: Normal   Assets  Assets:Desire for Improvement   Sleep  Sleep:Sleep: Good Number of Hours of Sleep: 6.75    Physical Exam: Physical Exam Constitutional:      Appearance: Normal appearance.  HENT:     Head: Normocephalic and atraumatic.  Eyes:     Extraocular Movements: Extraocular movements intact.  Pulmonary:     Effort: Pulmonary effort is normal.  Skin:    General:  Skin is dry.  Neurological:     Mental Status: She is alert.   Review of Systems  Respiratory:  Negative for cough.   Cardiovascular:  Negative for chest pain.  Gastrointestinal:  Negative for abdominal pain.  Psychiatric/Behavioral:  Negative for suicidal ideas.   Blood pressure 111/82, pulse 79, temperature (!) 97.5 F (36.4 C), temperature source Oral, resp. rate 18, height 5\' 5"  (1.651 m), weight 64.4 kg, last menstrual period 11/20/2020, SpO2 100 %. Body mass index is 23.63 kg/m.   Treatment Plan Summary: Daily contact with patient to assess and evaluate symptoms and progress in treatment [name] is a 40 yr old female who presents with worsening depression and SI. 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  Patient appeared with improved mood today and is denying active and passive SI, HI.  Patient denies any constipation today.She states her mood is getting better.  Patient looking forward to follow-up with her ACTT team and SA IOP possibly at [facility].  She was provided with list of [facility] by CSW.    Bipolar Disorder, mixed Episode (r/out with psychotic features): -Continue Seroquel 150 mg qhs. -EKG QTC 454, lipid profile shows cholesterol 159, LDL 102, triglycerides 89, HDL 39 and A1c 5.3  -Continue Remeron 7.5 mg at bedtime for depression. -Continue Seroquel 50 mg daily for daytime anxiety. -Patient will be referred to ACTT services after discharge.  -CSW provided information about list of [facility].   GAD by hx:  -Klonopin tapered and stopped recently.   ADHD by hx:  - Holding Vyvanse given concern for mixed manic episode - medication verified in PDMP    Cannabis use - episodic (r/o cannabis use d/o) - Counseled on need to abstain from substance use - UDS positive for THC  Tobacco use disorder - Continue nicotine patch 21  mg daily   Constipation-improving:  -Continue Miralax daily -Continue Colace 100 mg as needed as needed for constipation. - Dulcolax suppository 10 mg daily   -Continue PRN's: Tylenol, Maalox, Atarax, Milk of Magnesia, Colace  Disposition: 1 to 2 days. Possible discharge tomorrow. PGY-2 [facility], MD 12/05/2020, 2:03  PM

## 2020-12-05 NOTE — [facility] Group Notes (Signed)
Spiritual care group on grief and loss facilitated by chaplain Dyanne Carrel, [facility]   Group Goal:   Support / Education around grief and loss   Members engage in facilitated group support and psycho-social education.   Group Description:   Following introductions and group rules, group members engaged in facilitated group dialog and support around topic of loss, with particular support around experiences of loss in their lives. Group Identified types of loss (relationships / self / things) and identified patterns, circumstances, and changes that precipitate losses. Reflected on thoughts / feelings around loss, normalized grief responses, and recognized variety in grief experience. Group noted Worden's four tasks of grief in discussion.   Group drew on Adlerian / Rogerian, narrative, MI,   Patient Progress: [name] attended group.  Her participation in the conversation was minimal, but she showed attentiveness and engagement.  Chaplain Dyanne Carrel, [facility] Pager, [phone] 9:49 PM

## 2020-12-05 NOTE — Progress Notes (Signed)
   12/05/20 2107  Psych Admission Type (Psych Patients Only)  Admission Status Voluntary  Psychosocial Assessment  Patient Complaints Anxiety;Crying spells  Eye Contact Fair  Facial Expression Anxious;Trembling lip  Affect Appropriate to circumstance  Speech Logical/coherent  Interaction Assertive;Attention-seeking;Needy  Motor Activity Fidgety;Restless  Appearance/Hygiene Unremarkable  Behavior Characteristics Cooperative;Appropriate to situation  Mood Anxious  Thought Process  Coherency Circumstantial  Content Blaming self  Delusions None reported or observed  Perception WDL  Hallucination None reported or observed  Judgment Impaired  Confusion None  Danger to Self  Current suicidal ideation? Passive  Self-Injurious Behavior No self-injurious ideation or behavior indicators observed or expressed   Agreement Not to Harm Self Yes  Description of Agreement Verbal contract  Danger to Others  Danger to Others None reported or observed

## 2020-12-05 NOTE — Group Note (Signed)
LCSW Group Therapy Note   Group Date: 12/05/2020 Start Time: 1300 End Time: 1400  Group Date: 12/05/2020 Start Time: 1300 End Time: 1400 LCSW Group Therapy Note  Type of Therapy and Topic:  Group Therapy - Healthy vs Unhealthy Coping Skills  Participation Level:  Active  Description of Group The focus of this group was to determine what unhealthy coping techniques typically are used by group members and what healthy coping techniques would be helpful in coping with various problems. Patients were guided in becoming aware of the differences between healthy and unhealthy coping techniques. Patients were asked to identify 2-3 healthy coping skills they would like to learn to use more effectively.  Therapeutic Goals 1. Patients learned that coping is what human beings do all day long to deal with various situations in their lives 2. Patients defined and discussed healthy vs unhealthy coping techniques 3. Patients identified their preferred coping techniques and identified whether these were healthy or unhealthy 4. Patients determined 2-3 healthy coping skills they would like to become more familiar with and use more often. 5. Patients provided support and ideas to each other   Summary of Patient Progress: Pt introduced herself during group and stated that she gets waxing done as a form of self-care.   Therapeutic Modalities Cognitive Behavioral Therapy Motivational Interviewing  Chrys Racer 12/05/2020  1:45 PM

## 2020-12-05 NOTE — Plan of Care (Signed)
°  Problem: Education: °Goal: Emotional status will improve °Outcome: Progressing °Goal: Mental status will improve °Outcome: Progressing °Goal: Verbalization of understanding the information provided will improve °Outcome: Progressing °  °

## 2020-12-05 NOTE — [facility] Group Notes (Signed)
Patient did not attend group.

## 2020-12-05 NOTE — Progress Notes (Signed)
   12/05/20 1000  Psych Admission Type (Psych Patients Only)  Admission Status Voluntary  Psychosocial Assessment  Patient Complaints Anxiety  Eye Contact Fair  Facial Expression Anxious  Affect Appropriate to circumstance  Speech Logical/coherent  Interaction Assertive  Motor Activity Fidgety;Restless  Appearance/Hygiene Unremarkable  Behavior Characteristics Cooperative  Mood Anxious  Thought Process  Coherency Circumstantial  Content Blaming self  Delusions None reported or observed  Perception WDL  Hallucination None reported or observed  Judgment Impaired  Confusion None  Danger to Self  Current suicidal ideation? Denies  Self-Injurious Behavior No self-injurious ideation or behavior indicators observed or expressed   Agreement Not to Harm Self Yes  Description of Agreement verbal  Danger to Others  Danger to Others None reported or observed

## 2020-12-05 NOTE — Progress Notes (Signed)
     12/04/20 2015  Psych Admission Type (Psych Patients Only)  Admission Status Voluntary  Psychosocial Assessment  Patient Complaints Anxiety;Depression;Worrying  Eye Contact Fair  Facial Expression Anxious  Affect Appropriate to circumstance  Speech Logical/coherent  Interaction Assertive  Motor Activity Fidgety;Restless  Appearance/Hygiene Unremarkable  Behavior Characteristics Cooperative  Mood Depressed;Anxious  Thought Process  Coherency Circumstantial  Content Blaming self  Delusions None reported or observed  Perception WDL  Hallucination None reported or observed  Judgment Impaired  Confusion None  Danger to Self  Current suicidal ideation? Denies  Self-Injurious Behavior No self-injurious ideation or behavior indicators observed or expressed   Agreement Not to Harm Self Yes  Description of Agreement verbal  Danger to Others  Danger to Others None reported or observed

## 2020-12-06 MED ORDER — MIRTAZAPINE 15 MG PO TABS
15.0000 mg | ORAL_TABLET | Freq: Every day | ORAL | Status: DC
  Administered 2020-12-06 – 2020-12-07 (×2): 15 mg via ORAL
  Filled 2020-12-06 (×4): qty 1

## 2020-12-06 MED ORDER — BISACODYL 5 MG PO TBEC
5.0000 mg | DELAYED_RELEASE_TABLET | Freq: Every day | ORAL | Status: DC | PRN
  Administered 2020-12-06 – 2020-12-07 (×2): 5 mg via ORAL
  Filled 2020-12-06: qty 1

## 2020-12-06 MED ORDER — QUETIAPINE FUMARATE 200 MG PO TABS
200.0000 mg | ORAL_TABLET | Freq: Every day | ORAL | Status: DC
  Administered 2020-12-06 – 2020-12-07 (×2): 200 mg via ORAL
  Filled 2020-12-06 (×4): qty 1

## 2020-12-06 NOTE — Group Note (Signed)
Recreation Therapy Group Note   Group Topic:Animal Assisted Therapy   Group Date: 12/06/2020 Start Time: 1430 End Time: 1500 Facilitators: Caroll Rancher, LRT/CTRS Location: [address]   AAA/T Program Assumption of Risk Form signed by Patient/ or Parent Legal Guardian Yes  Patient is free of allergies or severe asthma Yes  Patient reports no fear of animals Yes  Patient reports no history of cruelty to animals Yes  Patient understands his/her participation is voluntary Yes  Patient washes hands before animal contact Yes  Patient washes hands after animal contact Yes   Affect/Mood: Appropriate   Participation Level: Engaged   Participation Quality: Independent   Behavior: Appropriate   Speech/Thought Process: Focused   Insight: Good   Judgement: Good   Modes of Intervention: Teaching laboratory technician   Patient Response to Interventions:  Engaged   Education Outcome:  Acknowledges education and In group clarification offered    Clinical Observations/Individualized Feedback:  Patient attended session and interacted appropriately with therapy dog and peers. Patient asked appropriate questions about therapy dog and his training. Patient shared stories about their pets at home with group.   Plan: Continue to engage patient in RT group sessions 2-3x/week.   Caroll Rancher, LRT/CTRS 12/06/2020 4:10 PM

## 2020-12-06 NOTE — [facility] Group Notes (Signed)
[facility] Group Notes:  (Nursing/MHT/Case Management/Adjunct)  Date:  12/06/2020  Time:  5:17 PM  Type of Therapy:  Group Therapy  Participation Level:  Active  Participation Quality:  Appropriate  Affect:  Appropriate  Cognitive:  Appropriate  Insight:  Appropriate  Engagement in Group:  Engaged  Modes of Intervention:  Discussion  Summary of Progress/Problems:Patient was aware of the triggers that causes her to become  angry. Dicussed with patient different ways to respond to anger.    Reymundo Poll 12/06/2020, 5:17 PM

## 2020-12-06 NOTE — [facility] Group Notes (Signed)
[facility] Group Notes:  (Nursing/MHT/Case Management/Adjunct)  Date:  12/06/2020  Time:  10:24 PM  Type of Therapy:  Psychoeducational Skills  Participation Level:  Active  Participation Quality:  Appropriate  Affect:  Appropriate  Cognitive:  Appropriate  Insight:  Good  Engagement in Group:  Engaged  Modes of Intervention:  Education  Summary of Progress/Problems: Patient rated her day as a 6 out of 10. Patient states that she had a rough day to begin with,but that it improved after she received a call from [facility].   Hazle Coca S 12/06/2020, 10:24 PM

## 2020-12-06 NOTE — Progress Notes (Signed)
Pt denies HI/AVH but does endorse SI and verbally agrees to approach staff if these become apparent or before harming themselves/others. Rates depression 8/10. Rates anxiety 9/10. Rates pain 0/10. Pt came up to med window crying. Pt stated that she was having thoughts of hurting herself but did not have a plan. "I do not actually want to die because of my kids because I love them." RN asked what is causing her to have those thoughts and pt stated, "I just keep hurting the people around me. I keep hurting my family." Pt would calm down but then would start crying again. Pt later in the day is talking to other pt and one specifically that she has become fond of. Pts seen being giddy with each other. Pt was preoccupied with her medications and when the doctors were going to change her orders. Pt also seen pacing the hallway. Scheduled medications administered to Pt, per MD orders. RN provided support and encouragement to Pt. Q15 min safety checks implemented and continued. Pt safe on the unit. RN will continue to monitor and intervene as needed.   12/06/20 0742  Psych Admission Type (Psych Patients Only)  Admission Status Voluntary  Psychosocial Assessment  Patient Complaints Crying spells;Self-harm thoughts;Anxiety;Depression  Eye Contact Fair  Facial Expression Anxious;Trembling lip  Affect Labile  Speech Soft;Logical/coherent  Interaction Assertive;Needy  Motor Activity Restless;Pacing  Appearance/Hygiene Unremarkable  Behavior Characteristics Cooperative;Anxious;Restless;Pacing  Mood Depressed;Anxious;Preoccupied  Aggressive Behavior  Effect No apparent injury  Thought Process  Coherency Circumstantial  Content Blaming self  Delusions None reported or observed  Perception WDL  Hallucination None reported or observed  Judgment Impaired  Confusion None  Danger to Self  Current suicidal ideation? Passive  Self-Injurious Behavior No self-injurious ideation or behavior indicators observed or  expressed   Agreement Not to Harm Self Yes  Description of Agreement Verbal contract  Danger to Others  Danger to Others None reported or observed

## 2020-12-06 NOTE — Progress Notes (Signed)
   12/06/20 2000  Psych Admission Type (Psych Patients Only)  Admission Status Voluntary  Psychosocial Assessment  Patient Complaints Crying spells  Eye Contact Fair  Facial Expression Anxious;Trembling lip  Affect Labile  Speech Soft;Logical/coherent  Interaction Assertive;Needy  Motor Activity Restless;Pacing  Appearance/Hygiene Unremarkable  Behavior Characteristics Cooperative;Anxious  Mood Depressed;Anxious  Aggressive Behavior  Effect No apparent injury  Thought Process  Coherency Circumstantial  Content Blaming self  Delusions None reported or observed  Perception WDL  Hallucination None reported or observed  Judgment Impaired  Confusion None  Danger to Self  Current suicidal ideation? Passive  Self-Injurious Behavior No self-injurious ideation or behavior indicators observed or expressed   Agreement Not to Harm Self Yes  Description of Agreement Verbal contract  Danger to Others  Danger to Others None reported or observed

## 2020-12-06 NOTE — Progress Notes (Addendum)
[facility] MD Progress Note  12/06/2020 5:59 PM [name]  MRN:  [id number] Subjective:  [name] is a 41 yr old female who presents with worsening depression and SI. 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   On assessment today patient continues to report depression and anxiety.  She states she did not sleep well last night and do not feel good today.  She states she had a mental breakdown yesterday.  She states she talked to her mom and had a good conversation.  She states she feels that she is hurting everyone emotionally and causing them emotional pain.  Patient reports active suicidal ideation and plan to slit her wrist.  She contracts for safety and states that she does not want to hurt herself.  She states she wants to talk to her husband to make things better.  She has been you using her coping skills such as walking, attending groups,.  I asked her to write 20 coping strategies.  She states her mom is helping her ex husband with her kids.  She states her ex husband is calling her mom multiple times in a day.  She states she does not understand why he is calling her mom as she is her mom and not his. Patient states she feels emotionally exhausted and need to heal.  She denies any HI, AVH. She states her constipation is much better today and she has had bowel movement. She has been tolerating her medication without any side effects.  Discussed about increasing the dose of Seroquel and Remeron.  Patient verbalizes understanding.   Principal Problem: Bipolar disorder, curr episode mixed, severe, w/o psychotic features (HCC) Diagnosis: Principal Problem:   Bipolar disorder, curr episode mixed, severe, w/o psychotic features (HCC)  Total Time spent with patient: 15 minutes  Past Psychiatric History: See H&P  Past Medical History:  Past Medical History:   Diagnosis Date   Abnormal Pap smear    ADHD (attention deficit hyperactivity disorder)    Anemia    generalized anxiety disorder   Anxiety    Asthma    Blood transfusion    Pt delivery in 2009   Complication of anesthesia    Headache(784.0)    PPH (postpartum hemorrhage)    Spinal headache     Past Surgical History:  Procedure Laterality Date   TONSILLECTOMY  2011   Family History:  Family History  Problem Relation Age of Onset   Thrombophlebitis Mother    Hypertension Father    Heart disease Maternal Grandmother    Heart disease Paternal Grandfather    Hypertension Paternal Grandfather    Family Psychiatric  History: See H&P Social History:  Social History   Substance and Sexual Activity  Alcohol Use No     Social History   Substance and Sexual Activity  Drug Use Yes   Types: Marijuana, Amphetamines   Comment: 07/18/15  "smoked off and on since age 72"    Social History   Socioeconomic History   Marital status: Legally Separated    Spouse name: Not on file   Number of children: 2   Years of education: 18   Highest education level: Not on file  Occupational History    Comment: home maker  Tobacco Use   Smoking status: Every Day    Packs/day: 1.00    Types: Cigarettes   Smokeless tobacco: Never  Vaping Use  Vaping Use: Never used  Substance and Sexual Activity   Alcohol use: No   Drug use: Yes    Types: Marijuana, Amphetamines    Comment: 07/18/15  "smoked off and on since age 24"   Sexual activity: Not Currently    Comment: Pt currently pregnant  Other Topics Concern   Not on file  Social History Narrative   Lives with mother and children    caffeine use- coffee 1 cup in morning   Social Determinants of Health   Financial Resource Strain: Not on file  Food Insecurity: Not on file  Transportation Needs: Not on file  Physical Activity: Not on file  Stress: Not on file  Social Connections: Not on file   Additional Social History:                          Sleep: Fair  Appetite:  Good  Current Medications: Current Facility-Administered Medications  Medication Dose Route Frequency Provider Last Rate Last Admin   acetaminophen (TYLENOL) tablet 650 mg  650 mg Oral Q6H PRN Mason Jim, Tyshon Fanning E, MD       alum & mag hydroxide-simeth (MAALOX/MYLANTA) 200-200-20 MG/5ML suspension 15 mL  15 mL Oral Q6H PRN Mason Jim, Jentry Mcqueary E, MD   15 mL at 11/29/20 1641   bisacodyl (DULCOLAX) EC tablet 5 mg  5 mg Oral Daily PRN Karsten Ro, MD   5 mg at 12/06/20 1715   docusate sodium (COLACE) capsule 100 mg  100 mg Oral Daily PRN Karsten Ro, MD   100 mg at 12/06/20 1715   hydrOXYzine (ATARAX/VISTARIL) tablet 25 mg  25 mg Oral TID PRN Comer Locket, MD   25 mg at 12/06/20 1715   magnesium hydroxide (MILK OF MAGNESIA) suspension 5 mL  5 mL Oral Daily PRN Comer Locket, MD   5 mL at 12/06/20 0948   mirtazapine (REMERON) tablet 15 mg  15 mg Oral QHS Doda, Vandana, MD       nicotine (NICODERM CQ - dosed in mg/24 hours) patch 21 mg  21 mg Transdermal Daily Mason Jim, Alverto Shedd E, MD   21 mg at 12/06/20 0737   polyethylene glycol (MIRALAX / GLYCOLAX) packet 17 g  17 g Oral Daily Mason Jim, Leeland Lovelady E, MD   17 g at 12/06/20 0738   QUEtiapine (SEROQUEL) tablet 200 mg  200 mg Oral QHS Doda, Vandana, MD       QUEtiapine (SEROQUEL) tablet 50 mg  50 mg Oral Daily Karsten Ro, MD   50 mg at 12/06/20 0737    Lab Results: No results found for this or any previous visit (from the past 48 hour(s)).  Blood Alcohol level:  Lab Results  Component Value Date   ETH <10 11/27/2020   ETH <5 07/10/2014    Metabolic Disorder Labs: Lab Results  Component Value Date   HGBA1C 5.3 11/29/2020   MPG 105.41 11/29/2020   MPG 117 05/27/2014   No results found for: PROLACTIN Lab Results  Component Value Date   CHOL 159 11/29/2020   TRIG 89 11/29/2020   HDL 39 (L) 11/29/2020   CHOLHDL 4.1 11/29/2020   VLDL 18 11/29/2020   LDLCALC 102 (H) 11/29/2020   LDLCALC 88  05/27/2014    Physical Findings: AIMS: Facial and Oral Movements Muscles of Facial Expression: None, normal Lips and Perioral Area: None, normal Jaw: None, normal Tongue: None, normal,Extremity Movements Upper (arms, wrists, hands, fingers): None, normal Lower (legs, knees, ankles, toes): None,  normal, Trunk Movements Neck, shoulders, hips: None, normal, Overall Severity Severity of abnormal movements (highest score from questions above): None, normal Incapacitation due to abnormal movements: None, normal Patient's awareness of abnormal movements (rate only patient's report): No Awareness, Dental Status Current problems with teeth and/or dentures?: No Does patient usually wear dentures?: No  CIWA:    COWS:     Musculoskeletal: Strength & Muscle Tone: within normal limits Gait & Station: normal Patient leans: N/A  Psychiatric Specialty Exam:  Presentation  General Appearance: Appropriate for Environment; Casual  Eye Contact:Good  Speech:Clear and Coherent  Speech Volume:Normal  Handedness:Right   Mood and Affect  Mood:Labile; Depressed; Anxious  Affect:Depressed; Tearful; Congruent; Labile   Thought Process  Thought Processes:Coherent  Descriptions of Associations:Intact  Orientation:Full (Time, Place and Person)  Thought Content:Rumination, denies AVH, paranoia, ideas of reference, or first rank symptoms; does not make delusional statements today  History of Schizophrenia/Schizoaffective disorder:No  Duration of Psychotic Symptoms:No data recorded Hallucinations:Hallucinations: None  Ideas of Reference:None  Suicidal Thoughts:Suicidal Thoughts: Yes, Active SI Active Intent and/or Plan: With Plan; Without Intent  Homicidal Thoughts:Homicidal Thoughts: No   Sensorium  Memory:Immediate Good; Recent Good; Remote Fair  Judgment:Poor  Insight:Shallow   Executive Functions  Concentration:Good  Attention Span:Good  Recall:Fair  Fund of  Knowledge:Good  Language:Good   Psychomotor Activity  Psychomotor Activity:Psychomotor Activity: Normal   Assets  Assets:Desire for Improvement; Resilience   Sleep  Sleep:Sleep: Fair Number of Hours of Sleep: 6   Physical Exam Constitutional:      Appearance: Normal appearance.  HENT:     Head: Normocephalic and atraumatic.  Pulmonary:     Effort: Pulmonary effort is normal.  Neurological:     General: No focal deficit present.     Mental Status: She is alert.   Review of Systems  Respiratory:  Negative for cough.   Cardiovascular:  Negative for chest pain.  Gastrointestinal:  Negative for abdominal pain.  Psychiatric/Behavioral:  Positive for suicidal ideas.   Blood pressure 114/78, pulse 82, temperature 98.1 F (36.7 C), temperature source Oral, resp. rate 18, height 5\' 5"  (1.651 m), weight 64.4 kg, last menstrual period 11/20/2020, SpO2 99 %. Body mass index is 23.63 kg/m.   Treatment Plan Summary: Daily contact with patient to assess and evaluate symptoms and progress in treatment [name] is a 40 yr old female who presents with worsening depression and SI. 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  Patient continues to report depression and anxiety.  Patient is actively suicidal with a plan of slitting her wrist.  She denies any intention.  She contracts for safety while on the unit.  We will continue to monitor her symptoms.   Bipolar Disorder, mixed Episode (r/out with psychotic features): -Increase Seroquel to 200 mg qhs and continue Seroquel 50mg  daily for residual mood lability  -EKG QTC 454, lipid profile shows cholesterol 159, LDL 102, triglycerides 89, HDL 39 and A1c 5.3  -Increase Remeron to 15 mg at bedtime for depression. -Patient will be referred to ACTT services after discharge.  -CSW provided information about list  of [facility].  -Repeat EKG ordered.  GAD by hx:  -Klonopin tapered and stopped.   ADHD by hx:  - Holding Vyvanse given concern for mixed manic episode and report of abuse of stimulants - medication verified in PDMP    Cannabis use - episodic (r/o cannabis use d/o) -  Counseled on need to abstain from substance use - UDS positive for THC  Tobacco use disorder - Continue nicotine patch 21 mg daily  Leukocytosis - Repeat CBC for trending   Constipation-improving:  -Continue Miralax daily -Continue Colace 100 mg as needed as needed for constipation. - Dulcolax suppository 10 mg daily as needed.   -Continue PRN's: Tylenol, Maalox, Atarax, Milk of Magnesia, Colace  PGY-2 Karsten Ro, MD 12/06/2020, 5:59 PM

## 2020-12-06 NOTE — [facility] Group Notes (Signed)
[facility] Group Notes:  (Nursing/MHT/Case Management/Adjunct)  Date:  12/06/2020  Time:  11:15 AM  Type of Therapy:  Psychoeducational Skills  Participation Level:  Active  Participation Quality:  Appropriate  Affect:  Appropriate  Cognitive:  Appropriate  Insight:  Appropriate  Engagement in Group:  Engaged  Modes of Intervention:  Education  Summary of Progress/Problems: Patient was able to identify relaxation as a way of proving herself with a way of feel better about herself. Educated patient on why nurturing is a necessity and provide relaxation technique to incorporate in to her life style.   Reymundo Poll 12/06/2020, 11:15 AM

## 2020-12-07 LAB — URINALYSIS, COMPLETE (UACMP) WITH MICROSCOPIC
Bilirubin Urine: NEGATIVE
Glucose, UA: NEGATIVE mg/dL
Hgb urine dipstick: NEGATIVE
Ketones, ur: NEGATIVE mg/dL
Leukocytes,Ua: NEGATIVE
Nitrite: NEGATIVE
Protein, ur: NEGATIVE mg/dL
Specific Gravity, Urine: 1.017 (ref 1.005–1.030)
pH: 5 (ref 5.0–8.0)

## 2020-12-07 LAB — CBC WITH DIFFERENTIAL/PLATELET
Abs Immature Granulocytes: 0.02 10*3/uL (ref 0.00–0.07)
Basophils Absolute: 0.1 10*3/uL (ref 0.0–0.1)
Basophils Relative: 1 %
Eosinophils Absolute: 0.1 10*3/uL (ref 0.0–0.5)
Eosinophils Relative: 2 %
HCT: 43.3 % (ref 36.0–46.0)
Hemoglobin: 14.1 g/dL (ref 12.0–15.0)
Immature Granulocytes: 0 %
Lymphocytes Relative: 29 %
Lymphs Abs: 2.4 10*3/uL (ref 0.7–4.0)
MCH: 31.3 pg (ref 26.0–34.0)
MCHC: 32.6 g/dL (ref 30.0–36.0)
MCV: 96.2 fL (ref 80.0–100.0)
Monocytes Absolute: 0.6 10*3/uL (ref 0.1–1.0)
Monocytes Relative: 8 %
Neutro Abs: 4.9 10*3/uL (ref 1.7–7.7)
Neutrophils Relative %: 60 %
Platelets: 257 10*3/uL (ref 150–400)
RBC: 4.5 MIL/uL (ref 3.87–5.11)
RDW: 12.8 % (ref 11.5–15.5)
WBC: 8.1 10*3/uL (ref 4.0–10.5)
nRBC: 0 % (ref 0.0–0.2)

## 2020-12-07 NOTE — Progress Notes (Signed)
Recreation Therapy Notes  Date: 11.9.22 Time: 0930-1000 Location: [address] Dayroom  Group Topic: Stress Management   Goal Area(s) Addresses:  Patient will actively participate in stress management techniques presented during session.  Patient will successfully identify benefit of practicing stress management post d/c.   Intervention: Relaxation exercise with ambient sound and script   Activity: Guided Imagery. LRT provided education, instruction, and demonstration on practice of visualization via guided imagery. Patient was asked to participate in the technique introduced during session. LRT debriefed including topics of mindfulness, stress management and specific scenarios each patient could use these techniques. Patients were given suggestions of ways to access scripts post d/c and encouraged to explore Youtube and other apps available on smartphones, tablets, and computers.  Education:  Stress Management, Discharge Planning.   Clinical Observations/Feedback: Group did not occur due to orientation group going over 20 minutes.  Packets were given with information on ways to manage stress, techniques that can be used, proper breathing techniques and how to properly do the techniques given such as progressive muscle relaxation, diaphragmatic breathing and mindfulness meditation.     Caroll Rancher, LRT, CTRS        Lillia Abed, Ricci Dirocco A 12/07/2020 12:02 PM

## 2020-12-07 NOTE — Progress Notes (Signed)
Adult Psychoeducational Group Note  Date:  12/07/2020 Time:  9:12 PM  Group Topic/Focus:  Wrap-Up Group:   The focus of this group is to help patients review their daily goal of treatment and discuss progress on daily workbooks.  Participation Level:  Active  Participation Quality:  Appropriate  Affect:  Appropriate  Cognitive:  Appropriate  Insight: Appropriate  Engagement in Group:  Engaged  Modes of Intervention:  Discussion  Additional Comments:  Patient said her day was a 8. Goal for today was to get into [facility] [place] [facility] [facility]. She achieved her goal. Goal for today get into the [facility]. He achieved his goal.  [name] Long 12/07/2020, 9:12 PM

## 2020-12-07 NOTE — Progress Notes (Signed)
Pt stated she was excited about getting accepted to [facility]    12/07/20 2200  Psych Admission Type (Psych Patients Only)  Admission Status Voluntary  Psychosocial Assessment  Patient Complaints Anxiety  Eye Contact Fair  Facial Expression Anxious  Affect Appropriate to circumstance  Speech Soft;Logical/coherent  Interaction Assertive  Motor Activity Restless;Pacing  Appearance/Hygiene Unremarkable  Behavior Characteristics Cooperative;Anxious  Mood Depressed;Anxious  Aggressive Behavior  Targets Self  Type of Behavior Verbal  Effect No apparent injury  Thought Process  Coherency Circumstantial  Content WDL  Delusions None reported or observed  Perception WDL  Hallucination None reported or observed  Judgment Impaired  Confusion None  Danger to Self  Current suicidal ideation? Passive  Self-Injurious Behavior No self-injurious ideation or behavior indicators observed or expressed   Agreement Not to Harm Self Yes  Description of Agreement Verbal contract  Danger to Others  Danger to Others None reported or observed

## 2020-12-07 NOTE — Progress Notes (Signed)
Adult Psychoeducational Group Note  Date:  12/07/2020 Time:  10:26 AM  Group Topic/Focus:  Goals Group:   The focus of this group is to help patients establish daily goals to achieve during treatment and discuss how the patient can incorporate goal setting into their daily lives to aide in recovery.  Participation Level:  Active  Participation Quality:  Appropriate  Affect:  Appropriate  Cognitive:  Appropriate  Insight: Appropriate  Engagement in Group:  Engaged  Modes of Intervention:  Discussion  Additional Comments:  Pt stated her goal for the day is to talk to someone from [facility].  Wynema Birch D 12/07/2020, 10:26 AM

## 2020-12-07 NOTE — Group Note (Addendum)
LCSW Group Therapy Note   Group Date: 12/07/2020 Start Time: 1300 End Time: 1400   Type of Therapy and Topic:  Group Therapy:   Participation Level:  Active  Description of Group:Group encouraged increased engagement and participation through discussion focused on Safety Planning. Patients worked both individually and collaboratively to create and discuss the different elements of a safety plan, including identifying warning signs, coping skills, professional supports, people you can ask for help, how to make the environment safe, and reasons for life worth living. Remainder of group was spent filling out individual safety plans to be placed in patient charts.   Therapeutic Goal(s): Identify warning signs and triggers Identify positive coping strategies Identify professional and personal supports when experiencing a mental health crisis Identify ways in which you can make the environment safe Identify reasons for life worth living Identify the steps to completing a safety plan and provide education on completing a safety plan at discharge   Summary of Patient Progress:  Pt came to group and introduced herself. Pt shared during introductions and stated that she feels most safe at her mother's home. Pt was appropriate during group and participated in discussion.   Felizardo Hoffmann, LCSWA 12/07/2020  1:36 PM

## 2020-12-07 NOTE — Progress Notes (Addendum)
[facility] MD Progress Note  12/07/2020 4:41 PM [name]  MRN:  [id number] Subjective:  [name] is a 40 yr old female who presents with worsening depression and SI. 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   On assessment today patient reports improvement in her depression and anxiety.  She states that she called [facility] and they told her to call back this evening at 7 PM.  Patient states that she is very anxious and really wants to get accepted to [facility].  She states she wants to get better.  Currently, she denies any active or passive suicidal ideation, homicidal ideation, auditory and visual hallucinations.  She states she will ask her mom if she can go there after discharge otherwise she is planning to go to her friend's house. She has been you using her coping skills such as walking, attending groups etc. She slept well last night and reports a good appetite.  She denies any other new symptoms.  She states her constipation is much better and she has had bowel movement.  She has been tolerating her medication without any side effects.   Principal Problem: Bipolar disorder, curr episode mixed, severe, w/o psychotic features (HCC) Diagnosis: Principal Problem:   Bipolar disorder, curr episode mixed, severe, w/o psychotic features (HCC)  Total Time spent with patient: 15 minutes  Past Psychiatric History: See H&P  Past Medical History:  Past Medical History:  Diagnosis Date   Abnormal Pap smear    ADHD (attention deficit hyperactivity disorder)    Anemia    generalized anxiety disorder   Anxiety    Asthma    Blood transfusion    Pt delivery in 2009   Complication of anesthesia    Headache(784.0)    PPH (postpartum hemorrhage)    Spinal headache     Past Surgical History:  Procedure Laterality Date   TONSILLECTOMY  2011   Family  History:  Family History  Problem Relation Age of Onset   Thrombophlebitis Mother    Hypertension Father    Heart disease Maternal Grandmother    Heart disease Paternal Grandfather    Hypertension Paternal Grandfather    Family Psychiatric  History: See H&P Social History:  Social History   Substance and Sexual Activity  Alcohol Use No     Social History   Substance and Sexual Activity  Drug Use Yes   Types: Marijuana, Amphetamines   Comment: 07/18/15  "smoked off and on since age 34"    Social History   Socioeconomic History   Marital status: Legally Separated    Spouse name: Not on file   Number of children: 2   Years of education: 18   Highest education level: Not on file  Occupational History    Comment: home maker  Tobacco Use   Smoking status: Every Day    Packs/day: 1.00    Types: Cigarettes   Smokeless tobacco: Never  Vaping Use   Vaping Use: Never used  Substance and Sexual Activity   Alcohol use: No   Drug use: Yes    Types: Marijuana, Amphetamines    Comment: 07/18/15  "smoked off and on since age 33"   Sexual activity: Not Currently    Comment: Pt currently pregnant  Other Topics Concern   Not on file  Social History Narrative   Lives with mother and children    caffeine use-  coffee 1 cup in morning   Social Determinants of Health   Financial Resource Strain: Not on file  Food Insecurity: Not on file  Transportation Needs: Not on file  Physical Activity: Not on file  Stress: Not on file  Social Connections: Not on file     Sleep: Good  Appetite:  Good  Current Medications: Current Facility-Administered Medications  Medication Dose Route Frequency Provider Last Rate Last Admin   acetaminophen (TYLENOL) tablet 650 mg  650 mg Oral Q6H PRN Mason Jim, Mikayla Chiusano E, MD       alum & mag hydroxide-simeth (MAALOX/MYLANTA) 200-200-20 MG/5ML suspension 15 mL  15 mL Oral Q6H PRN Mason Jim, Naomia Lenderman E, MD   15 mL at 11/29/20 1641   bisacodyl (DULCOLAX) EC  tablet 5 mg  5 mg Oral Daily PRN Karsten Ro, MD   5 mg at 12/07/20 0801   docusate sodium (COLACE) capsule 100 mg  100 mg Oral Daily PRN Karsten Ro, MD   100 mg at 12/06/20 1715   hydrOXYzine (ATARAX/VISTARIL) tablet 25 mg  25 mg Oral TID PRN Comer Locket, MD   25 mg at 12/07/20 0802   magnesium hydroxide (MILK OF MAGNESIA) suspension 5 mL  5 mL Oral Daily PRN Comer Locket, MD   5 mL at 12/06/20 0948   mirtazapine (REMERON) tablet 15 mg  15 mg Oral QHS Karsten Ro, MD   15 mg at 12/06/20 2113   nicotine (NICODERM CQ - dosed in mg/24 hours) patch 21 mg  21 mg Transdermal Daily Mason Jim, Lavayah Vita E, MD   21 mg at 12/07/20 0800   polyethylene glycol (MIRALAX / GLYCOLAX) packet 17 g  17 g Oral Daily Comer Locket, MD   17 g at 12/07/20 0804   QUEtiapine (SEROQUEL) tablet 200 mg  200 mg Oral QHS Karsten Ro, MD   200 mg at 12/06/20 2112   QUEtiapine (SEROQUEL) tablet 50 mg  50 mg Oral Daily Karsten Ro, MD   50 mg at 12/07/20 5188    Lab Results:  Results for orders placed or performed during the hospital encounter of 11/28/20 (from the past 48 hour(s))  Urinalysis, Complete w Microscopic Urine, Clean Catch     Status: Abnormal   Collection Time: 12/07/20  6:17 AM  Result Value Ref Range   Color, Urine YELLOW YELLOW   APPearance CLEAR CLEAR   Specific Gravity, Urine 1.017 1.005 - 1.030   pH 5.0 5.0 - 8.0   Glucose, UA NEGATIVE NEGATIVE mg/dL   Hgb urine dipstick NEGATIVE NEGATIVE   Bilirubin Urine NEGATIVE NEGATIVE   Ketones, ur NEGATIVE NEGATIVE mg/dL   Protein, ur NEGATIVE NEGATIVE mg/dL   Nitrite NEGATIVE NEGATIVE   Leukocytes,Ua NEGATIVE NEGATIVE   RBC / HPF 0-5 0 - 5 RBC/hpf   WBC, UA 0-5 0 - 5 WBC/hpf   Bacteria, UA RARE (A) NONE SEEN   Squamous Epithelial / LPF 0-5 0 - 5   Mucus PRESENT     Comment: Performed at [facility], [address], [place], [place] [address]  CBC with Differential/Platelet     Status: None   Collection Time: 12/07/20   6:35 AM  Result Value Ref Range   WBC 8.1 4.0 - 10.5 K/uL   RBC 4.50 3.87 - 5.11 MIL/uL   Hemoglobin 14.1 12.0 - 15.0 g/dL   HCT 63.0 16.0 - 10.9 %   MCV 96.2 80.0 - 100.0 fL   MCH 31.3 26.0 - 34.0 pg   MCHC  32.6 30.0 - 36.0 g/dL   RDW 18.5 63.1 - 49.7 %   Platelets 257 150 - 400 K/uL   nRBC 0.0 0.0 - 0.2 %   Neutrophils Relative % 60 %   Neutro Abs 4.9 1.7 - 7.7 K/uL   Lymphocytes Relative 29 %   Lymphs Abs 2.4 0.7 - 4.0 K/uL   Monocytes Relative 8 %   Monocytes Absolute 0.6 0.1 - 1.0 K/uL   Eosinophils Relative 2 %   Eosinophils Absolute 0.1 0.0 - 0.5 K/uL   Basophils Relative 1 %   Basophils Absolute 0.1 0.0 - 0.1 K/uL   Immature Granulocytes 0 %   Abs Immature Granulocytes 0.02 0.00 - 0.07 K/uL    Comment: Performed at [facility], [address] [place], [place] [address]    Blood Alcohol level:  Lab Results  Component Value Date   [facility] <10 11/27/2020   ETH <5 07/10/2014    Metabolic Disorder Labs: Lab Results  Component Value Date   HGBA1C 5.3 11/29/2020   MPG 105.41 11/29/2020   MPG 117 05/27/2014   No results found for: PROLACTIN Lab Results  Component Value Date   CHOL 159 11/29/2020   TRIG 89 11/29/2020   HDL 39 (L) 11/29/2020   CHOLHDL 4.1 11/29/2020   VLDL 18 11/29/2020   LDLCALC 102 (H) 11/29/2020   LDLCALC 88 05/27/2014    Physical Findings: AIMS: Facial and Oral Movements Muscles of Facial Expression: None, normal Lips and Perioral Area: None, normal Jaw: None, normal Tongue: None, normal,Extremity Movements Upper (arms, wrists, hands, fingers): None, normal Lower (legs, knees, ankles, toes): None, normal, Trunk Movements Neck, shoulders, hips: None, normal, Overall Severity Severity of abnormal movements (highest score from questions above): None, normal Incapacitation due to abnormal movements: None, normal Patient's awareness of abnormal movements (rate only patient's report): No Awareness, Dental Status Current  problems with teeth and/or dentures?: No Does patient usually wear dentures?: No     Musculoskeletal: Strength & Muscle Tone: within normal limits Gait & Station: normal Patient leans: N/A  Psychiatric Specialty Exam:  Presentation  General Appearance: Appropriate for Environment; Casual  Eye Contact:Good  Speech:Clear and Coherent  Speech Volume:Normal  Handedness:Right   Mood and Affect  Mood:anxious  Affect:anxious appearing but no longer labile or tearful   Thought Process  Thought Processes:Coherent, goal directed, linear  Descriptions of Associations:Intact  Orientation:Full (Time, Place and Person)  Thought Content:Rumination about [employer] Interview; denies AVH, paranoia, ideas of reference, or first rank symptoms; does not make delusional statements today  History of Schizophrenia/Schizoaffective disorder:No  Duration of Psychotic Symptoms:No data recorded Hallucinations:Hallucinations: None  Ideas of Reference:None  Suicidal Thoughts:Suicidal Thoughts: No   Homicidal Thoughts:Homicidal Thoughts: No   Sensorium  Memory:Immediate Good; Recent Good; Remote Fair  Judgment:Fair  Insight:Improving    Executive Functions  Concentration:Good  Attention Span:Good  Recall:Fair  Fund of Knowledge:Good  Language:Good   Psychomotor Activity  Psychomotor Activity:Psychomotor Activity: Normal   Assets  Assets:Desire for Improvement; Resilience   Sleep  Sleep:Sleep: Good Number of Hours of Sleep: 6.25   Physical Exam Vitals and nursing note reviewed.  Constitutional:      General: She is not in acute distress.    Appearance: Normal appearance. She is not toxic-appearing.  HENT:     Head: Normocephalic and atraumatic.  Pulmonary:     Effort: Pulmonary effort is normal.  Neurological:     General: No focal deficit present.     Mental Status: She  is alert.   Review of Systems  Respiratory:  Negative for cough.    Cardiovascular:  Negative for chest pain.  Gastrointestinal:  Negative for abdominal pain.  Psychiatric/Behavioral:  Negative for suicidal ideas.   Blood pressure 104/79, pulse 88, temperature 98.1 F (36.7 C), temperature source Oral, resp. rate 18, height 5\' 5"  (1.651 m), weight 64.4 kg, last menstrual period 11/20/2020, SpO2 98 %. Body mass index is 23.63 kg/m.   Treatment Plan Summary: Daily contact with patient to assess and evaluate symptoms and progress in treatment [name] is a 40 yr old female who presents with worsening depression and SI. 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  Patient reports improvement in her depression and anxiety.  She denies SI, HI, AVH.  We will continue to monitor her symptoms.   Bipolar Disorder, mixed Episode: -Continue Seroquel 200 mg qhs and continue Seroquel 50mg  daily for residual mood lability  -Repeat EKG QTC , lipid profile shows cholesterol 159, LDL 102, triglycerides 89, HDL 39 and A1c 5.3  -Continue Remeron 15 mg at bedtime for depression. -Patient will be referred to ACTT services after discharge.    GAD by hx:  -Klonopin tapered and stopped.   ADHD by hx:  - Holding Vyvanse given concern for mixed manic episode and report of abuse of stimulants -    Cannabis use - episodic (r/o cannabis use d/o) - Counseled on need to abstain from substance use - UDS positive for [facility] -CSW provided information about list of [facility] for time of discharge and has interview tonight  Tobacco use disorder - Continue nicotine patch 21 mg daily  Leukocytosis - resolved - Repeat CBC Shows normal WBC 8.1 on 12/07/20  Abnormal UA - Repeat clean catch shows no further Hbg or RBC with only rare bacteria - patient currently asymptomatic   Constipation-improving:  -Continue Miralax daily -Continue Colace 100 mg  as needed as needed for constipation. - Dulcolax suppository 10 mg daily as needed.   -Continue PRN's: Tylenol, Maalox, Atarax, Milk of Magnesia, Colace  PGY-2 Karsten Ro, MD 12/07/2020, 4:41 PM

## 2020-12-07 NOTE — Progress Notes (Signed)
Pt given urine cup and educated on how to give sample and the importance of retuning sample to staff

## 2020-12-07 NOTE — Progress Notes (Addendum)
   12/07/20 1400  Psych Admission Type (Psych Patients Only)  Admission Status Voluntary  Psychosocial Assessment  Patient Complaints Anxiety  Eye Contact Fair  Facial Expression Anxious  Affect Appropriate to circumstance  Speech Soft;Logical/coherent  Interaction Assertive  Motor Activity Restless;Pacing  Appearance/Hygiene Unremarkable  Behavior Characteristics Cooperative;Anxious  Aggressive Behavior  Targets Self  Type of Behavior Verbal  Effect No apparent injury  Thought Process  Coherency Circumstantial  Content WDL  Delusions None reported or observed  Perception WDL  Hallucination None reported or observed  Judgment Impaired  Confusion None  Danger to Self  Current suicidal ideation? Passive  Self-Injurious Behavior No self-injurious ideation or behavior indicators observed or expressed   Agreement Not to Harm Self Yes  Description of Agreement Verbal contract  Danger to Others  Danger to Others None reported or observed   D. Pt presents with an anxious affect/ mood, brightens during interactions. Per pt's self inventory, pt rated her depression, hopelessness and anxiety a 5/6/8, respectively. Pt has been visible in the milieu interacting well with peers and  observed attending groups.  Pt currently denies SI/HI and AVH  A. Labs and vitals monitored. Pt given and educated on medications. Pt supported emotionally and encouraged to express concerns and ask questions.   R. Pt remains safe with 15 minute checks. Will continue POC.

## 2020-12-08 MED ORDER — DOCUSATE SODIUM 100 MG PO CAPS
100.0000 mg | ORAL_CAPSULE | Freq: Every day | ORAL | 0 refills | Status: AC | PRN
Start: 2020-12-08 — End: ?

## 2020-12-08 MED ORDER — QUETIAPINE FUMARATE 200 MG PO TABS: 200.0000 mg | ORAL_TABLET | Freq: Every day | ORAL | 0 refills | Status: AC

## 2020-12-08 MED ORDER — MIRTAZAPINE 15 MG PO TABS
15.0000 mg | ORAL_TABLET | Freq: Every day | ORAL | 0 refills | Status: AC
Start: 2020-12-08 — End: ?

## 2020-12-08 MED ORDER — NICOTINE 21 MG/24HR TD PT24: 21.0000 mg | MEDICATED_PATCH | Freq: Every day | TRANSDERMAL | 0 refills | Status: AC

## 2020-12-08 MED ORDER — HYDROXYZINE HCL 25 MG PO TABS
25.0000 mg | ORAL_TABLET | Freq: Three times a day (TID) | ORAL | 0 refills | Status: AC | PRN
Start: 2020-12-08 — End: ?

## 2020-12-08 MED ORDER — POLYETHYLENE GLYCOL 3350 17 G PO PACK
17.0000 g | PACK | Freq: Every day | ORAL | 0 refills | Status: AC
Start: 2020-12-08 — End: ?

## 2020-12-08 MED ORDER — QUETIAPINE FUMARATE 50 MG PO TABS: 50.0000 mg | ORAL_TABLET | Freq: Every day | ORAL | 0 refills | Status: AC

## 2020-12-08 NOTE — [facility] Counselor (Signed)
CSW spoke with the Pt who states that she will be living at the [facility] in [place], [phone].  The Pt states that her mother will be taking her to this [employer] today.

## 2020-12-08 NOTE — Progress Notes (Signed)
  [facility] Adult Case Management Discharge Plan :  Will you be returning to the same living situation after discharge:  No. [facility] in [place] At discharge, do you have transportation home?: Yes,  Mother  Do you have the ability to pay for your medications: Yes,  Medicaid  Release of information consent forms completed and in the chart;  Patient's signature needed at discharge.  Patient to Follow up at:  Follow-up Information     [facility]. Go on 12/13/2020.   Why: Please go to this provider for substance abuse intensive outpatient therapy services, during walk in hours:  Monday through Friday, from 6:00 am to 10:30 am. Contact information: [address] [place], [place] [address]  P:  [phone] F:  [phone]        [facility] Follow up on 12/12/2020.   Why: You are scheduled for an Intake to begin ACTT services on 12/12/2020 at 12:30pm.  This appointment is in person at the [facility] office with Dr. Guss Bunde. Contact information: [address] [place] [address] [phone]                 Next level of care provider has access to [facility] Link:no  Safety Planning and Suicide Prevention discussed: Yes,  with mother and patient      Has patient been referred to the Quitline?: Patient refused referral  Patient has been referred for addiction treatment: Yes  Aram Beecham, LCSWA 12/08/2020, 10:17 AM
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
## 2020-12-08 NOTE — Progress Notes (Signed)
Patient ID: [name], female   DOB: [birth year], 40 y.o.   MRN: [id number] Nursing discharge note: Patient discharged home per MD order.  Patient received all personal belongings from unit and locker.  Reviewed AVS/transition record with patient and she indicates understanding.  Patient will follow up with [facility].  Patient denies any thoughts of self harm.  She left ambulatory with her mother.

## 2020-12-08 NOTE — BHH Group Notes (Signed)
Pt did not attend group. 

## 2020-12-08 NOTE — Plan of Care (Signed)
Patient discharged home per MD order. Patient left ambulatory with her mother. She has all follow up appointments in place. She denies any thoughts of self harm. She received all belongings from locker and room.

## 2020-12-08 NOTE — BHH Counselor (Signed)
CSW spoke with [name] at [facility] who states that the Pt is scheduled for an Intake appointment on 12/12/2020 at 12:30pm to re-start her ACTT services with Dr. Guss Bunde.

## 2020-12-16 ENCOUNTER — Emergency Department (HOSPITAL_COMMUNITY): Payer: Medicaid Other

## 2020-12-16 ENCOUNTER — Emergency Department (HOSPITAL_COMMUNITY)
Admission: EM | Admit: 2020-12-16 | Discharge: 2020-12-16 | Disposition: A | Discharge status: Home / Self Care | Payer: Medicaid Other | Attending: Emergency Medicine | Admitting: Emergency Medicine

## 2020-12-16 ENCOUNTER — Other Ambulatory Visit: Payer: Self-pay

## 2020-12-16 ENCOUNTER — Encounter (HOSPITAL_COMMUNITY): Payer: Self-pay

## 2020-12-16 DIAGNOSIS — F17210 Nicotine dependence, cigarettes, uncomplicated: Secondary | ICD-10-CM | POA: Insufficient documentation

## 2020-12-16 DIAGNOSIS — J4520 Mild intermittent asthma, uncomplicated: Secondary | ICD-10-CM | POA: Diagnosis not present

## 2020-12-16 DIAGNOSIS — R1084 Generalized abdominal pain: Secondary | ICD-10-CM | POA: Diagnosis not present

## 2020-12-16 DIAGNOSIS — R197 Diarrhea, unspecified: Secondary | ICD-10-CM | POA: Diagnosis not present

## 2020-12-16 DIAGNOSIS — R112 Nausea with vomiting, unspecified: Secondary | ICD-10-CM | POA: Diagnosis present

## 2020-12-16 LAB — LIPASE, BLOOD: Lipase: 30 U/L (ref 11–51)

## 2020-12-16 LAB — CBC
HCT: 45.2 % (ref 36.0–46.0)
Hemoglobin: 15.1 g/dL — ABNORMAL HIGH (ref 12.0–15.0)
MCH: 31.3 pg (ref 26.0–34.0)
MCHC: 33.4 g/dL (ref 30.0–36.0)
MCV: 93.6 fL (ref 80.0–100.0)
Platelets: 225 10*3/uL (ref 150–400)
RBC: 4.83 MIL/uL (ref 3.87–5.11)
RDW: 12.5 % (ref 11.5–15.5)
WBC: 9.4 10*3/uL (ref 4.0–10.5)
nRBC: 0 % (ref 0.0–0.2)

## 2020-12-16 LAB — COMPREHENSIVE METABOLIC PANEL
ALT: 20 U/L (ref 0–44)
AST: 14 U/L — ABNORMAL LOW (ref 15–41)
Albumin: 3.8 g/dL (ref 3.5–5.0)
Alkaline Phosphatase: 64 U/L (ref 38–126)
Anion gap: 6 (ref 5–15)
BUN: 15 mg/dL (ref 6–20)
CO2: 27 mmol/L (ref 22–32)
Calcium: 8.8 mg/dL — ABNORMAL LOW (ref 8.9–10.3)
Chloride: 101 mmol/L (ref 98–111)
Creatinine, Ser: 0.65 mg/dL (ref 0.44–1.00)
GFR, Estimated: >60 mL/min (ref >60)
Glucose, Bld: 89 mg/dL (ref 70–99)
Potassium: 3.1 mmol/L — ABNORMAL LOW (ref 3.5–5.1)
Sodium: 134 mmol/L — ABNORMAL LOW (ref 135–145)
Total Bilirubin: 0.5 mg/dL (ref 0.3–1.2)
Total Protein: 7.4 g/dL (ref 6.5–8.1)

## 2020-12-16 LAB — URINALYSIS, ROUTINE W REFLEX MICROSCOPIC
Bilirubin Urine: NEGATIVE
Glucose, UA: NEGATIVE mg/dL
Hgb urine dipstick: NEGATIVE
Ketones, ur: NEGATIVE mg/dL
Leukocytes,Ua: NEGATIVE
Nitrite: NEGATIVE
Protein, ur: NEGATIVE mg/dL
Specific Gravity, Urine: 1.028 (ref 1.005–1.030)
pH: 5 (ref 5.0–8.0)

## 2020-12-16 LAB — I-STAT BETA HCG BLOOD, ED (MC, WL, AP ONLY): I-stat hCG, quantitative: <5 m[IU]/mL (ref <5)

## 2020-12-16 MED ORDER — SODIUM CHLORIDE 0.9 % IV BOLUS
1000.0000 mL | Freq: Once | INTRAVENOUS | Status: AC
  Administered 2020-12-16: 1000 mL via INTRAVENOUS

## 2020-12-16 MED ORDER — ALUM & MAG HYDROXIDE-SIMETH 200-200-20 MG/5ML PO SUSP
30.0000 mL | Freq: Once | ORAL | Status: AC
  Administered 2020-12-16: 30 mL via ORAL
  Filled 2020-12-16: qty 30

## 2020-12-16 MED ORDER — SUCRALFATE 1 G PO TABS: 1.0000 g | ORAL_TABLET | Freq: Three times a day (TID) | ORAL | 0 refills | Status: AC

## 2020-12-16 MED ORDER — PANTOPRAZOLE SODIUM 20 MG PO TBEC: 20.0000 mg | DELAYED_RELEASE_TABLET | Freq: Every day | ORAL | 0 refills | Status: AC

## 2020-12-16 MED ORDER — ONDANSETRON HCL 4 MG PO TABS: 4.0000 mg | ORAL_TABLET | Freq: Four times a day (QID) | ORAL | 0 refills | Status: AC

## 2020-12-16 MED ORDER — LIDOCAINE VISCOUS HCL 2 % MT SOLN
15.0000 mL | Freq: Once | OROMUCOSAL | Status: AC
  Administered 2020-12-16: 15 mL via ORAL
  Filled 2020-12-16: qty 15

## 2020-12-16 MED ORDER — IOHEXOL 350 MG/ML SOLN
80.0000 mL | Freq: Once | INTRAVENOUS | Status: AC | PRN
  Administered 2020-12-16: 80 mL via INTRAVENOUS

## 2020-12-16 MED ORDER — ONDANSETRON HCL 4 MG/2ML IJ SOLN
4.0000 mg | Freq: Once | INTRAMUSCULAR | Status: AC
  Administered 2020-12-16: 4 mg via INTRAVENOUS
  Filled 2020-12-16: qty 2

## 2020-12-16 NOTE — ED Provider Notes (Signed)
[facility]-EMERGENCY DEPT Provider Note   CSN: [id number] Arrival date & time: 12/16/20  1123     History Chief Complaint  Patient presents with   Abdominal Pain   Emesis    [name] is a 40 y.o. female.  The history is provided by the patient.  Abdominal Pain Pain location:  Generalized Pain quality: aching   Pain radiates to:  Does not radiate Pain severity:  Mild Onset quality:  Gradual Duration:  3 days Timing:  Intermittent Progression:  Waxing and waning Chronicity:  New Context: not sick contacts and not suspicious food intake   Relieved by:  Nothing Worsened by:  Nothing Associated symptoms: diarrhea, nausea and vomiting   Associated symptoms: no chest pain, no chills, no cough, no dysuria, no fever, no hematuria, no shortness of breath and no sore throat   Emesis Associated symptoms: abdominal pain and diarrhea   Associated symptoms: no arthralgias, no chills, no cough, no fever and no sore throat       Past Medical History:  Diagnosis Date   Abnormal Pap smear    ADHD (attention deficit hyperactivity disorder)    Anemia    generalized anxiety disorder   Anxiety    Asthma    Blood transfusion    Pt delivery in 2009   Complication of anesthesia    Headache(784.0)    PPH (postpartum hemorrhage)    Spinal headache     Patient Active Problem List   Diagnosis Date Noted   MDD (major depressive disorder), severe (HCC) 11/28/2020   Dental caries 07/21/2018   Lower abdominal pain 09/10/2017   Bacterial vaginosis 07/02/2016   Health care maintenance 07/02/2016   Contraception management 04/05/2016   Asymmetrical sensorineural hearing loss 01/25/2016   Frequent No-show for appointment 01/16/2016   Conductive hearing loss of left ear with unrestricted hearing of right ear 07/05/2015   Chronic otitis media of left ear 05/09/2015   Perforated left tympanic membrane on examination 05/09/2015   Bruising 05/06/2015   De  Quervain's tenosynovitis 04/15/2015   Right knee pain 04/15/2015   History of perforated ear drum 04/15/2015   Asthma with acute exacerbation 09/14/2014   Skin lesion of breast 09/14/2014   Thyroid nodule 09/14/2014   Rectal bleeding 09/14/2014   Bipolar disorder, curr episode mixed, severe, w/o psychotic features (HCC) 07/11/2014   Cannabis use disorder, moderate, dependence (HCC) 07/11/2014   Overweight (BMI 25.0-29.9) 10/12/2013   Menstrual cramp 03/24/2012   Asthma 06/28/2010   Mild intermittent asthma without complication 06/28/2010   ADHD 06/13/2007   ADHD 06/13/2007    Past Surgical History:  Procedure Laterality Date   TONSILLECTOMY  2011     OB History     Gravida  3   Para  2   Term  2   Preterm      AB  1   Living  2      SAB      IAB      Ectopic  1   Multiple      Live Births  2           Family History  Problem Relation Age of Onset   Thrombophlebitis Mother    Hypertension Father    Heart disease Maternal Grandmother    Heart disease Paternal Grandfather    Hypertension Paternal Grandfather     Social History   Tobacco Use   Smoking status: Every Day    Packs/day: 1.00  Types: Cigarettes   Smokeless tobacco: Never  Vaping Use   Vaping Use: Never used  Substance Use Topics   Alcohol use: No   Drug use: Yes    Types: Marijuana    Home Medications Prior to Admission medications   Medication Sig Start Date End Date Taking? Authorizing Provider  ondansetron (ZOFRAN) 4 MG tablet Take 1 tablet (4 mg total) by mouth every 6 (six) hours. 12/16/20  Yes Dillinger Aston, DO  pantoprazole (PROTONIX) 20 MG tablet Take 1 tablet (20 mg total) by mouth daily. 12/16/20 01/15/21 Yes Cline Draheim, DO  sucralfate (CARAFATE) 1 g tablet Take 1 tablet (1 g total) by mouth 4 (four) times daily -  with meals and at bedtime for 14 days. 12/16/20 12/30/20 Yes Marylu Dudenhoeffer, DO  docusate sodium (COLACE) 100 MG capsule Take 1 capsule (100 mg  total) by mouth daily as needed. For constipation 12/08/20   Lindell Spar I, NP  hydrOXYzine (ATARAX/VISTARIL) 25 MG tablet Take 1 tablet (25 mg total) by mouth 3 (three) times daily as needed for anxiety. 12/08/20   Lindell Spar I, NP  mirtazapine (REMERON) 15 MG tablet Take 1 tablet (15 mg total) by mouth at bedtime. For depression/sleep 12/08/20   Lindell Spar I, NP  nicotine (NICODERM CQ - DOSED IN MG/24 HOURS) 21 mg/24hr patch Place 1 patch (21 mg total) onto the skin daily. (May buy from over the counter): For smoking cessation 12/09/20   Lindell Spar I, NP  polyethylene glycol (MIRALAX / GLYCOLAX) 17 g packet Take 17 g by mouth daily. For constipation 12/08/20   Lindell Spar I, NP  QUEtiapine (SEROQUEL) 200 MG tablet Take 1 tablet (200 mg total) by mouth at bedtime. For mood control 12/08/20   Lindell Spar I, NP  QUEtiapine (SEROQUEL) 50 MG tablet Take 1 tablet (50 mg total) by mouth daily. For agitation 12/09/20   Lindell Spar I, NP    Allergies    Codeine, Gadolinium derivatives, Haldol [haloperidol], Lamictal [lamotrigine], Amoxicillin, Keflex [cephalexin], and Penicillins  Review of Systems   Review of Systems  Constitutional:  Negative for chills and fever.  HENT:  Negative for ear pain and sore throat.   Eyes:  Negative for pain and visual disturbance.  Respiratory:  Negative for cough and shortness of breath.   Cardiovascular:  Negative for chest pain and palpitations.  Gastrointestinal:  Positive for abdominal pain, diarrhea, nausea and vomiting.  Genitourinary:  Negative for dysuria and hematuria.  Musculoskeletal:  Negative for arthralgias and back pain.  Skin:  Negative for color change and rash.  Neurological:  Negative for seizures and syncope.  All other systems reviewed and are negative.  Physical Exam Updated Vital Signs BP (!) 113/93 (BP Location: Left Arm)   Pulse 94   Temp 97.7 F (36.5 C) (Oral)   Resp 18   Ht 5\' 5"  (1.651 m)   Wt 68 kg   LMP 11/20/2020  (Approximate)   SpO2 100%   BMI 24.96 kg/m   Physical Exam Vitals and nursing note reviewed.  Constitutional:      General: She is not in acute distress.    Appearance: She is well-developed. She is not ill-appearing.  HENT:     Head: Normocephalic and atraumatic.     Mouth/Throat:     Mouth: Mucous membranes are moist.  Eyes:     Extraocular Movements: Extraocular movements intact.     Conjunctiva/sclera: Conjunctivae normal.     Pupils: Pupils are equal, round, and reactive  to light.  Cardiovascular:     Rate and Rhythm: Normal rate and regular rhythm.     Heart sounds: Normal heart sounds. No murmur heard. Pulmonary:     Effort: Pulmonary effort is normal. No respiratory distress.     Breath sounds: Normal breath sounds.  Abdominal:     General: Abdomen is flat.     Palpations: Abdomen is soft.     Tenderness: There is no abdominal tenderness.     Hernia: No hernia is present.  Musculoskeletal:        General: No swelling.     Cervical back: Neck supple.  Skin:    General: Skin is warm and dry.     Capillary Refill: Capillary refill takes less than 2 seconds.  Neurological:     Mental Status: She is alert.  Psychiatric:        Mood and Affect: Mood normal.    ED Results / Procedures / Treatments   Labs (all labs ordered are listed, but only abnormal results are displayed) Labs Reviewed  COMPREHENSIVE METABOLIC PANEL - Abnormal; Notable for the following components:      Result Value   Sodium 134 (*)    Potassium 3.1 (*)    Calcium 8.8 (*)    AST 14 (*)    All other components within normal limits  CBC - Abnormal; Notable for the following components:   Hemoglobin 15.1 (*)    All other components within normal limits  URINALYSIS, ROUTINE W REFLEX MICROSCOPIC - Abnormal; Notable for the following components:   APPearance HAZY (*)    All other components within normal limits  C DIFFICILE QUICK SCREEN W PCR REFLEX    GASTROINTESTINAL PANEL BY PCR, STOOL  (REPLACES STOOL CULTURE)  LIPASE, BLOOD  I-STAT BETA HCG BLOOD, ED ([facility], [facility], [facility] ONLY)    EKG None  Radiology CT ABDOMEN PELVIS W CONTRAST  Result Date: 12/16/2020 CLINICAL DATA:  Bowel obstruction suspected. Upper abdominal bloating. Burping. Diarrhea. EXAM: CT ABDOMEN AND PELVIS WITH CONTRAST TECHNIQUE: Multidetector CT imaging of the abdomen and pelvis was performed using the standard protocol following bolus administration of intravenous contrast. CONTRAST:  66mL OMNIPAQUE IOHEXOL 350 MG/ML SOLN COMPARISON:  None. FINDINGS: Lower chest: No acute abnormality. Hepatobiliary: No focal liver abnormality is seen. No gallstones, gallbladder wall thickening, or biliary dilatation. Pancreas: Unremarkable. No pancreatic ductal dilatation or surrounding inflammatory changes. Spleen: Normal in size without focal abnormality. Adrenals/Urinary Tract: Adrenal glands are unremarkable. Kidneys are normal, without renal calculi, focal lesion, or hydronephrosis. Bladder is unremarkable. Stomach/Bowel: The stomach is mildly distended without evidence of outlet obstruction. The small bowel is normal in caliber with no abnormality. No small bowel obstruction. Mild fluid in the right side of the colon without wall thickening consistent with history of diarrhea. No evidence of obstruction. The appendix is normal. Vascular/Lymphatic: No significant vascular findings are present. No enlarged abdominal or pelvic lymph nodes. Reproductive: There is a probable corpus luteum cyst in the right ovary. The uterus and ovaries are otherwise normal. Other: No abdominal wall hernia or abnormality. No abdominopelvic ascites. Musculoskeletal: No acute or significant osseous findings. IMPRESSION: 1. Mild gaseous distention without evidence of outlet obstruction. 2. Fluid in the right colon consistent with history of diarrhea. No wall thickening to suggest colitis. 3. No bowel obstruction.  The appendix is normal. 4. Corpus luteum cyst in  the right ovary. 5. No other abnormalities. Electronically Signed   By: Gerome Sam III M.D.   On: 12/16/2020  15:19    Procedures Procedures   Medications Ordered in ED Medications  iohexol (OMNIPAQUE) 350 MG/ML injection 80 mL (80 mLs Intravenous Contrast Given 12/16/20 1448)  sodium chloride 0.9 % bolus 1,000 mL (1,000 mLs Intravenous New Bag/Given 12/16/20 1714)  ondansetron (ZOFRAN) injection 4 mg (4 mg Intravenous Given 12/16/20 1714)  alum & mag hydroxide-simeth (MAALOX/MYLANTA) 200-200-20 MG/5ML suspension 30 mL (30 mLs Oral Given 12/16/20 1726)    And  lidocaine (XYLOCAINE) 2 % viscous mouth solution 15 mL (15 mLs Oral Given 12/16/20 1726)    ED Course  I have reviewed the triage vital signs and the nursing notes.  Pertinent labs & imaging results that were available during my care of the patient were reviewed by me and considered in my medical decision making (see chart for details).    MDM Rules/Calculators/A&P                           [name] is here for diarrhea and nausea and vomiting.  Symptoms for the last 3 days.  History of the same.  No suspicious food intake.  Did take some antibiotics several weeks ago.  Patient with normal vitals.  No fever.  Lab work and CT imaging done prior to my evaluation.  Lab is overall unremarkable.  No significant anemia, electrolyte ab Mody, kidney injury.  Pregnancy test negative.  Urinalysis negative.  CT scan shows no obvious bowel obstruction.  There is some mild gaseous distention of the stomach.  Fluid within the right colon consistent with diarrhea but no obvious colitis.  Not sure if this is a foodborne illness or irritable bowel process.  She did have recent antibiotics and could be C. difficile for some other GI pathogen.  She is given IV fluids and IV Zofran and eloped prior to reevaluation.  She was unable to give stool sample.  This chart was dictated using voice recognition software.  Despite best efforts to  proofread,  errors can occur which can change the documentation meaning.   Final Clinical Impression(s) / ED Diagnoses Final diagnoses:  Nausea vomiting and diarrhea    Rx / DC Orders ED Discharge Orders          Ordered    ondansetron (ZOFRAN) 4 MG tablet  Every 6 hours        12/16/20 1755    pantoprazole (PROTONIX) 20 MG tablet  Daily        12/16/20 1755    sucralfate (CARAFATE) 1 g tablet  3 times daily with meals & bedtime        12/16/20 [place], [place], DO 12/16/20 1759

## 2020-12-16 NOTE — ED Provider Notes (Signed)
Emergency Medicine Provider Triage Evaluation Note  [name] , a 40 y.o. female  was evaluated in triage.  Pt complains of abdominal pain and nausea/vomiting.  This has been ongoing for two days.  She thinks that she is vomiting feces based on the odor and consistency of vomit.  She reports abdominal pain diffusely.  She went to urgent care and was referred here.    She had a UTI and feels like that has been better.   Review of Systems  Positive: Abdominal pain, N/V, constipation Negative: Fevers syncope  Physical Exam  BP (!) 133/95 (BP Location: Right Arm)   Pulse (!) 103   Temp 97.7 F (36.5 C) (Oral)   Resp 16   Ht 5\' 5"  (1.651 m)   Wt 68 kg   LMP 11/20/2020 (Approximate)   SpO2 100%   BMI 24.96 kg/m  Gen:   Awake, no distress  Resp:  Normal effort  MSK:   Moves extremities without difficulty  Other:  Abdomen is TTP primarily in the epigastric region.   Medical Decision Making  Medically screening exam initiated at 1:46 PM.  Appropriate orders placed.  [name] was informed that the remainder of the evaluation will be completed by another provider, this initial triage assessment does not replace that evaluation, and the importance of remaining in the ED until their evaluation is complete.  Note: Portions of this report may have been transcribed using voice recognition software. Every effort was made to ensure accuracy; however, inadvertent computerized transcription errors may be present    Ezekiel Slocumb, PA-C 12/16/20 1356    12/18/20, MD 12/16/20 1544

## 2020-12-16 NOTE — ED Triage Notes (Signed)
Patient reports that she has been having upper abdominal bloating, emesis, "burping."and diarrhea. Patient reports that her emesis and diarrhea were green in color and smelled like "rotten eggs."  Patient went to an UC this AM and was referred to the ED for further evaluation.

## 2021-11-29 ENCOUNTER — Emergency Department (HOSPITAL_COMMUNITY)
Admission: EM | Admit: 2021-11-29 | Discharge: 2021-11-29 | Disposition: A | Discharge status: Home / Self Care | Payer: Medicaid Other | Attending: Emergency Medicine | Admitting: Emergency Medicine

## 2021-11-29 ENCOUNTER — Emergency Department (HOSPITAL_COMMUNITY): Payer: Medicaid Other

## 2021-11-29 ENCOUNTER — Encounter (HOSPITAL_COMMUNITY): Payer: Self-pay | Admitting: Emergency Medicine

## 2021-11-29 ENCOUNTER — Other Ambulatory Visit: Payer: Self-pay

## 2021-11-29 DIAGNOSIS — R0602 Shortness of breath: Secondary | ICD-10-CM | POA: Insufficient documentation

## 2021-11-29 DIAGNOSIS — T782XXA Anaphylactic shock, unspecified, initial encounter: Secondary | ICD-10-CM | POA: Diagnosis present

## 2021-11-29 DIAGNOSIS — R1084 Generalized abdominal pain: Secondary | ICD-10-CM | POA: Insufficient documentation

## 2021-11-29 DIAGNOSIS — R000 Tachycardia, unspecified: Secondary | ICD-10-CM | POA: Insufficient documentation

## 2021-11-29 DIAGNOSIS — Z1152 Encounter for screening for COVID-19: Secondary | ICD-10-CM | POA: Diagnosis not present

## 2021-11-29 DIAGNOSIS — Z7951 Long term (current) use of inhaled steroids: Secondary | ICD-10-CM | POA: Insufficient documentation

## 2021-11-29 DIAGNOSIS — E876 Hypokalemia: Secondary | ICD-10-CM | POA: Diagnosis not present

## 2021-11-29 DIAGNOSIS — J45909 Unspecified asthma, uncomplicated: Secondary | ICD-10-CM | POA: Insufficient documentation

## 2021-11-29 LAB — CBC WITH DIFFERENTIAL/PLATELET
Abs Immature Granulocytes: 0.14 10*3/uL — ABNORMAL HIGH (ref 0.00–0.07)
Basophils Absolute: 0 10*3/uL (ref 0.0–0.1)
Basophils Relative: 0 %
Eosinophils Absolute: 0.3 10*3/uL (ref 0.0–0.5)
Eosinophils Relative: 1 %
HCT: 55.5 % — ABNORMAL HIGH (ref 36.0–46.0)
Hemoglobin: 18.9 g/dL — ABNORMAL HIGH (ref 12.0–15.0)
Immature Granulocytes: 1 %
Lymphocytes Relative: 23 %
Lymphs Abs: 4.5 10*3/uL — ABNORMAL HIGH (ref 0.7–4.0)
MCH: 31.1 pg (ref 26.0–34.0)
MCHC: 34.1 g/dL (ref 30.0–36.0)
MCV: 91.3 fL (ref 80.0–100.0)
Monocytes Absolute: 1 10*3/uL (ref 0.1–1.0)
Monocytes Relative: 5 %
Neutro Abs: 13.5 10*3/uL — ABNORMAL HIGH (ref 1.7–7.7)
Neutrophils Relative %: 70 %
Platelets: 357 10*3/uL (ref 150–400)
RBC: 6.08 MIL/uL — ABNORMAL HIGH (ref 3.87–5.11)
RDW: 12.4 % (ref 11.5–15.5)
WBC: 19.5 10*3/uL — ABNORMAL HIGH (ref 4.0–10.5)
nRBC: 0 % (ref 0.0–0.2)

## 2021-11-29 LAB — COMPREHENSIVE METABOLIC PANEL
ALT: 13 U/L (ref 0–44)
AST: 14 U/L — ABNORMAL LOW (ref 15–41)
Albumin: 3.8 g/dL (ref 3.5–5.0)
Alkaline Phosphatase: 79 U/L (ref 38–126)
Anion gap: 12 (ref 5–15)
BUN: 19 mg/dL (ref 6–20)
CO2: 19 mmol/L — ABNORMAL LOW (ref 22–32)
Calcium: 8.6 mg/dL — ABNORMAL LOW (ref 8.9–10.3)
Chloride: 105 mmol/L (ref 98–111)
Creatinine, Ser: 0.85 mg/dL (ref 0.44–1.00)
GFR, Estimated: >60 mL/min (ref >60)
Glucose, Bld: 216 mg/dL — ABNORMAL HIGH (ref 70–99)
Potassium: 2.8 mmol/L — ABNORMAL LOW (ref 3.5–5.1)
Sodium: 136 mmol/L (ref 135–145)
Total Bilirubin: 0.8 mg/dL (ref 0.3–1.2)
Total Protein: 7.1 g/dL (ref 6.5–8.1)

## 2021-11-29 LAB — I-STAT BETA HCG BLOOD, ED (MC, WL, AP ONLY): I-stat hCG, quantitative: <5 m[IU]/mL (ref <5)

## 2021-11-29 LAB — RESP PANEL BY RT-PCR (FLU A&B, COVID) ARPGX2
Influenza A by PCR: NEGATIVE
Influenza B by PCR: NEGATIVE
SARS Coronavirus 2 by RT PCR: NEGATIVE

## 2021-11-29 LAB — MAGNESIUM: Magnesium: 1.7 mg/dL (ref 1.7–2.4)

## 2021-11-29 LAB — LIPASE, BLOOD: Lipase: 27 U/L (ref 11–51)

## 2021-11-29 MED ORDER — POTASSIUM CHLORIDE CRYS ER 20 MEQ PO TBCR
40.0000 meq | EXTENDED_RELEASE_TABLET | Freq: Once | ORAL | Status: AC
  Administered 2021-11-29: 40 meq via ORAL
  Filled 2021-11-29: qty 2

## 2021-11-29 MED ORDER — EPINEPHRINE 0.3 MG/0.3ML IJ SOAJ: 0.3000 mg | INTRAMUSCULAR | 0 refills | Status: AC | PRN

## 2021-11-29 MED ORDER — FAMOTIDINE 20 MG PO TABS: 20.0000 mg | ORAL_TABLET | Freq: Two times a day (BID) | ORAL | 0 refills | Status: AC

## 2021-11-29 MED ORDER — EPINEPHRINE 0.3 MG/0.3ML IJ SOAJ
INTRAMUSCULAR | Status: AC
  Administered 2021-11-29: 0.3 mg
  Filled 2021-11-29: qty 0.3

## 2021-11-29 MED ORDER — SODIUM CHLORIDE 0.9 % IV BOLUS
1000.0000 mL | Freq: Once | INTRAVENOUS | Status: AC
  Administered 2021-11-29: 1000 mL via INTRAVENOUS

## 2021-11-29 MED ORDER — IOHEXOL 350 MG/ML SOLN: 100.0000 mL | Freq: Once | INTRAVENOUS | Status: DC | PRN

## 2021-11-29 MED ORDER — IPRATROPIUM-ALBUTEROL 0.5-2.5 (3) MG/3ML IN SOLN
3.0000 mL | Freq: Once | RESPIRATORY_TRACT | Status: AC
  Administered 2021-11-29: 3 mL via RESPIRATORY_TRACT
  Filled 2021-11-29: qty 3

## 2021-11-29 MED ORDER — POTASSIUM CHLORIDE CRYS ER 20 MEQ PO TBCR: 20.0000 meq | EXTENDED_RELEASE_TABLET | Freq: Every day | ORAL | 0 refills | Status: AC

## 2021-11-29 MED ORDER — SODIUM CHLORIDE (PF) 0.9 % IJ SOLN
INTRAMUSCULAR | Status: AC
  Filled 2021-11-29: qty 50

## 2021-11-29 MED ORDER — EPINEPHRINE 0.3 MG/0.3ML IJ SOAJ: 0.3000 mg | Freq: Once | INTRAMUSCULAR | Status: DC

## 2021-11-29 MED ORDER — ALBUTEROL SULFATE HFA 108 (90 BASE) MCG/ACT IN AERS: 2.0000 | INHALATION_SPRAY | RESPIRATORY_TRACT | 0 refills | Status: AC | PRN

## 2021-11-29 MED ORDER — ONDANSETRON 4 MG PO TBDP: 4.0000 mg | ORAL_TABLET | Freq: Three times a day (TID) | ORAL | 0 refills | Status: AC | PRN

## 2021-11-29 NOTE — ED Notes (Signed)
PT passed PO challenge and ambulated independently in hallway with steady gait.

## 2021-11-29 NOTE — ED Triage Notes (Signed)
Pt in from home via [facility] with sob, onset about 1hr PTA when she possibly aspirated on her vomit. Pt states she has been experiencing a GI bug x 3 days, n/v/d. Sats 86% on RA when EMS arrived, was given a duoneb and 125mg  Solumedrol en route, when she began to feel itchy and "throat swelling". Given 50mg  Benadryl IV and 4mg  zofran, hives and redness present to face on arrival

## 2021-11-29 NOTE — ED Provider Notes (Signed)
[facility] DEPT Provider Note   CSN: [id number] Arrival date & time: 11/29/21  0126     History  Chief Complaint  Patient presents with   Shortness of Breath   Allergic Reaction    [name] is a 41 y.o. female.  The history is provided by the patient, the EMS personnel and medical records.  [name] is a 41 y.o. female who presents to the Emergency Department complaining of sob.  She presents to the ED by EMS for evaluation of sob that started this evening.  She has been experiencing vomiting and diarrhea for the last four days described as numerous episodes.  This evening after vomiting she choked with concern for aspiration and developed sob.  EMS reports wheezing and hypoxia to mid 80s.  She was treated with albuterol, solumedrol prior to ED arrival.  Shortly after solumedrol administration pt developed itching to ears with redness to face and hand and EMS gave 50mg  benadryl IV.  Pt complains of ongoing sob.  No fever, abdominal pain.    Hx/o asthma.  Smokes tobacco.  No alcohol or drugs.       Home Medications Prior to Admission medications   Medication Sig Start Date End Date Taking? Authorizing Provider  albuterol (VENTOLIN HFA) 108 (90 Base) MCG/ACT inhaler Inhale 2 puffs into the lungs every 4 (four) hours as needed for wheezing or shortness of breath. 11/29/21  Yes Quintella Reichert, MD  EPINEPHrine 0.3 mg/0.3 mL IJ SOAJ injection Inject 0.3 mg into the muscle as needed for anaphylaxis. 11/29/21  Yes Quintella Reichert, MD  famotidine (PEPCID) 20 MG tablet Take 1 tablet (20 mg total) by mouth 2 (two) times daily. 11/29/21  Yes Quintella Reichert, MD  ondansetron (ZOFRAN-ODT) 4 MG disintegrating tablet Take 1 tablet (4 mg total) by mouth every 8 (eight) hours as needed for nausea or vomiting. 11/29/21  Yes Quintella Reichert, MD  potassium chloride SA (KLOR-CON M) 20 MEQ tablet Take 1 tablet (20 mEq total) by mouth daily. 11/29/21  Yes Quintella Reichert, MD  docusate sodium (COLACE) 100 MG capsule Take 1 capsule (100 mg total) by mouth daily as needed. For constipation 12/08/20   Lindell Spar I, NP  hydrOXYzine (ATARAX/VISTARIL) 25 MG tablet Take 1 tablet (25 mg total) by mouth 3 (three) times daily as needed for anxiety. 12/08/20   Lindell Spar I, NP  mirtazapine (REMERON) 15 MG tablet Take 1 tablet (15 mg total) by mouth at bedtime. For depression/sleep 12/08/20   Lindell Spar I, NP  nicotine (NICODERM CQ - DOSED IN MG/24 HOURS) 21 mg/24hr patch Place 1 patch (21 mg total) onto the skin daily. (May buy from over the counter): For smoking cessation 12/09/20   Lindell Spar I, NP  ondansetron (ZOFRAN) 4 MG tablet Take 1 tablet (4 mg total) by mouth every 6 (six) hours. 12/16/20   Curatolo, Adam, DO  pantoprazole (PROTONIX) 20 MG tablet Take 1 tablet (20 mg total) by mouth daily. 12/16/20 01/15/21  Curatolo, Adam, DO  polyethylene glycol (MIRALAX / GLYCOLAX) 17 g packet Take 17 g by mouth daily. For constipation 12/08/20   Lindell Spar I, NP  QUEtiapine (SEROQUEL) 200 MG tablet Take 1 tablet (200 mg total) by mouth at bedtime. For mood control 12/08/20   Lindell Spar I, NP  QUEtiapine (SEROQUEL) 50 MG tablet Take 1 tablet (50 mg total) by mouth daily. For agitation 12/09/20   Lindell Spar I, NP  sucralfate (CARAFATE) 1 g tablet Take  1 tablet (1 g total) by mouth 4 (four) times daily -  with meals and at bedtime for 14 days. 12/16/20 12/30/20  Curatolo, Adam, DO      Allergies    Iodinated contrast media, Codeine, Gadolinium derivatives, Haldol [haloperidol], Lamictal [lamotrigine], Methylprednisolone, Amoxicillin, Keflex [cephalexin], and Penicillins    Review of Systems   Review of Systems  All other systems reviewed and are negative.   Physical Exam Updated Vital Signs BP (!) 109/91 (BP Location: Left Arm)   Pulse 91   Temp (!) 97.5 F (36.4 C) (Oral)   Resp 16   Wt 68 kg   LMP 11/07/2021 Comment: negative beta HCG 11/29/21   SpO2 100%   BMI 24.96 kg/m  Physical Exam Vitals and nursing note reviewed.  Constitutional:      Appearance: She is well-developed.  HENT:     Head: Normocephalic and atraumatic.  Cardiovascular:     Rate and Rhythm: Regular rhythm. Tachycardia present.     Heart sounds: No murmur heard. Pulmonary:     Effort: Pulmonary effort is normal. No respiratory distress.     Comments: Wheezes and rhonchi bilaterally Abdominal:     Palpations: Abdomen is soft.     Tenderness: There is no guarding or rebound.     Comments: Mild generalized abdominal tenderness  Musculoskeletal:        General: No swelling or tenderness.  Skin:    General: Skin is warm and dry.     Comments: Ertyhema to face, hands, trunk.  Delayed cap refill in feet bilaterally with 2+ DP pulses.    Neurological:     Mental Status: She is alert and oriented to person, place, and time.  Psychiatric:        Behavior: Behavior normal.     ED Results / Procedures / Treatments   Labs (all labs ordered are listed, but only abnormal results are displayed) Labs Reviewed  COMPREHENSIVE METABOLIC PANEL - Abnormal; Notable for the following components:      Result Value   Potassium 2.8 (*)    CO2 19 (*)    Glucose, Bld 216 (*)    Calcium 8.6 (*)    AST 14 (*)    All other components within normal limits  CBC WITH DIFFERENTIAL/PLATELET - Abnormal; Notable for the following components:   WBC 19.5 (*)    RBC 6.08 (*)    Hemoglobin 18.9 (*)    HCT 55.5 (*)    Neutro Abs 13.5 (*)    Lymphs Abs 4.5 (*)    Abs Immature Granulocytes 0.14 (*)    All other components within normal limits  RESP PANEL BY RT-PCR (FLU A&B, COVID) ARPGX2  LIPASE, BLOOD  MAGNESIUM  I-STAT BETA HCG BLOOD, ED ([facility], [facility], [facility] ONLY)    EKG EKG Interpretation  Date/Time:  Wednesday November 29 2021 01:37:49 EDT Ventricular Rate:  117 PR Interval:  69 QRS Duration: 80 QT Interval:  395 QTC Calculation: 552 R Axis:   57 Text Interpretation: Sinus  tachycardia Prolonged QT interval Confirmed by Tilden Fossa [phone]) on 11/29/2021 2:22:20 AM  Radiology DG Chest Port 1 View  Result Date: 11/29/2021 CLINICAL DATA:  Shortness of breath EXAM: PORTABLE CHEST 1 VIEW COMPARISON:  11/29/2021 FINDINGS: The heart size and mediastinal contours are within normal limits. Both lungs are clear. The visualized skeletal structures are unremarkable. IMPRESSION: Negative. Electronically Signed   By: Charlett Nose M.D.   On: 11/29/2021 02:17    Procedures Procedures  CRITICAL CARE Performed by: Tilden Fossa   Total critical care time: 45 minutes  Critical care time was exclusive of separately billable procedures and treating other patients.  Critical care was necessary to treat or prevent imminent or life-threatening deterioration.  Critical care was time spent personally by me on the following activities: development of treatment plan with patient and/or surrogate as well as nursing, discussions with consultants, evaluation of patient's response to treatment, examination of patient, obtaining history from patient or surrogate, ordering and performing treatments and interventions, ordering and review of laboratory studies, ordering and review of radiographic studies, pulse oximetry and re-evaluation of patient's condition.  Medications Ordered in ED Medications  EPINEPHrine (EPI-PEN) 0.3 mg/0.3 mL injection (0.3 mg  Given 11/29/21 0137)  ipratropium-albuterol (DUONEB) 0.5-2.5 (3) MG/3ML nebulizer solution 3 mL (3 mLs Nebulization Given 11/29/21 0144)  potassium chloride SA (KLOR-CON M) CR tablet 40 mEq (40 mEq Oral Given 11/29/21 0545)  sodium chloride 0.9 % bolus 1,000 mL (0 mLs Intravenous Stopped 11/29/21 0947)    ED Course/ Medical Decision Making/ A&P                           Medical Decision Making Amount and/or Complexity of Data Reviewed Labs: ordered. Radiology: ordered.  Risk Prescription drug management.   Pt here with sob,  wheezing and hypoxia by EMS.  On arrival pt in respiratory distress with wheezing, rhonchi, increased oxygen requirement to 5L as well as diffuse erythema and delayed cap refill.  She was treated with epinephrine due to concern for anaphylaxis.  Unclear if inciting agent occurred at home (resulting in hypoxia/sob) or after solumedrol administration, as that is when her itching began.  She was continued on duoneb and supplemental oxygen.  CXR neg for acute abnormality - images personally reviewed and interpreted, agree with radiologist interpretation.  During ED stay patient continued to improve from a respiratory standpoint and oxygen was able to be titrated off.  On multiple repeat lung exams wheezing had resolved and pt with clear lungs without distress.  Patients erythema also resolved and cap refill returned to normal.    Labs significant for hypokalemia - replaced orally and patient's nausea was well controlled and she was able to tolerate po fluids without difficulty.  Current clinical picture is not c/w PE, status asthmaticus, ACS, pneumonia.    Discussed with patient likely allergic reaction triggering today's episode.  Feel she is stable for discharge with close follow up for recheck of her potassium.  Will Rx epi pen, potassium, ondansetron.  Discussed close return precautions for recurrent or worsening symptoms.  Discussed recommendation for further outpatient allergy testing as well to clarify potential triggers.          Final Clinical Impression(s) / ED Diagnoses Final diagnoses:  Shortness of breath  Hypokalemia  Anaphylaxis, initial encounter    Rx / DC Orders ED Discharge Orders          Ordered    EPINEPHrine 0.3 mg/0.3 mL IJ SOAJ injection  As needed        11/29/21 0542    famotidine (PEPCID) 20 MG tablet  2 times daily        11/29/21 0542    ondansetron (ZOFRAN-ODT) 4 MG disintegrating tablet  Every 8 hours PRN        11/29/21 0542    albuterol (VENTOLIN HFA) 108  (90 Base) MCG/ACT inhaler  Every 4 hours PRN  11/29/21 0543    potassium chloride SA (KLOR-CON M) 20 MEQ tablet  Daily        11/29/21 0093              Tilden Fossa, MD 11/29/21 2024

## 2021-11-29 NOTE — ED Notes (Signed)
Pt given sprite for PO challenge

## 2021-11-29 NOTE — Discharge Instructions (Signed)
You had a possible anaphylactic reaction to Solu-Medrol.  Please inform your doctor about this.  Please call your doctor for further evaluation and recheck.  Get evaluated in the emergency department if you have new or concerning symptoms.

## 2023-06-23 IMAGING — CT CT ABD-PELV W/ CM
2 of 5 series · 16 of 46 positions shown, 18 images · IV contrast (omnipaque)
Comparison: None.

CLINICAL DATA: Bowel obstruction suspected. Upper abdominal
bloating. Burping. Diarrhea.

EXAM:
CT ABDOMEN AND PELVIS WITH CONTRAST
TECHNIQUE: Multidetector CT imaging of the abdomen and pelvis was performed
using the standard protocol following bolus administration of
intravenous contrast.
CONTRAST:  80mL OMNIPAQUE IOHEXOL 350 MG/ML SOLN

[Series 2: axial st · axial · 0.93mm/px · z∈[-251,+139]mm · 13 of 92 slices shown, 15 images]
[im 7/92  soft-tissue]
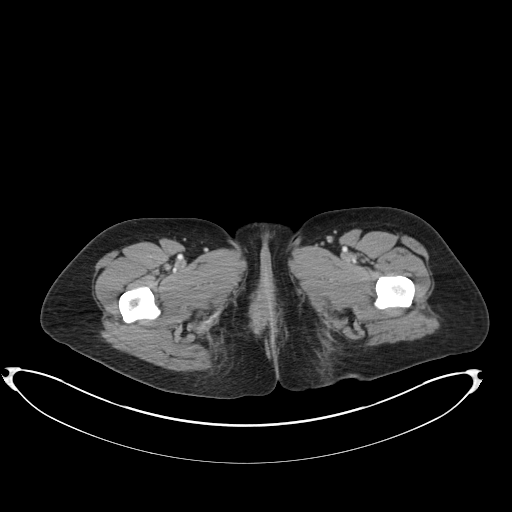
[im 7/92  bone]
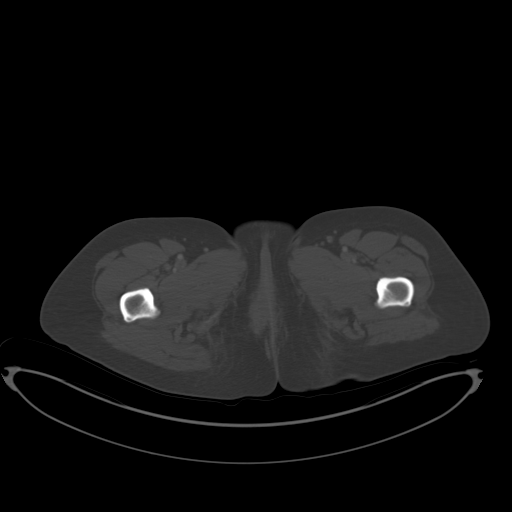
[im 14/92  soft-tissue]
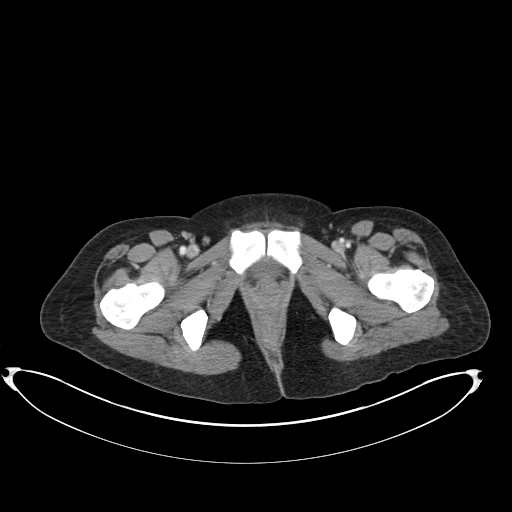
[im 20/92  soft-tissue]
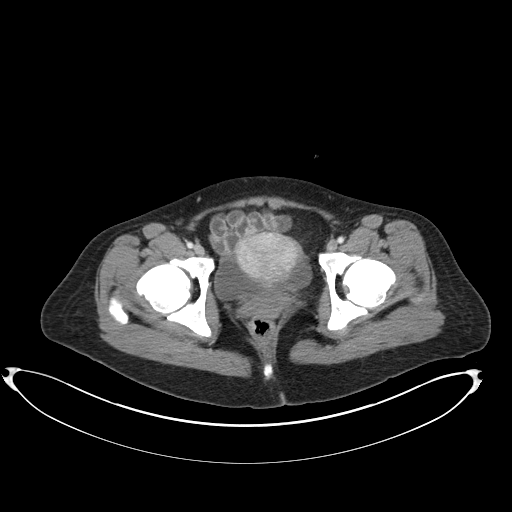
[im 27/92  soft-tissue]
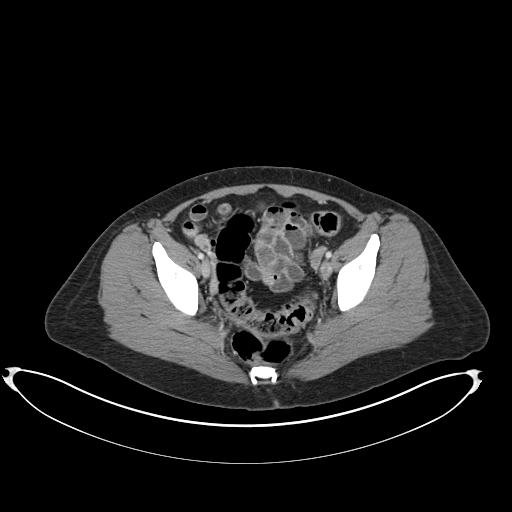
[im 33/92  soft-tissue]
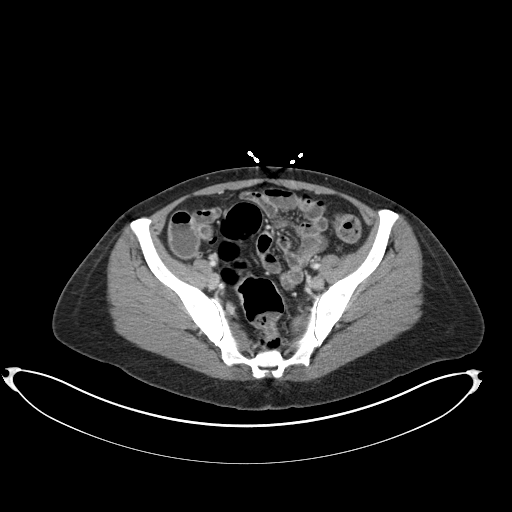
[im 40/92  soft-tissue]
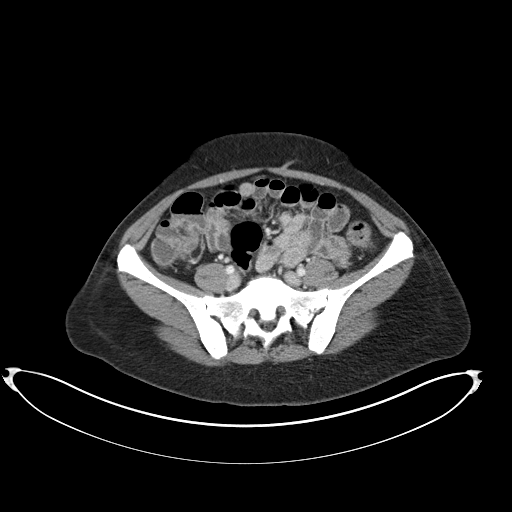
[im 46/92  soft-tissue]
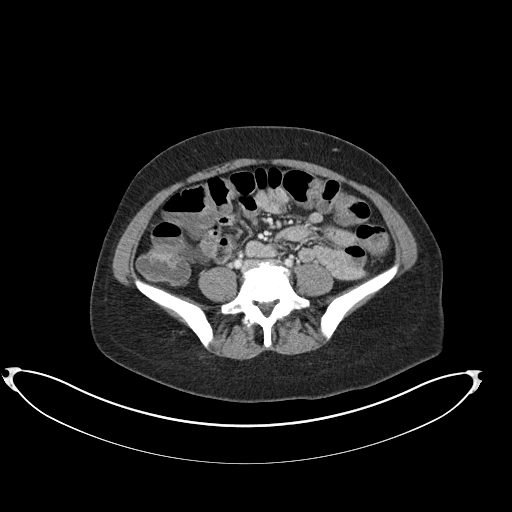
[im 53/92  soft-tissue]
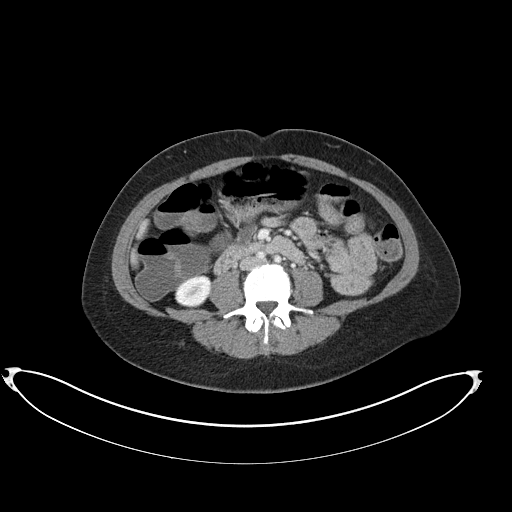
[im 59/92  soft-tissue]
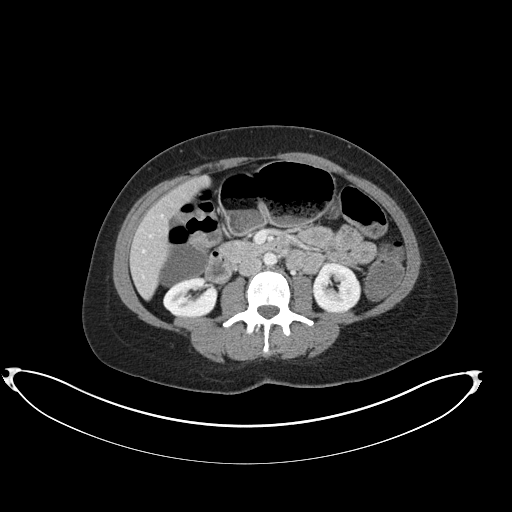
[im 59/92  bone]
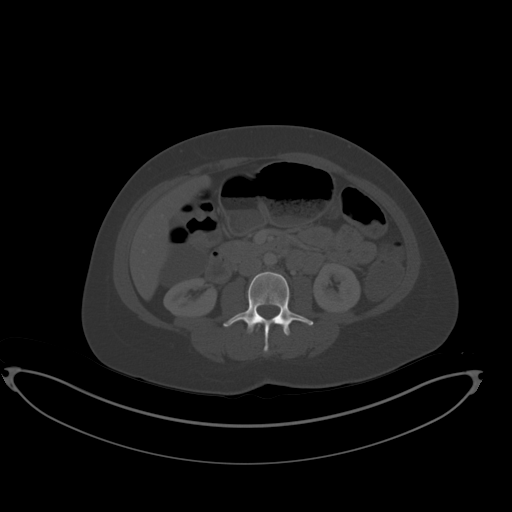
[im 66/92  soft-tissue]
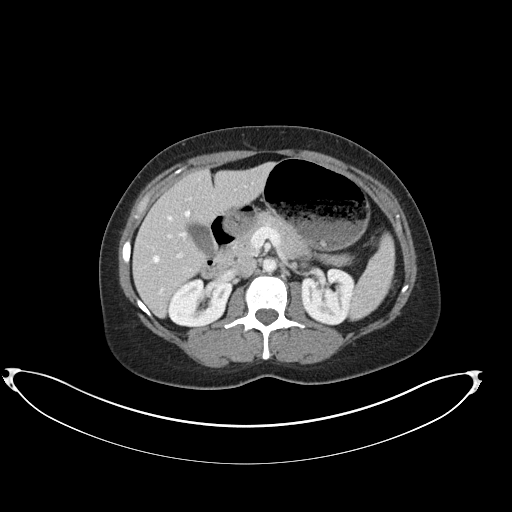
[im 72/92  soft-tissue]
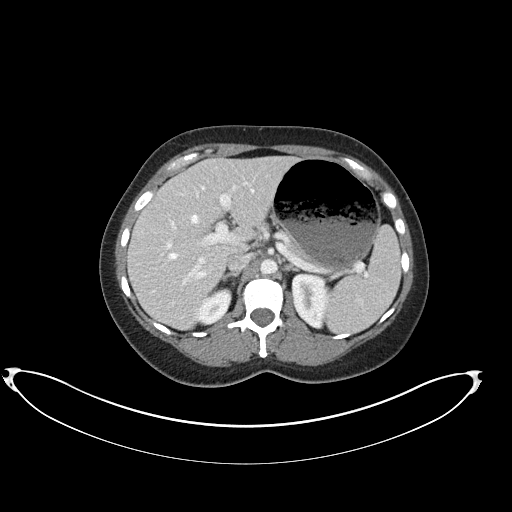
[im 79/92  soft-tissue]
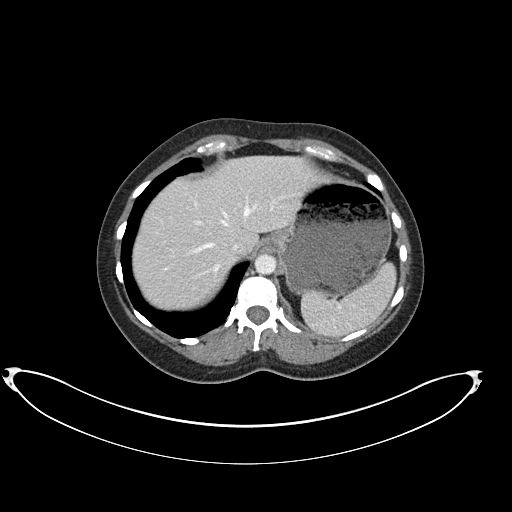
[im 85/92  soft-tissue]
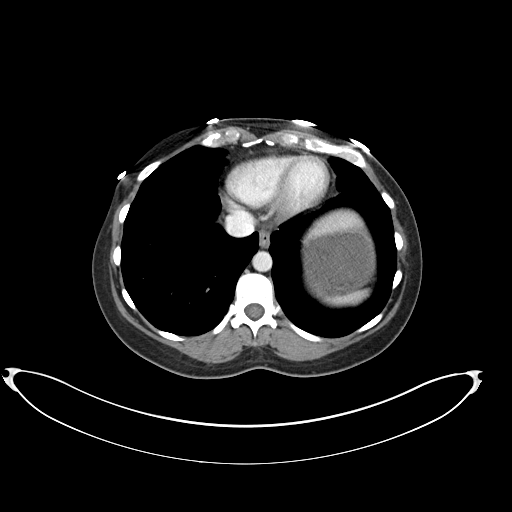

[Series 5: coronal st · coronal · 0.87mm/px · 3 of 160 slices shown]
[im 54/160  soft-tissue]
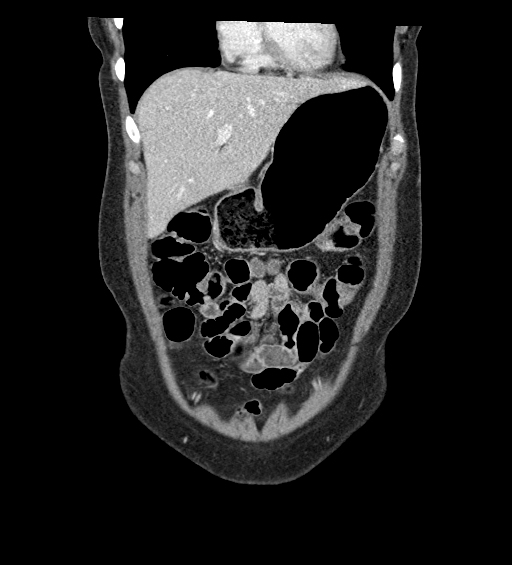
[im 71/160  soft-tissue]
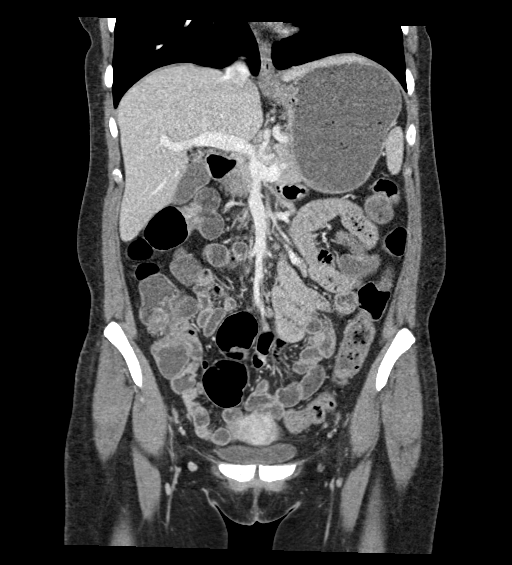
[im 89/160  soft-tissue]
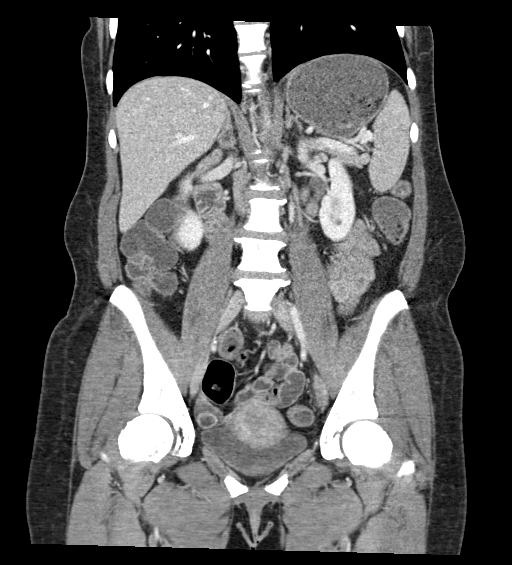

[16 of 46 positions shown; findings below may reference images not displayed]

FINDINGS: Lower chest: No acute abnormality.

Hepatobiliary: No focal liver abnormality is seen. No gallstones,
gallbladder wall thickening, or biliary dilatation.

Pancreas: Unremarkable. No pancreatic ductal dilatation or
surrounding inflammatory changes.

Spleen: Normal in size without focal abnormality.

Adrenals/Urinary Tract: Adrenal glands are unremarkable. Kidneys are
normal, without renal calculi, focal lesion, or hydronephrosis.
Bladder is unremarkable.

Stomach/Bowel: The stomach is mildly distended without evidence of
outlet obstruction. The small bowel is normal in caliber with no
abnormality. No small bowel obstruction. Mild fluid in the right
side of the colon without wall thickening consistent with history of
diarrhea. No evidence of obstruction. The appendix is normal.

Vascular/Lymphatic: No significant vascular findings are present. No
enlarged abdominal or pelvic lymph nodes.

Reproductive: There is a probable corpus luteum cyst in the right
ovary. The uterus and ovaries are otherwise normal.

Other: No abdominal wall hernia or abnormality. No abdominopelvic
ascites.

Musculoskeletal: No acute or significant osseous findings.
IMPRESSION: 1. Mild gaseous distention without evidence of outlet obstruction.
2. Fluid in the right colon consistent with history of diarrhea. No
wall thickening to suggest colitis.
3. No bowel obstruction.  The appendix is normal.
4. Corpus luteum cyst in the right ovary.
5. No other abnormalities.

## 2024-04-02 ENCOUNTER — Ambulatory Visit: Admitting: Dermatology
